# Patient Record
Sex: Female | Born: 1985 | Race: White | Hispanic: No | Marital: Married | Smoking: Never smoker
Health system: Southern US, Community
[De-identification: ages and names within clinical notes are randomized; demographics above are authoritative.]

## PROBLEM LIST (undated history)

## (undated) DIAGNOSIS — F329 Major depressive disorder, single episode, unspecified: Secondary | ICD-10-CM

## (undated) DIAGNOSIS — J302 Other seasonal allergic rhinitis: Secondary | ICD-10-CM

## (undated) DIAGNOSIS — R51 Headache: Secondary | ICD-10-CM

## (undated) DIAGNOSIS — E8881 Metabolic syndrome: Secondary | ICD-10-CM

## (undated) DIAGNOSIS — F419 Anxiety disorder, unspecified: Secondary | ICD-10-CM

## (undated) DIAGNOSIS — F32A Depression, unspecified: Secondary | ICD-10-CM

## (undated) DIAGNOSIS — K589 Irritable bowel syndrome without diarrhea: Secondary | ICD-10-CM

## (undated) DIAGNOSIS — E88819 Insulin resistance, unspecified: Secondary | ICD-10-CM

## (undated) DIAGNOSIS — I10 Essential (primary) hypertension: Secondary | ICD-10-CM

## (undated) DIAGNOSIS — N979 Female infertility, unspecified: Secondary | ICD-10-CM

## (undated) DIAGNOSIS — L509 Urticaria, unspecified: Secondary | ICD-10-CM

## (undated) HISTORY — DX: Female infertility, unspecified: N97.9

## (undated) HISTORY — DX: Major depressive disorder, single episode, unspecified: F32.9

## (undated) HISTORY — DX: Urticaria, unspecified: L50.9

## (undated) HISTORY — DX: Depression, unspecified: F32.A

## (undated) HISTORY — DX: Insulin resistance, unspecified: E88.819

## (undated) HISTORY — DX: Metabolic syndrome: E88.81

## (undated) HISTORY — PX: WISDOM TOOTH EXTRACTION: SHX21

## (undated) HISTORY — DX: Anxiety disorder, unspecified: F41.9

---

## 2005-07-22 ENCOUNTER — Ambulatory Visit: Payer: Self-pay | Admitting: Internal Medicine

## 2005-07-24 ENCOUNTER — Ambulatory Visit (HOSPITAL_COMMUNITY): Admission: RE | Admit: 2005-07-24 | Discharge: 2005-07-24 | Payer: Self-pay | Admitting: Internal Medicine

## 2005-08-19 ENCOUNTER — Ambulatory Visit: Payer: Self-pay | Admitting: *Deleted

## 2005-10-16 ENCOUNTER — Ambulatory Visit: Payer: Self-pay | Admitting: Family Medicine

## 2005-11-13 ENCOUNTER — Ambulatory Visit: Payer: Self-pay | Admitting: Family Medicine

## 2005-11-26 ENCOUNTER — Ambulatory Visit: Payer: Self-pay | Admitting: Family Medicine

## 2005-11-26 ENCOUNTER — Encounter: Payer: Self-pay | Admitting: Family Medicine

## 2005-12-24 ENCOUNTER — Ambulatory Visit: Payer: Self-pay | Admitting: Family Medicine

## 2005-12-31 ENCOUNTER — Ambulatory Visit: Payer: Self-pay | Admitting: Family Medicine

## 2006-02-27 ENCOUNTER — Ambulatory Visit: Payer: Self-pay | Admitting: Family Medicine

## 2006-03-26 ENCOUNTER — Ambulatory Visit: Payer: Self-pay | Admitting: Family Medicine

## 2006-04-09 ENCOUNTER — Ambulatory Visit: Payer: Self-pay | Admitting: Family Medicine

## 2006-07-30 ENCOUNTER — Ambulatory Visit: Payer: Self-pay | Admitting: Family Medicine

## 2006-08-25 ENCOUNTER — Ambulatory Visit: Payer: Self-pay | Admitting: Internal Medicine

## 2006-09-10 ENCOUNTER — Ambulatory Visit: Payer: Self-pay | Admitting: Family Medicine

## 2006-11-10 ENCOUNTER — Ambulatory Visit: Payer: Self-pay | Admitting: Family Medicine

## 2007-02-09 ENCOUNTER — Ambulatory Visit: Payer: Self-pay | Admitting: Family Medicine

## 2007-03-30 ENCOUNTER — Ambulatory Visit: Payer: Self-pay | Admitting: Family Medicine

## 2007-06-23 ENCOUNTER — Telehealth (INDEPENDENT_AMBULATORY_CARE_PROVIDER_SITE_OTHER): Payer: Self-pay | Admitting: *Deleted

## 2007-06-23 ENCOUNTER — Encounter (INDEPENDENT_AMBULATORY_CARE_PROVIDER_SITE_OTHER): Payer: Self-pay | Admitting: *Deleted

## 2007-11-22 ENCOUNTER — Emergency Department (HOSPITAL_COMMUNITY): Admission: EM | Admit: 2007-11-22 | Discharge: 2007-11-22 | Payer: Self-pay | Admitting: Family Medicine

## 2008-05-08 ENCOUNTER — Telehealth (INDEPENDENT_AMBULATORY_CARE_PROVIDER_SITE_OTHER): Payer: Self-pay | Admitting: Family Medicine

## 2008-06-06 ENCOUNTER — Ambulatory Visit: Payer: Self-pay | Admitting: Family Medicine

## 2008-06-06 DIAGNOSIS — K589 Irritable bowel syndrome without diarrhea: Secondary | ICD-10-CM | POA: Insufficient documentation

## 2008-06-06 DIAGNOSIS — L708 Other acne: Secondary | ICD-10-CM | POA: Insufficient documentation

## 2008-07-11 ENCOUNTER — Ambulatory Visit: Payer: Self-pay | Admitting: Family Medicine

## 2008-07-11 ENCOUNTER — Encounter (INDEPENDENT_AMBULATORY_CARE_PROVIDER_SITE_OTHER): Payer: Self-pay | Admitting: Family Medicine

## 2008-07-11 LAB — CONVERTED CEMR LAB
Chlamydia, DNA Probe: NEGATIVE
GC Probe Amp, Genital: NEGATIVE
Glucose, Urine, Semiquant: NEGATIVE
Nitrite: NEGATIVE
Protein, U semiquant: 30
Specific Gravity, Urine: 1.015
Urobilinogen, UA: 1
pH: 6.5

## 2008-07-14 ENCOUNTER — Telehealth (INDEPENDENT_AMBULATORY_CARE_PROVIDER_SITE_OTHER): Payer: Self-pay | Admitting: *Deleted

## 2008-07-19 ENCOUNTER — Encounter (INDEPENDENT_AMBULATORY_CARE_PROVIDER_SITE_OTHER): Payer: Self-pay | Admitting: Family Medicine

## 2008-08-08 ENCOUNTER — Encounter (INDEPENDENT_AMBULATORY_CARE_PROVIDER_SITE_OTHER): Payer: Self-pay | Admitting: *Deleted

## 2008-10-09 ENCOUNTER — Telehealth (INDEPENDENT_AMBULATORY_CARE_PROVIDER_SITE_OTHER): Payer: Self-pay | Admitting: *Deleted

## 2008-10-24 ENCOUNTER — Ambulatory Visit: Payer: Self-pay | Admitting: Family Medicine

## 2008-10-24 DIAGNOSIS — G43909 Migraine, unspecified, not intractable, without status migrainosus: Secondary | ICD-10-CM | POA: Insufficient documentation

## 2008-10-24 DIAGNOSIS — J329 Chronic sinusitis, unspecified: Secondary | ICD-10-CM | POA: Insufficient documentation

## 2009-07-03 ENCOUNTER — Ambulatory Visit: Payer: Self-pay | Admitting: Physician Assistant

## 2009-07-03 DIAGNOSIS — R5383 Other fatigue: Secondary | ICD-10-CM | POA: Insufficient documentation

## 2009-07-03 LAB — CONVERTED CEMR LAB
ALT: 30 units/L (ref 0–35)
AST: 30 units/L (ref 0–37)
Albumin: 4.5 g/dL (ref 3.5–5.2)
Alkaline Phosphatase: 82 units/L (ref 39–117)
BUN: 15 mg/dL (ref 6–23)
Basophils Absolute: 0 10*3/uL (ref 0.0–0.1)
Basophils Relative: 0 % (ref 0–1)
Blood Glucose, Fingerstick: 99
CO2: 24 meq/L (ref 19–32)
Calcium: 9.8 mg/dL (ref 8.4–10.5)
Chloride: 104 meq/L (ref 96–112)
Creatinine, Ser: 0.81 mg/dL (ref 0.40–1.20)
Eosinophils Absolute: 0.2 10*3/uL (ref 0.0–0.7)
Eosinophils Relative: 2 % (ref 0–5)
Glucose, Bld: 83 mg/dL (ref 70–99)
HCT: 38.9 % (ref 36.0–46.0)
Hemoglobin: 13.2 g/dL (ref 12.0–15.0)
Hgb A1c MFr Bld: 5.4 % (ref 4.6–6.1)
Lymphocytes Relative: 26 % (ref 12–46)
Lymphs Abs: 2.4 10*3/uL (ref 0.7–4.0)
MCHC: 33.9 g/dL (ref 30.0–36.0)
MCV: 87.8 fL (ref 78.0–100.0)
Monocytes Absolute: 0.7 10*3/uL (ref 0.1–1.0)
Monocytes Relative: 8 % (ref 3–12)
Neutro Abs: 6.1 10*3/uL (ref 1.7–7.7)
Neutrophils Relative %: 64 % (ref 43–77)
Platelets: 277 10*3/uL (ref 150–400)
Potassium: 4.7 meq/L (ref 3.5–5.3)
RBC: 4.43 M/uL (ref 3.87–5.11)
RDW: 12.3 % (ref 11.5–15.5)
Rapid HIV Screen: NEGATIVE
Sodium: 142 meq/L (ref 135–145)
TSH: 0.776 microintl units/mL (ref 0.350–4.500)
Total Bilirubin: 0.6 mg/dL (ref 0.3–1.2)
Total Protein: 7.3 g/dL (ref 6.0–8.3)
WBC: 9.4 10*3/uL (ref 4.0–10.5)

## 2009-07-04 ENCOUNTER — Encounter: Payer: Self-pay | Admitting: Physician Assistant

## 2009-08-02 ENCOUNTER — Ambulatory Visit: Payer: Self-pay | Admitting: Physician Assistant

## 2009-08-02 LAB — CONVERTED CEMR LAB: KOH Prep: NEGATIVE

## 2009-08-03 LAB — CONVERTED CEMR LAB
Chlamydia, DNA Probe: NEGATIVE
GC Probe Amp, Genital: NEGATIVE

## 2009-08-28 ENCOUNTER — Telehealth: Payer: Self-pay | Admitting: Physician Assistant

## 2009-09-16 ENCOUNTER — Encounter: Payer: Self-pay | Admitting: Physician Assistant

## 2009-10-02 ENCOUNTER — Ambulatory Visit: Payer: Self-pay | Admitting: Physician Assistant

## 2009-12-13 ENCOUNTER — Ambulatory Visit: Payer: Self-pay | Admitting: Physician Assistant

## 2009-12-13 DIAGNOSIS — R21 Rash and other nonspecific skin eruption: Secondary | ICD-10-CM | POA: Insufficient documentation

## 2009-12-13 DIAGNOSIS — N979 Female infertility, unspecified: Secondary | ICD-10-CM

## 2009-12-13 HISTORY — DX: Female infertility, unspecified: N97.9

## 2009-12-17 LAB — CONVERTED CEMR LAB
Anti Nuclear Antibody(ANA): NEGATIVE
Sed Rate: 16 mm/hr (ref 0–22)

## 2009-12-18 ENCOUNTER — Encounter: Payer: Self-pay | Admitting: Physician Assistant

## 2009-12-31 ENCOUNTER — Ambulatory Visit: Payer: Self-pay | Admitting: Physician Assistant

## 2010-01-22 ENCOUNTER — Ambulatory Visit: Payer: Self-pay | Admitting: Physician Assistant

## 2010-01-22 ENCOUNTER — Encounter (INDEPENDENT_AMBULATORY_CARE_PROVIDER_SITE_OTHER): Payer: Self-pay | Admitting: Internal Medicine

## 2010-03-08 ENCOUNTER — Encounter: Payer: Self-pay | Admitting: Physician Assistant

## 2010-04-01 ENCOUNTER — Telehealth: Payer: Self-pay | Admitting: Physician Assistant

## 2010-04-01 ENCOUNTER — Ambulatory Visit: Payer: Self-pay | Admitting: Physician Assistant

## 2010-08-20 ENCOUNTER — Ambulatory Visit: Payer: Self-pay | Admitting: Nurse Practitioner

## 2010-08-20 DIAGNOSIS — J309 Allergic rhinitis, unspecified: Secondary | ICD-10-CM | POA: Insufficient documentation

## 2010-10-11 ENCOUNTER — Encounter (INDEPENDENT_AMBULATORY_CARE_PROVIDER_SITE_OTHER): Payer: Self-pay | Admitting: Nurse Practitioner

## 2010-10-11 ENCOUNTER — Ambulatory Visit
Admission: RE | Admit: 2010-10-11 | Discharge: 2010-10-11 | Payer: Self-pay | Source: Home / Self Care | Attending: Nurse Practitioner | Admitting: Nurse Practitioner

## 2010-10-11 LAB — CONVERTED CEMR LAB
ALT: 24 units/L (ref 0–35)
AST: 22 units/L (ref 0–37)
Albumin: 4.7 g/dL (ref 3.5–5.2)
BUN: 11 mg/dL (ref 6–23)
Basophils Absolute: 0 10*3/uL (ref 0.0–0.1)
Basophils Relative: 0 % (ref 0–1)
Bilirubin Urine: NEGATIVE
Blood in Urine, dipstick: NEGATIVE
CO2: 23 meq/L (ref 19–32)
Calcium: 9.8 mg/dL (ref 8.4–10.5)
Chlamydia, DNA Probe: NEGATIVE
Chloride: 103 meq/L (ref 96–112)
Cholesterol: 157 mg/dL (ref 0–200)
Creatinine, Ser: 0.81 mg/dL (ref 0.40–1.20)
Eosinophils Relative: 1 % (ref 0–5)
GC Probe Amp, Genital: NEGATIVE
Glucose, Bld: 91 mg/dL (ref 70–99)
HCT: 41.8 % (ref 36.0–46.0)
HDL: 49 mg/dL (ref >39)
Hemoglobin: 14.1 g/dL (ref 12.0–15.0)
KOH Prep: NEGATIVE
Ketones, urine, test strip: NEGATIVE
LDL Cholesterol: 81 mg/dL (ref 0–99)
Lymphocytes Relative: 21 % (ref 12–46)
Lymphs Abs: 2.1 10*3/uL (ref 0.7–4.0)
MCHC: 33.7 g/dL (ref 30.0–36.0)
Monocytes Relative: 4 % (ref 3–12)
Neutro Abs: 7.2 10*3/uL (ref 1.7–7.7)
Neutrophils Relative %: 73 % (ref 43–77)
Nitrite: NEGATIVE
Platelets: 295 10*3/uL (ref 150–400)
Potassium: 4.6 meq/L (ref 3.5–5.3)
Protein, U semiquant: NEGATIVE
RBC: 4.7 M/uL (ref 3.87–5.11)
RDW: 13.1 % (ref 11.5–15.5)
Rapid HIV Screen: NEGATIVE
Sodium: 138 meq/L (ref 135–145)
Specific Gravity, Urine: 1.025
TSH: 1.066 microintl units/mL (ref 0.350–4.500)
Total Bilirubin: 0.7 mg/dL (ref 0.3–1.2)
Total CHOL/HDL Ratio: 3.2
Total Protein: 7.9 g/dL (ref 6.0–8.3)
Triglycerides: 133 mg/dL (ref <150)
Urobilinogen, UA: 0.2
VLDL: 27 mg/dL (ref 0–40)
WBC Urine, dipstick: NEGATIVE
pH: 6

## 2010-10-15 ENCOUNTER — Encounter (INDEPENDENT_AMBULATORY_CARE_PROVIDER_SITE_OTHER): Payer: Self-pay | Admitting: Nurse Practitioner

## 2010-11-07 NOTE — Letter (Signed)
Summary: [facility]   Imported By: Arta Bruce 09/18/2010 16:54:55  _____________________________________________________________________  External Attachment:    Type:   Image     Comment:   External Document

## 2010-11-07 NOTE — Assessment & Plan Note (Signed)
Summary: Complete Physical Exam   Vital Signs:  Patient profile:   25 year old female Menstrual status:  regular LMP:     09/27/2010 Weight:      182.0 pounds Temp:     99.4 degrees F oral Pulse rate:   76 / minute Pulse rhythm:   regular Resp:     20 per minute BP sitting:   130 / 100  (left arm) Cuff size:   regular  Vitals Entered By: Levon Hedger (October 11, 2010 9:10 AM) CC: CPP, Headache Is Patient Diabetic? No Pain Assessment Patient in pain? no       Does patient need assistance? Functional Status Self care Ambulation Normal  Vision Screening:Left eye w/o correction: 20 / 20-1 Right Eye w/o correction: 20 / 25-1 Both eyes w/o correction:  20/ 15        Vision Entered By: Levon Hedger (October 11, 2010 9:31 AM) LMP (date): 09/27/2010 LMP - Character: normal Menarche (age onset years): 10    Menstrual flow (days): 3 Menstrual Status regular Enter LMP: 09/27/2010 Last PAP Result  Specimen Adequacy: Satisfactory for evaluation.   Interpretation/Result:Negative for intraepithelial Lesion or Malignancy.      Primary Care Provider:  Tereso Newcomer PA-C  CC:  CPP and Headache.  History of Present Illness:  Pt into the office for a complete physical exam  PAP - last pap done 07/2008 with normal results no children but she has been trying for the past 3 years. Pt is sexually, no current birth control.  Married for 4 years. No condom use. Menses - monthly.  Started at age 67.  regular every month  Mammogram - great aunt with breast cancer no history of mammogram self breast exams at home  Optho - no glasses or contacts.  last eye exam was at age 39 no problems with eyes at this time  Dental - seen at [facility] clinic in November  Headache HPI:      The headaches generally will last anywhere from 15 minutes to 2 hours at a time.  She has approximately 5+ headaches per month.        The headaches are not associated with an aura.  The  location of the headaches are bilateral.        Additional history: Migraine about twice per month but every other day she has a nagging headache. No artificial sweetners.     Habits & Providers  Alcohol-Tobacco-Diet     Alcohol drinks/day: 0     Tobacco Status: never     Passive Smoke Exposure: no  Exercise-Depression-Behavior     Does Patient Exercise: yes     Type of exercise: walking     Times/week: <3     Have you felt down or hopeless? no     Have you felt little pleasure in things? no     Depression Counseling: not indicated; screening negative for depression     STD Risk: never     Drug Use: never     Seat Belt Use: always     Sun Exposure: occasionally  Comments: PHQ-9 score = 5  Allergies (verified): No Known Drug Allergies  Review of Systems General:  Complains of fatigue; denies fever and sleep disorder; drinks 1 cup of coffee daily . Eyes:  Denies blurring. ENT:  Denies earache. CV:  Denies chest pain or discomfort. Resp:  Denies cough. GI:  Denies abdominal pain. GU:  Denies abnormal vaginal bleeding. MS:  Denies joint pain. Derm:  Denies dryness. Neuro:  Complains of headaches. Psych:  Denies anxiety and depression. Endo:  Denies excessive urination.  Physical Exam  General:  alert.   Head:  normocephalic.   Eyes:  pupils round.   Ears:  bil TM with bony landmarkes present Nose:  no nasal discharge.   Mouth:  fair dentition.   Neck:  supple.   Chest Wall:  no mass.   Breasts:  no masses.   Lungs:  normal breath sounds.   Heart:  normal rate and regular rhythm.   Abdomen:  normal bowel sounds.   Rectal:  defer Msk:  up to the exam table Pulses:  R radial normal and L radial normal.   Extremities:  no edema Neurologic:  gait normal.   Skin:  color normal.   Psych:  Oriented X3.    Pelvic Exam  Vulva:      normal appearance.   Urethra and Bladder:      Urethra--normal.   Vagina:      physiologic discharge.   Cervix:       midposition.   Uterus:      anteverted.   Adnexa:      nontender bilaterally.   Rectum:      defer    Impression & Recommendations:  Problem # 1:  ROUTINE GYNECOLOGICAL EXAMINATION (ICD-V72.31) rec optho  dental up to date labs done PAP done Orders: T-Comprehensive Metabolic Panel [phone]) T-Lipid Profile ([id number]) T-CBC w/Diff (29562-13086) Rapid HIV  (57846) T-Syphilis Test (RPR) [phone]) T- GC Chlamydia (24401) T-TSH (02725-36644) Vision Screening (03474)  Problem # 2:  FATIGUE (ICD-780.79) will check for anemia  Problem # 3:  NEED PROPHYLACTIC VACCINATION&INOCULATION FLU (ICD-V04.81) given today in office  Complete Medication List: 1)  Retin-a 0.025 % Crea (Tretinoin) .... Apply to clean,dry face at bedtime 2)  One-a-day Extras Antioxidant Caps (Multiple vitamins-minerals) .... Take one by mouth daily 3)  Fish Oil Double Strength 1200 Mg Caps (Omega-3 fatty acids) .... Once daily 4)  Benzoyl Peroxide 2.5 % Gel (Benzoyl peroxide) .... Apply every morning 5)  Nasacort Aq 55 Mcg/act Aers (Triamcinolone acetonide) .... One spray in each nostril two times a day 6)  Loratadine 10 Mg Tabs (Loratadine) .... One tablet by mouth daily 7)  Cleocin-t 1 % Gel (Clindamycin phosphate) .... Apply to clean, dry face twice daily  Patient Instructions: 1)  Sleep - try to take some melatonin over the counter to help with sleep 2)  Fatigue - your labs will be checked for anemia 3)  You may also consider taking some over the counter vitamin D 1000 units by mouth two times a day as this is sometimes the cause for fatigue 4)  You will be notified of any abnormal labs 5)  You have been given the flu vaccine today 6)  Follow up yearly or as needed   Orders Added: 1)  T-Comprehensive Metabolic Panel [[id number]] 2)  T-Lipid Profile [80061-22930] 3)  T-CBC w/Diff [25956-38756] 4)  Rapid HIV  [92370] 5)  T-Syphilis Test (RPR) [43329-51884] 6)  T- GC Chlamydia  [16606] 7)  T-TSH [30160-10932] 8)  Est. Patient age 63-39 [84] 9)  Vision Screening [phone]    Laboratory Results   Urine Tests  Date/Time Received: October 11, 2010 9:29 AM   Routine Urinalysis   Color: lt. yellow Appearance: Clear Glucose: negative   (Normal Range: Negative) Bilirubin: negative   (Normal Range: Negative) Ketone: negative   (Normal Range: Negative) Spec. Gravity:  1.025   (Normal Range: 1.003-1.035) Blood: negative   (Normal Range: Negative) pH: 6.0   (Normal Range: 5.0-8.0) Protein: negative   (Normal Range: Negative) Urobilinogen: 0.2   (Normal Range: 0-1) Nitrite: negative   (Normal Range: Negative) Leukocyte Esterace: negative   (Normal Range: Negative)    Date/Time Received: October 11, 2010 10:41 AM   [place]et [place]/KOH Source: vaginal WBC/hpf: 1-5 Bacteria/hpf: rare Clue cells/hpf: none Yeast/hpf: none Trichomonas/hpf: none  Other Tests  Rapid HIV: negative    Prevention & Chronic Care Immunizations   Influenza vaccine: Fluvax 3+  (10/24/2008)    Tetanus booster: 11/13/2005: Td    Pneumococcal vaccine: Not documented  Other Screening   Pap smear:  Specimen Adequacy: Satisfactory for evaluation.   Interpretation/Result:Negative for intraepithelial Lesion or Malignancy.     (08/02/2009)   Smoking status: never  (10/11/2010)   Nursing Instructions: Give Flu vaccine today    Laboratory Results   Urine Tests    Routine Urinalysis   Color: lt. yellow Appearance: Clear Glucose: negative   (Normal Range: Negative) Bilirubin: negative   (Normal Range: Negative) Ketone: negative   (Normal Range: Negative) Spec. Gravity: 1.025   (Normal Range: 1.003-1.035) Blood: negative   (Normal Range: Negative) pH: 6.0   (Normal Range: 5.0-8.0) Protein: negative   (Normal Range: Negative) Urobilinogen: 0.2   (Normal Range: 0-1) Nitrite: negative   (Normal Range: Negative) Leukocyte Esterace: negative   (Normal Range: Negative)       Wet [place] Wet [place] KOH: Negative  Other Tests  Rapid HIV: negative   Appended Document: Complete Physical Exam     Allergies: No Known Drug Allergies   Complete Medication List: 1)  Retin-a 0.025 % Crea (Tretinoin) .... Apply to clean,dry face at bedtime 2)  One-a-day Extras Antioxidant Caps (Multiple vitamins-minerals) .... Take one by mouth daily 3)  Fish Oil Double Strength 1200 Mg Caps (Omega-3 fatty acids) .... Once daily 4)  Benzoyl Peroxide 2.5 % Gel (Benzoyl peroxide) .... Apply every morning 5)  Nasacort Aq 55 Mcg/act Aers (Triamcinolone acetonide) .... One spray in each nostril two times a day 6)  Loratadine 10 Mg Tabs (Loratadine) .... One tablet by mouth daily 7)  Cleocin-t 1 % Gel (Clindamycin phosphate) .... Apply to clean, dry face twice daily  Other Orders: Flu Vaccine 72yrs + (01027) Admin 1st Vaccine (25366)   Orders Added: 1)  Flu Vaccine 21yrs + [44034] 2)  Admin 1st Vaccine [74259]   Immunizations Administered:  Influenza Vaccine # 1:    Vaccine Type: Fluvax 3+    Site: left deltoid    Mfr: GlaxoSmithKline    Dose: 0.5 ml    Route: IM    Given by: Levon Hedger    Exp. Date: 04/05/2011    Lot #: [id number]    VIS given: 04/30/10 version given October 11, 2010.  Flu Vaccine Consent Questions:    Do you have a history of severe allergic reactions to this vaccine? no    Any prior history of allergic reactions to egg and/or gelatin? no    Do you have a sensitivity to the preservative Thimersol? no    Do you have a past history of Guillan-Barre Syndrome? no    Do you currently have an acute febrile illness? no    Have you ever had a severe reaction to latex? no    Vaccine information given and explained to patient? yes    Are you currently pregnant? no    ndc  [phone]  Immunizations Administered:  Influenza Vaccine # 1:    Vaccine Type: Fluvax 3+    Site: left deltoid    Mfr: GlaxoSmithKline    Dose: 0.5 ml    Route: IM     Given by: Levon Hedger    Exp. Date: 04/05/2011    Lot #: [id number]    VIS given: 04/30/10 version given October 11, 2010.

## 2010-11-07 NOTE — Letter (Signed)
Summary: GYNECOLOGIC CYTOLOGY REPORT  GYNECOLOGIC CYTOLOGY REPORT   Imported By: Arta Bruce 11/19/2009 15:17:35  _____________________________________________________________________  External Attachment:    Type:   Image     Comment:   External Document

## 2010-11-07 NOTE — Assessment & Plan Note (Signed)
Summary: Allergic Rhinitis   Vital Signs:  Patient profile:   25 year old female LMP:     08/06/2010 Weight:      186.5 pounds BMI:     32.13 Temp:     97.5 degrees F oral Pulse rate:   80 / minute Pulse rhythm:   regular Resp:     20 per minute BP sitting:   130 / 90  (left arm) Cuff size:   regular  Vitals Entered By: Levon Hedger (August 20, 2010 10:43 AM)  Nutrition Counseling: Patient's BMI is greater than 25 and therefore counseled on weight management options. CC: sinus pressure and pain...had a cold a couple of weeks ago and now the sinus pressure and pain won't go away Is Patient Diabetic? No  Does patient need assistance? Functional Status Self care Ambulation Normal LMP (date): 08/06/2010 LMP - Character: normal Menarche (age onset years): 10    Menstrual flow (days): 3 Enter LMP: 08/06/2010 Last PAP Result  Specimen Adequacy: Satisfactory for evaluation.   Interpretation/Result:Negative for intraepithelial Lesion or Malignancy.      Primary Care Deshon Koslowski:  Tereso Newcomer PA-C  CC:  sinus pressure and pain...had a cold a couple of weeks ago and now the sinus pressure and pain won't go away.  History of Present Illness:  Pt into the office with complaints of sinus pressure. Started with cold symptoms about 2 weeks ago for which she took over the counter nyquil and dayquil with relief of symptoms. However currently she has pressure behind her eyes +nasal congestion -sore throat -fever Pt has been using her netti pot at home and it is beneficial for about 30 minutes and then congestion returns  Obesity - down 7 pounds since her last visit.  She has been trying to increase her activity  Habits & Providers  Alcohol-Tobacco-Diet     Alcohol drinks/day: 0     Tobacco Status: never     Passive Smoke Exposure: no  Exercise-Depression-Behavior     Does Patient Exercise: yes     Type of exercise: walking     Times/week: <3     STD Risk: never  Drug Use: never     Seat Belt Use: always     Sun Exposure: occasionally  Allergies (verified): No Known Drug Allergies  Review of Systems General:  Denies fatigue and weakness. ENT:  Complains of nasal congestion; denies sore throat. CV:  Denies chest pain or discomfort. Resp:  Denies cough and wheezing. GI:  Denies nausea and vomiting.  Physical Exam  General:  alert.   Head:  normocephalic.   Ears:  bil TM with clear fluid present Nose:  turbinates inflammed Mouth:  pharynx pink and moist.   Lungs:  normal breath sounds.   Heart:  normal rate and regular rhythm.   Abdomen:  normal bowel sounds.   Msk:  up to the exam table Neurologic:  alert & oriented X3.     Impression & Recommendations:  Problem # 1:  ALLERGIC RHINITIS (ICD-477.9)  advised pt to continue to use her netti pot in between medication dosage will order nasal spray and loratadine 10mg  by mouth daily  Her updated medication list for this problem includes:    Nasacort Aq 55 Mcg/act Aers (Triamcinolone acetonide) ..... One spray in each nostril two times a day    Loratadine 10 Mg Tabs (Loratadine) ..... One tablet by mouth daily  Complete Medication List: 1)  Retin-a 0.025 % Crea (Tretinoin) .... Apply to clean,dry face  at bedtime 2)  One-a-day Extras Antioxidant Caps (Multiple vitamins-minerals) .... Take one by mouth daily 3)  Fish Oil Double Strength 1200 Mg Caps (Omega-3 fatty acids) .... Once daily 4)  Benzoyl Peroxide 2.5 % Gel (Benzoyl peroxide) .... Apply every morning 5)  Nasacort Aq 55 Mcg/act Aers (Triamcinolone acetonide) .... One spray in each nostril two times a day 6)  Loratadine 10 Mg Tabs (Loratadine) .... One tablet by mouth daily  Patient Instructions: 1)  You may continue to use the Netti pot 2)  Nasal spray - no samples available in office. 3)  start nasacort - 1 spray in each nostril two times a day (hold head down) 4)  Also take loratadine 10mg  by mouth daily  5)  Follow up as  needed Prescriptions: LORATADINE 10 MG TABS (LORATADINE) One tablet by mouth daily  #30 x 1   Entered and Authorized by:   Lehman Prom FNP   Signed by:   Lehman Prom FNP on 08/20/2010   Method used:   Print then Give to Patient   RxID:   [id number] NASACORT AQ 55 MCG/ACT AERS (TRIAMCINOLONE ACETONIDE) One spray in each nostril two times a day  #1 x 1   Entered and Authorized by:   Lehman Prom FNP   Signed by:   Lehman Prom FNP on 08/20/2010   Method used:   Print then Give to Patient   RxID:   [id number]    Orders Added: 1)  Est. Patient Level III [25366]

## 2010-11-07 NOTE — Assessment & Plan Note (Signed)
Summary: PIMPLE FACE//GK   Vital Signs:  Patient profile:   25 year old female Height:      64 inches Weight:      193 pounds BMI:     33.25 Temp:     98.4 degrees F oral Pulse rate:   94 / minute Pulse rhythm:   regular Resp:     18 per minute BP sitting:   127 / 88  (left arm) Cuff size:   regular  Vitals Entered By: [place] Shannon (April 01, 2010 4:30 PM) CC: pt is here for pimples... Is Patient Diabetic? No Pain Assessment Patient in pain? no       Does patient need assistance? Functional Status Self care Ambulation Normal   Primary Care Provider:  Tereso Newcomer PA-C  CC:  pt is here for pimples....  History of Present Illness: Here for acne.  Did go to derm clinic.  Cleared up after she was off Cleocin for a while.  Was told she had some type of allergy.  She never used because it went away.  Gets more severe acne.  Started using Cleocin a few weeks ago.  Noted some rash returning.    Current Medications (verified): 1)  Retin-A 0.025 % Crea (Tretinoin) .... Apply To Clean,dry Face At Bedtime 2)  Cleocin-T 1 % Gel (Clindamycin Phosphate) .... Apply To Clean,dry Face Two Times A Day 3)  One-A-Day Extras Antioxidant  Caps (Multiple Vitamins-Minerals) .... Take One By Mouth Daily 4)  Metamucil 0.52 Gm Caps (Psyllium) .... Take One By Mouth Once Daily 5)  Ibuprofen 600 Mg  Tabs (Ibuprofen) .... Take 1 Tablet By Mouth Every 8 Hours As Needed Fever/pain 6)  Fish Oil Double Strength 1200 Mg Caps (Omega-3 Fatty Acids) .... Once Daily 7)  Metrogel 1 % Gel (Metronidazole) .... Apply To Face Once Daily  Allergies (verified): No Known Drug Allergies  Physical Exam  General:  alert, well-developed, and well-nourished.   Head:  normocephalic and atraumatic.   Neurologic:  alert & oriented X3 and cranial nerves II-XII intact.   Skin:  some mild erythema of face noted over cheeks acne scarring noted on face  Psych:  normally interactive.     Impression &  Recommendations:  Problem # 1:  ACNE VULGARIS (ICD-706.1) stop cleocin try benzoyl peroxide f/u in 1 month could consider oral doxy but is category D with pregnancy and she wants to get pregnant   The following medications were removed from the medication list:    Cleocin-t 1 % Gel (Clindamycin phosphate) .Marland Kitchen... Apply to clean,dry face two times a day Her updated medication list for this problem includes:    Retin-a 0.025 % Crea (Tretinoin) .Marland Kitchen... Apply to clean,dry face at bedtime    Benzoyl Peroxide 2.5 % Gel (Benzoyl peroxide) .Marland Kitchen... Apply every morning  Complete Medication List: 1)  Retin-a 0.025 % Crea (Tretinoin) .... Apply to clean,dry face at bedtime 2)  One-a-day Extras Antioxidant Caps (Multiple vitamins-minerals) .... Take one by mouth daily 3)  Metamucil 0.52 Gm Caps (Psyllium) .... Take one by mouth once daily 4)  Ibuprofen 600 Mg Tabs (Ibuprofen) .... Take 1 tablet by mouth every 8 hours as needed fever/pain 5)  Fish Oil Double Strength 1200 Mg Caps (Omega-3 fatty acids) .... Once daily 6)  Benzoyl Peroxide 2.5 % Gel (Benzoyl peroxide) .... Apply every morning  Patient Instructions: 1)  Stop the Cleocin. 2)  Use the Benzoyl Peroxide every morning. 3)  Continue using the Retin A at bedtime.  4)  Schedule follow up with Hailey Stormer in 4-6 weeks for acne. Prescriptions: BENZOYL PEROXIDE 2.5 % GEL (BENZOYL PEROXIDE) Apply every morning  #1 bottle x 5   Entered and Authorized by:   Tereso Newcomer PA-C   Signed by:   Tereso Newcomer PA-C on 04/01/2010   Method used:   Print then Give to Patient   RxID:   [id number]

## 2010-11-07 NOTE — Assessment & Plan Note (Signed)
Summary: 2 week fu to discuss labs//kt   Vital Signs:  Patient profile:   25 year old female Height:      64 inches Weight:      192 pounds BMI:     33.08 Temp:     98.9 degrees F oral Pulse rate:   85 / minute Pulse rhythm:   regular Resp:     18 per minute BP sitting:   125 / 85  (left arm) Cuff size:   regular  Vitals Entered By: [place] Shannon (December 31, 2009 4:07 PM) CC: 2 week....pt wants to discuss rash problems on face..... Is Patient Diabetic? No Pain Assessment Patient in pain? no       Does patient need assistance? Functional Status Self care Ambulation Normal   Primary Care Provider:  Tereso Newcomer PA-C  CC:  2 week....pt wants to discuss rash problems on face.....Marland Kitchen  History of Present Illness: Here for f/u. ESR and ANA were both neg. Alt her facial prep's did not help.  Actually made her acne worse. No pruritus. No pain. No scaling No fevers or chills. No relation to her allergies.  No new soaps or facial creams.  Uses all hypoallergenic.   Current Medications (verified): 1)  Retin-A 0.025 % Crea (Tretinoin) .... Apply To Clean,dry Face At Bedtime 2)  Cleocin-T 1 % Gel (Clindamycin Phosphate) .... Apply To Clean,dry Face Two Times A Day 3)  One-A-Day Extras Antioxidant  Caps (Multiple Vitamins-Minerals) .... Take One By Mouth Daily 4)  Metamucil 0.52 Gm Caps (Psyllium) .... Take One By Mouth Once Daily 5)  Ibuprofen 600 Mg  Tabs (Ibuprofen) .... Take 1 Tablet By Mouth Every 8 Hours As Needed Fever/pain 6)  Fish Oil Double Strength 1200 Mg Caps (Omega-3 Fatty Acids) .... Once Daily  Allergies (verified): No Known Drug Allergies  Physical Exam  General:  alert, well-developed, and well-nourished.   Head:  normocephalic and atraumatic.   Neck:  supple.   Neurologic:  alert & oriented X3 and cranial nerves II-XII intact.   Skin:  maculopapular rash on face extending from forehead over labial folds and cheeks Psych:  normally interactive.      Impression & Recommendations:  Problem # 1:  MALAR RASH (ICD-782.1) looks more like rosacea today to me will have her stop the cleocin she can continue the retin-A will give her metrogel to use will also give her a Rx for doxy to use if her face is not clearing will set her up in the derm clinic if rash resolves with above, she can cancel the appt  Complete Medication List: 1)  Retin-a 0.025 % Crea (Tretinoin) .... Apply to clean,dry face at bedtime 2)  Cleocin-t 1 % Gel (Clindamycin phosphate) .... Apply to clean,dry face two times a day 3)  One-a-day Extras Antioxidant Caps (Multiple vitamins-minerals) .... Take one by mouth daily 4)  Metamucil 0.52 Gm Caps (Psyllium) .... Take one by mouth once daily 5)  Ibuprofen 600 Mg Tabs (Ibuprofen) .... Take 1 tablet by mouth every 8 hours as needed fever/pain 6)  Fish Oil Double Strength 1200 Mg Caps (Omega-3 fatty acids) .... Once daily 7)  Metrogel 1 % Gel (Metronidazole) .... Apply to face once daily 8)  Doxycycline Hyclate 50 Mg Caps (Doxycycline hyclate) .... Take 1 tablet by mouth two times a day for one week  Patient Instructions: 1)  Stop the cleocin. 2)  Continue the Retin-A. 3)  Start on the Metrogel and use once daily. 4)  If skin is not clearing, get the prescription for doxycycline filled and take until done. 5)  We will schedule you with the dermatology clinic. 6)  If your rash resolves with the above treatment, please cancel it. 7)  If it does not resolve, please go to the appt. 8)  Call if your rash gets worse. Prescriptions: DOXYCYCLINE HYCLATE 50 MG CAPS (DOXYCYCLINE HYCLATE) Take 1 tablet by mouth two times a day for one week  #14 x 0   Entered and Authorized by:   Tereso Newcomer PA-C   Signed by:   Tereso Newcomer PA-C on 12/31/2009   Method used:   Print then Give to Patient   RxID:   [id number] METROGEL 1 % GEL (METRONIDAZOLE) apply to face once daily  #1 bottle x 5   Entered and Authorized by:   Tereso Newcomer PA-C   Signed by:   Tereso Newcomer PA-C on 12/31/2009   Method used:   Print then Give to Patient   RxID:   [id number]

## 2010-11-07 NOTE — Letter (Signed)
Summary: Lipid Letter  [facility]  [address] [place], [place] [address]   Phone: [phone]  Fax: [phone]    10/15/2010  [name] [address] [place], [place]  [address]  Dear Eileen Rios:  We have carefully reviewed your last lipid profile from 10/11/2010 and the results are noted below with a summary of recommendations for lipid management.    Cholesterol:       157     Goal: less than 200   HDL "good" Cholesterol:   49     Goal: greater than 40   LDL "bad" Cholesterol:   81     Goal: less than 130   Triglycerides:       133     Goal: less than 150    Labs done during recent office visit are normal.  Pap Smear is normal.     Current Medications: 1)    Retin-a 0.025 % Crea (Tretinoin) .... Apply to clean,dry face at bedtime 2)    One-a-day Extras Antioxidant  Caps (Multiple vitamins-minerals) .... Take one by mouth daily 3)    Fish Oil Double Strength 1200 Mg Caps (Omega-3 fatty acids) .... Once daily 4)    Benzoyl Peroxide 2.5 % Gel (Benzoyl peroxide) .... Apply every morning 5)    Nasacort Aq 55 Mcg/act Aers (Triamcinolone acetonide) .... One spray in each nostril two times a day 6)    Loratadine 10 Mg Tabs (Loratadine) .... One tablet by mouth daily 7)    Cleocin-t 1 % Gel (Clindamycin phosphate) .... Apply to clean, dry face twice daily  If you have any questions, please call. We appreciate being able to work with you.   Sincerely,    Lehman Prom, FNP [facility]

## 2010-11-07 NOTE — Letter (Signed)
Summary: NOTICE OF PAYMENT /INFERTILITY CLINIC  NOTICE OF PAYMENT /INFERTILITY CLINIC   Imported By: Arta Bruce 03/11/2010 09:37:12  _____________________________________________________________________  External Attachment:    Type:   Image     Comment:   External Document

## 2010-11-07 NOTE — Letter (Signed)
Summary: DENTAL CLINIC  DENTAL CLINIC   Imported By: Arta Bruce 03/08/2010 09:24:03  _____________________________________________________________________  External Attachment:    Type:   Image     Comment:   External Document

## 2010-11-07 NOTE — Assessment & Plan Note (Signed)
Summary: FACE RASH//GK   Vital Signs:  Patient profile:   25 year old female Height:      64 inches Weight:      194 pounds BMI:     33.42 Temp:     98.3 degrees F oral Pulse rate:   69 / minute Pulse rhythm:   regular Resp:     18 per minute BP sitting:   127 / 84  (left arm) Cuff size:   regular  Vitals Entered By: [place] Shannon (December 13, 2009 4:01 PM) CC: pt is here for rash on face...Marland KitchenMarland Kitchen pt says she has not changed soaps or used any new product...Marland KitchenMarland Kitchen pt says it don't itch... pt says her face will get red but then goes back on its own.... Is Patient Diabetic? No Pain Assessment Patient in pain? no       Does patient need assistance? Functional Status Self care Ambulation Normal   CC:  pt is here for rash on face...Marland KitchenMarland Kitchen pt says she has not changed soaps or used any new product...Marland KitchenMarland Kitchen pt says it don't itch... pt says her face will get red but then goes back on its own.....  History of Present Illness: Here for rash on face. Present x 2 weeks. No pruritus.  No burning. Uses cleocin-T gel and Retin-A for acne. + fatigue no joint pain no new facial products or soaps acne well controlled  also wants referral for infertility  Current Medications (verified): 1)  Retin-A 0.025 % Crea (Tretinoin) .... Apply To Clean,dry Face At Bedtime 2)  Cleocin-T 1 % Gel (Clindamycin Phosphate) .... Apply To Clean,dry Face Two Times A Day 3)  One-A-Day Extras Antioxidant  Caps (Multiple Vitamins-Minerals) .... Take One By Mouth Daily 4)  Metamucil 0.52 Gm Caps (Psyllium) .... Take One By Mouth Once Daily 5)  Ibuprofen 600 Mg  Tabs (Ibuprofen) .... Take 1 Tablet By Mouth Every 8 Hours As Needed Fever/pain 6)  Fish Oil Double Strength 1200 Mg Caps (Omega-3 Fatty Acids) .... Once Daily  Allergies (verified): No Known Drug Allergies  Physical Exam  General:  alert, well-developed, and well-nourished.   Head:  normocephalic and atraumatic.   Neck:  supple.   Lungs:  normal breath sounds.    Heart:  normal rate and regular rhythm.   Abdomen:  soft and non-tender.   Neurologic:  alert & oriented X3 and cranial nerves II-XII intact.   Skin:  subtle erythematous macular rash on bilat cheeks no lesions noted  Psych:  normally interactive.     Impression & Recommendations:  Problem # 1:  MALAR RASH (ICD-782.1)  screen for lupus  Orders: T-Antinuclear Antib (ANA) ([id number]) T-Sed Rate (Automated) ([id number])  Problem # 2:  FEMALE INFERTILITY (ICD-628.9)  Orders: Gynecologic Referral (Gyn)  Complete Medication List: 1)  Retin-a 0.025 % Crea (Tretinoin) .... Apply to clean,dry face at bedtime 2)  Cleocin-t 1 % Gel (Clindamycin phosphate) .... Apply to clean,dry face two times a day 3)  One-a-day Extras Antioxidant Caps (Multiple vitamins-minerals) .... Take one by mouth daily 4)  Metamucil 0.52 Gm Caps (Psyllium) .... Take one by mouth once daily 5)  Ibuprofen 600 Mg Tabs (Ibuprofen) .... Take 1 tablet by mouth every 8 hours as needed fever/pain 6)  Fish Oil Double Strength 1200 Mg Caps (Omega-3 fatty acids) .... Once daily  Patient Instructions: 1)  Please schedule a follow-up appointment in 2 weeks with Zeus Marquis to go over blood work.

## 2010-11-07 NOTE — Progress Notes (Signed)
Summary: GYN referral for fertility   Phone Note Outgoing Call   Summary of Call: Eileen Rios,  Can you check into any other options for fertility for Eileen Rios?  The [facility] was too expensive.  Can we try [facility]? Initial call taken by: Brynda Rim,  April 01, 2010 5:21 PM  Follow-up for Phone Call        Pt thinks her husband needs to checked out, not her.She will look into getting him to a Urologist Follow-up by: Candi Leash,  April 05, 2010 4:04 PM

## 2010-11-07 NOTE — Progress Notes (Signed)
Summary: Office Visit//DEPRESSION SCREENING  Office Visit//DEPRESSION SCREENING   Imported By: Arta Bruce 10/14/2010 10:40:34  _____________________________________________________________________  External Attachment:    Type:   Image     Comment:   External Document

## 2011-06-27 LAB — CULTURE, ROUTINE-ABSCESS

## 2012-12-09 LAB — OB RESULTS CONSOLE ABO/RH: RH Type: POSITIVE

## 2012-12-09 LAB — OB RESULTS CONSOLE HEPATITIS B SURFACE ANTIGEN: Hepatitis B Surface Ag: NEGATIVE

## 2013-05-18 ENCOUNTER — Inpatient Hospital Stay (HOSPITAL_COMMUNITY): Payer: BC Managed Care – PPO

## 2013-05-18 ENCOUNTER — Encounter (HOSPITAL_COMMUNITY): Payer: Self-pay

## 2013-05-18 ENCOUNTER — Inpatient Hospital Stay (HOSPITAL_COMMUNITY)
Admission: AD | Admit: 2013-05-18 | Discharge: 2013-05-18 | Disposition: A | Discharge status: Home / Self Care | Payer: BC Managed Care – PPO | Source: Ambulatory Visit | Attending: Obstetrics & Gynecology | Admitting: Obstetrics & Gynecology

## 2013-05-18 DIAGNOSIS — O139 Gestational [pregnancy-induced] hypertension without significant proteinuria, unspecified trimester: Secondary | ICD-10-CM | POA: Insufficient documentation

## 2013-05-18 DIAGNOSIS — O133 Gestational [pregnancy-induced] hypertension without significant proteinuria, third trimester: Secondary | ICD-10-CM

## 2013-05-18 HISTORY — DX: Other seasonal allergic rhinitis: J30.2

## 2013-05-18 HISTORY — DX: Essential (primary) hypertension: I10

## 2013-05-18 HISTORY — DX: Irritable bowel syndrome, unspecified: K58.9

## 2013-05-18 HISTORY — DX: Headache: R51

## 2013-05-18 LAB — COMPREHENSIVE METABOLIC PANEL
ALT: 16 U/L (ref 0–35)
AST: 20 U/L (ref 0–37)
Albumin: 2.7 g/dL — ABNORMAL LOW (ref 3.5–5.2)
Alkaline Phosphatase: 107 U/L (ref 39–117)
BUN: 10 mg/dL (ref 6–23)
CO2: 22 mEq/L (ref 19–32)
Calcium: 9.7 mg/dL (ref 8.4–10.5)
Chloride: 101 mEq/L (ref 96–112)
Creatinine, Ser: 0.66 mg/dL (ref 0.50–1.10)
GFR calc Af Amer: >90 mL/min (ref >90)
GFR calc non Af Amer: >90 mL/min (ref >90)
Glucose, Bld: 71 mg/dL (ref 70–99)
Potassium: 4.4 mEq/L (ref 3.5–5.1)
Sodium: 134 mEq/L — ABNORMAL LOW (ref 135–145)
Total Bilirubin: 0.3 mg/dL (ref 0.3–1.2)
Total Protein: 6.4 g/dL (ref 6.0–8.3)

## 2013-05-18 LAB — CBC
HCT: 32.8 % — ABNORMAL LOW (ref 36.0–46.0)
Hemoglobin: 11.4 g/dL — ABNORMAL LOW (ref 12.0–15.0)
MCH: 28.9 pg (ref 26.0–34.0)
MCHC: 34.8 g/dL (ref 30.0–36.0)
MCV: 83 fL (ref 78.0–100.0)
Platelets: 223 10*3/uL (ref 150–400)
RBC: 3.95 MIL/uL (ref 3.87–5.11)
RDW: 12.5 % (ref 11.5–15.5)
WBC: 11.4 10*3/uL — ABNORMAL HIGH (ref 4.0–10.5)

## 2013-05-18 LAB — URIC ACID: Uric Acid, Serum: 4.5 mg/dL (ref 2.4–7.0)

## 2013-05-18 MED ORDER — LABETALOL HCL 100 MG PO TABS: 100.0000 mg | ORAL_TABLET | Freq: Two times a day (BID) | ORAL | Status: DC

## 2013-05-18 NOTE — MAU Provider Note (Signed)
  History     CSN: [id number]  Arrival date and time: 05/18/13 1652   None     Chief Complaint  Patient presents with  . Hypertension   HPI 31 wks G2P0 sent from office with new onset HTN, was called in for evaluation when she c/o increased LE swelling in the morning. Denies HA/CP/SOB/vision changes RUQ pain. No UCs/vaginal leaking/ bleeding. Feels good FMs when she lies down.  [facility] at [employer], no prior HTN hx   Past Medical History  Diagnosis Date  . Hypertension   . Headache(784.0)     migraines  . IBS (irritable bowel syndrome)   . Seasonal allergies     Past Surgical History  Procedure Laterality Date  . Wisdom tooth extraction      History reviewed. No pertinent family history.  History  Substance Use Topics  . Smoking status: Never Smoker   . Smokeless tobacco: Never Used  . Alcohol Use: No    Allergies: No Known Allergies  Prescriptions prior to admission  Medication Sig Dispense Refill  . acetaminophen (TYLENOL) 500 MG tablet Take 1,000 mg by mouth daily as needed for pain (For headache, back pain.).      Marland Kitchen clotrimazole-betamethasone (LOTRISONE) cream Apply 1 application topically 2 (two) times daily.      . cyclobenzaprine (FLEXERIL) 10 MG tablet Take 10 mg by mouth 3 (three) times daily as needed (For headache.).      Marland Kitchen loratadine (CLARITIN) 10 MG tablet Take 10 mg by mouth daily as needed for allergies.      . Magnesium 250 MG TABS Take 125 mg by mouth as needed (For cramps.).      Marland Kitchen Prenatal Vit-Fe Fumarate-FA (PRENATAL MULTIVITAMIN) TABS tablet Take 1 tablet by mouth daily at 12 noon.      . ranitidine (ZANTAC) 150 MG tablet Take 150 mg by mouth 2 (two) times daily.        ROS Physical Exam   Blood pressure 137/106, pulse 77, temperature 98 F (36.7 C), temperature source Oral, resp. rate 18, height 5\' 5"  (1.651 m), weight 216 lb 6.4 oz (98.158 kg), SpO2 98.00%.  Physical Exam A&O x 3, no acute distress. Pleasant HEENT neg Lungs CTA  bilat CV RRR, S1S2 normal Abdo soft, non tender, non acute, relaxed gravid Extr no edema/ tenderness, DTR +2/+2 bilat Pelvic deferred FHT  140s/ + accels. No decels/ moderate variability- Reactive NST Toco none  MAU Course  Procedures BPs- serially elevated. PIH labs neg.   Assessment and Plan  31.2 wks, elevated BP, gestational HTN, possible preeclampsia. PIH labs nl, BP high but stable, will send home with PO Labetalol 100mg  bid, increase as needed. Side effects esp HAs reviewed.  24 hr urine collection tomorrow and office visit with sono -growth on 8/15.  Strict modified bed rest, PEC warning symptoms reviewed, understands.   Baylea Milburn R 05/18/2013, 7:34 PM

## 2013-05-18 NOTE — MAU Note (Signed)
Patient was seen in the office today for regular appointment. Was sent to MAU for evaluation of elevated blood pressure. Patient denies headaches or visual problems but does increasing swelling. Reports normal fetal movement for her, a lot of movement one day and sluggish the next. Denies contractions, bleeding or leaking.

## 2013-05-29 ENCOUNTER — Inpatient Hospital Stay (HOSPITAL_COMMUNITY)
Admission: AD | Admit: 2013-05-29 | Discharge: 2013-05-29 | Disposition: A | Discharge status: Home / Self Care | Payer: BC Managed Care – PPO | Source: Ambulatory Visit | Attending: Obstetrics and Gynecology | Admitting: Obstetrics and Gynecology

## 2013-05-29 ENCOUNTER — Encounter (HOSPITAL_COMMUNITY): Payer: Self-pay | Admitting: *Deleted

## 2013-05-29 DIAGNOSIS — O133 Gestational [pregnancy-induced] hypertension without significant proteinuria, third trimester: Secondary | ICD-10-CM

## 2013-05-29 DIAGNOSIS — I10 Essential (primary) hypertension: Secondary | ICD-10-CM | POA: Diagnosis present

## 2013-05-29 DIAGNOSIS — R1013 Epigastric pain: Secondary | ICD-10-CM | POA: Diagnosis present

## 2013-05-29 DIAGNOSIS — R519 Headache, unspecified: Secondary | ICD-10-CM | POA: Diagnosis present

## 2013-05-29 DIAGNOSIS — O139 Gestational [pregnancy-induced] hypertension without significant proteinuria, unspecified trimester: Secondary | ICD-10-CM | POA: Insufficient documentation

## 2013-05-29 DIAGNOSIS — O99891 Other specified diseases and conditions complicating pregnancy: Secondary | ICD-10-CM | POA: Insufficient documentation

## 2013-05-29 DIAGNOSIS — K219 Gastro-esophageal reflux disease without esophagitis: Secondary | ICD-10-CM | POA: Insufficient documentation

## 2013-05-29 DIAGNOSIS — R51 Headache: Secondary | ICD-10-CM | POA: Insufficient documentation

## 2013-05-29 LAB — URINALYSIS, ROUTINE W REFLEX MICROSCOPIC
Bilirubin Urine: NEGATIVE
Glucose, UA: NEGATIVE mg/dL
Ketones, ur: NEGATIVE mg/dL
Nitrite: NEGATIVE
Protein, ur: 100 mg/dL — AB
Specific Gravity, Urine: 1.02 (ref 1.005–1.030)
Urobilinogen, UA: 0.2 mg/dL (ref 0.0–1.0)
pH: 7 (ref 5.0–8.0)

## 2013-05-29 LAB — CBC
HCT: 33.3 % — ABNORMAL LOW (ref 36.0–46.0)
Hemoglobin: 11.5 g/dL — ABNORMAL LOW (ref 12.0–15.0)
MCH: 29 pg (ref 26.0–34.0)
MCHC: 34.5 g/dL (ref 30.0–36.0)
MCV: 83.9 fL (ref 78.0–100.0)
Platelets: 250 10*3/uL (ref 150–400)
RBC: 3.97 MIL/uL (ref 3.87–5.11)
RDW: 12.8 % (ref 11.5–15.5)
WBC: 11.8 10*3/uL — ABNORMAL HIGH (ref 4.0–10.5)

## 2013-05-29 LAB — COMPREHENSIVE METABOLIC PANEL
ALT: 17 U/L (ref 0–35)
AST: 21 U/L (ref 0–37)
Albumin: 2.2 g/dL — ABNORMAL LOW (ref 3.5–5.2)
Alkaline Phosphatase: 122 U/L — ABNORMAL HIGH (ref 39–117)
BUN: 12 mg/dL (ref 6–23)
CO2: 19 mEq/L (ref 19–32)
Calcium: 9.2 mg/dL (ref 8.4–10.5)
Chloride: 104 mEq/L (ref 96–112)
Creatinine, Ser: 0.67 mg/dL (ref 0.50–1.10)
GFR calc Af Amer: >90 mL/min (ref >90)
GFR calc non Af Amer: >90 mL/min (ref >90)
Glucose, Bld: 79 mg/dL (ref 70–99)
Potassium: 4 mEq/L (ref 3.5–5.1)
Sodium: 135 mEq/L (ref 135–145)
Total Bilirubin: 0.3 mg/dL (ref 0.3–1.2)
Total Protein: 6 g/dL (ref 6.0–8.3)

## 2013-05-29 LAB — URINE MICROSCOPIC-ADD ON

## 2013-05-29 LAB — URIC ACID: Uric Acid, Serum: 5.7 mg/dL (ref 2.4–7.0)

## 2013-05-29 MED ORDER — HYDROXYZINE HCL 25 MG PO TABS
25.0000 mg | ORAL_TABLET | ORAL | Status: AC
  Administered 2013-05-29: 25 mg via ORAL
  Filled 2013-05-29: qty 1

## 2013-05-29 MED ORDER — NALBUPHINE SYRINGE 5 MG/0.5 ML
10.0000 mg | INJECTION | INTRAMUSCULAR | Status: AC
  Administered 2013-05-29: 10 mg via SUBCUTANEOUS
  Filled 2013-05-29: qty 1

## 2013-05-29 MED ORDER — NIFEDIPINE ER OSMOTIC RELEASE 30 MG PO TB24
30.0000 mg | ORAL_TABLET | Freq: Every day | ORAL | Status: DC
  Administered 2013-05-29 (×2): 30 mg via ORAL
  Filled 2013-05-29: qty 1

## 2013-05-29 MED ORDER — GI COCKTAIL ~~LOC~~
30.0000 mL | Freq: Once | ORAL | Status: AC
  Administered 2013-05-29: 30 mL via ORAL
  Filled 2013-05-29: qty 30

## 2013-05-29 MED ORDER — NIFEDIPINE ER OSMOTIC RELEASE 30 MG PO TB24: 30.0000 mg | ORAL_TABLET | Freq: Two times a day (BID) | ORAL | Status: DC

## 2013-05-29 NOTE — MAU Provider Note (Signed)
History     CSN: [id number]  Arrival date and time: 05/29/13 4098 Nurse notification of patient arrival / status @ [phone] Provider in to see patient @ [phone]    Chief Complaint  Patient presents with  . Hypertension  . Chest Pain   HPI  Came to MAU for migraine and elevated BP States BP better yesterday (130/88) after Procardia but developed mild headache Awoke at 0400 with severe migraine headache and elevated BP (150/110) and chest burning with pain Took Labetalol 400 mg dose at that time and came to hospital  HX migraine - none in pregnancy HX new onset hypertension @ 30 weeks of pregnancy c/w gestational hypertension HX GERD - takes Zantac 150 1-2 times per day with good control of symptoms   Past Medical History  Diagnosis Date  . Hypertension   . Headache(784.0)     migraines  . IBS (irritable bowel syndrome)   . Seasonal allergies     Past Surgical History  Procedure Laterality Date  . Wisdom tooth extraction      History reviewed. No pertinent family history.  History  Substance Use Topics  . Smoking status: Never Smoker   . Smokeless tobacco: Never Used  . Alcohol Use: No    Allergies: No Known Allergies  Prescriptions prior to admission  Medication Sig Dispense Refill  . labetalol (NORMODYNE) 100 MG tablet Take 200 mg by mouth every 8 (eight) hours.      Marland Kitchen NIFEdipine (PROCARDIA-XL/ADALAT-CC/NIFEDICAL-XL) 30 MG 24 hr tablet Take 30 mg by mouth daily.      . Prenatal Vit-Fe Fumarate-FA (PRENATAL MULTIVITAMIN) TABS tablet Take 1 tablet by mouth daily at 12 noon.      . ranitidine (ZANTAC) 150 MG tablet Take 150 mg by mouth 2 (two) times daily.      . [DISCONTINUED] labetalol (NORMODYNE) 100 MG tablet Take 1 tablet (100 mg total) by mouth 2 (two) times daily.  60 tablet  1  . clotrimazole-betamethasone (LOTRISONE) cream Apply 1 application topically 2 (two) times daily.      . cyclobenzaprine (FLEXERIL) 10 MG tablet Take 10 mg by mouth 3 (three) times  daily as needed (For headache.).      Marland Kitchen loratadine (CLARITIN) 10 MG tablet Take 10 mg by mouth daily as needed for allergies.      . Magnesium 250 MG TABS Take 125 mg by mouth as needed (For cramps.).      Took Labetalol 400mg  at 0430 ( takes 400mg  TID)  Took Procardia 30 XL yesterday ~ 10 am  ROS  Headache - dull yesterday afternoon worsening into the night / awoke with migraine around 0400 No vision disturbances / blurred vision No nausea or vomiting Epigastric pain and burning - hx GERD took zantac 150 last pm Chest pain present  Active FM Denies any contractions or cramping No vaginal bleeding  Physical Exam   Blood pressure 141/97, pulse 78, temperature 98 F (36.7 C), resp. rate 20, height 5\' 3"  (1.6 m), weight 99.247 kg (218 lb 12.8 oz), SpO2 97.00%.  Physical Exam  MAU Course  Procedures  Evaluate for pre-eclampsia Treat hypertension Headache management   Assessment and Plan   PIH labs ordered with urinalysis Analgesia / headache management:                 water hydration 500-102ml over next hour / Nubain 10 subQ / Vistaril 25 mg po Give dose Procardia 30 XL now - next dose Labetalol 400 mg @ noon  HX GERD - GI cocktail to differentiate epigastric pain from GERD  Consult with MD after labs resulted and reassessment of headache and epigastric pain if unresolved  Marlinda Mike 05/29/2013, 6:46 AM    RECHECK @ 0915 AM  Headache and epigastric pain resolved - feels much better Nubain and GI cocktail effective  BPs: 144/96 - 136/95 - 136/91 - 130/89 - 127/88  A/P: [redacted] weeks gestational hypertension         No evidence of pre-eclampsia         Headache - migraine versus medication induced         GERD  1) discharge home 2) continue Labetalol 400 TID (noon - 8pm - 4am) 3) increase procardia 30 XL to BID  (10am - 10pm) 4) continue zantac 150 BID 5) instruction to call office after-hours with any concerns 6) Keep ROB Thursday  Illianna Paschal [place],  [place] 05-29-2013 @ [phone]

## 2013-05-29 NOTE — MAU Note (Signed)
I have had hypertension for last couple wks. Awoke with h/a and took my b/p and was 174/119. My chest hurts

## 2013-05-29 NOTE — MAU Note (Addendum)
PT SAYS SHE  WENT TO BED WITH SLIGHT H/A-  TOOK  2 TYLENOL.  THEN SHE AWOKE AT 0400- WITH  A WORSE H/A.   SHE HAS HX OF ELEVATED BP- TAKES PROCARDIA 30DAILY AND LEBETALOL   200MG   -  2 TABS Q 8 HRS.Eileen Rios   DR MODY IN OFFICE

## 2013-05-29 NOTE — MAU Provider Note (Signed)
Agree with plan 

## 2013-06-03 ENCOUNTER — Inpatient Hospital Stay (HOSPITAL_COMMUNITY): Payer: BC Managed Care – PPO

## 2013-06-03 ENCOUNTER — Inpatient Hospital Stay (HOSPITAL_COMMUNITY)
Admission: AD | Admit: 2013-06-03 | Discharge: 2013-06-08 | DRG: 650 | Disposition: A | Discharge status: Home / Self Care | Payer: BC Managed Care – PPO | Source: Ambulatory Visit | Attending: Obstetrics & Gynecology | Admitting: Obstetrics & Gynecology

## 2013-06-03 ENCOUNTER — Encounter (HOSPITAL_COMMUNITY): Payer: Self-pay | Admitting: *Deleted

## 2013-06-03 DIAGNOSIS — D62 Acute posthemorrhagic anemia: Secondary | ICD-10-CM | POA: Diagnosis not present

## 2013-06-03 DIAGNOSIS — O1414 Severe pre-eclampsia complicating childbirth: Principal | ICD-10-CM | POA: Diagnosis present

## 2013-06-03 DIAGNOSIS — O9903 Anemia complicating the puerperium: Secondary | ICD-10-CM | POA: Diagnosis not present

## 2013-06-03 DIAGNOSIS — O132 Gestational [pregnancy-induced] hypertension without significant proteinuria, second trimester: Secondary | ICD-10-CM

## 2013-06-03 DIAGNOSIS — O321XX0 Maternal care for breech presentation, not applicable or unspecified: Secondary | ICD-10-CM | POA: Diagnosis present

## 2013-06-03 LAB — CBC
HCT: 33.1 % — ABNORMAL LOW (ref 36.0–46.0)
Platelets: 246 10*3/uL (ref 150–400)
RDW: 13.3 % (ref 11.5–15.5)
WBC: 11.1 10*3/uL — ABNORMAL HIGH (ref 4.0–10.5)

## 2013-06-03 LAB — COMPREHENSIVE METABOLIC PANEL
ALT: 16 U/L (ref 0–35)
AST: 22 U/L (ref 0–37)
Albumin: 2.2 g/dL — ABNORMAL LOW (ref 3.5–5.2)
Alkaline Phosphatase: 121 U/L — ABNORMAL HIGH (ref 39–117)
Chloride: 102 mEq/L (ref 96–112)
Potassium: 3.9 mEq/L (ref 3.5–5.1)
Total Bilirubin: 0.2 mg/dL — ABNORMAL LOW (ref 0.3–1.2)

## 2013-06-03 NOTE — MAU Note (Signed)
PT SAYS SHE WAS HERE  ON Sunday AM.    STOPPED PROCARDIA .    ON Thursday- WENT TOP REG OFFICE APPOINTMENT-  DID  24 HR URINE  AND U/S.      ON Friday- WENT TO DR Gillermina Hu   - CARDIOLOGIST-    CONTINUE MED  M    - - BUT CONTINUE  LABETALOL  UNTIL Monday THEN STOP.      NOW IN TRIAGE- NO H/A.  Marland Kitchen   TODAY  SHE DROPPED OFF URINE - THEY CALLED HER AT HOME- TOLD HER TO COME TO HOSPITAL

## 2013-06-04 ENCOUNTER — Inpatient Hospital Stay (HOSPITAL_COMMUNITY): Payer: BC Managed Care – PPO

## 2013-06-04 LAB — CBC
HCT: 34.1 % — ABNORMAL LOW (ref 36.0–46.0)
MCHC: 34 g/dL (ref 30.0–36.0)
MCV: 83.4 fL (ref 78.0–100.0)
RDW: 13.2 % (ref 11.5–15.5)

## 2013-06-04 LAB — COMPREHENSIVE METABOLIC PANEL
Albumin: 2.4 g/dL — ABNORMAL LOW (ref 3.5–5.2)
BUN: 13 mg/dL (ref 6–23)
Creatinine, Ser: 0.74 mg/dL (ref 0.50–1.10)
Total Bilirubin: 0.3 mg/dL (ref 0.3–1.2)
Total Protein: 6.4 g/dL (ref 6.0–8.3)

## 2013-06-04 LAB — URIC ACID: Uric Acid, Serum: 6.2 mg/dL (ref 2.4–7.0)

## 2013-06-04 MED ORDER — METHYLDOPA 500 MG PO TABS
500.0000 mg | ORAL_TABLET | Freq: Three times a day (TID) | ORAL | Status: DC
  Administered 2013-06-04 – 2013-06-05 (×4): 500 mg via ORAL
  Filled 2013-06-04 (×5): qty 1

## 2013-06-04 MED ORDER — LABETALOL HCL 200 MG PO TABS
200.0000 mg | ORAL_TABLET | Freq: Three times a day (TID) | ORAL | Status: DC
  Administered 2013-06-04 – 2013-06-05 (×4): 200 mg via ORAL
  Filled 2013-06-04 (×5): qty 1

## 2013-06-04 MED ORDER — ZOLPIDEM TARTRATE 5 MG PO TABS
5.0000 mg | ORAL_TABLET | Freq: Every evening | ORAL | Status: DC | PRN
  Administered 2013-06-04: 5 mg via ORAL
  Filled 2013-06-04: qty 1

## 2013-06-04 MED ORDER — MAGNESIUM SULFATE BOLUS VIA INFUSION
4.0000 g | Freq: Once | INTRAVENOUS | Status: AC
  Administered 2013-06-04: 4 g via INTRAVENOUS
  Filled 2013-06-04: qty 500

## 2013-06-04 MED ORDER — LACTATED RINGERS IV SOLN
INTRAVENOUS | Status: DC
  Administered 2013-06-04 – 2013-06-05 (×3): via INTRAVENOUS

## 2013-06-04 MED ORDER — BETAMETHASONE SOD PHOS & ACET 6 (3-3) MG/ML IJ SUSP
12.0000 mg | Freq: Every day | INTRAMUSCULAR | Status: DC
  Administered 2013-06-04: 12 mg via INTRAMUSCULAR
  Filled 2013-06-04: qty 2

## 2013-06-04 MED ORDER — FAMOTIDINE 20 MG PO TABS
20.0000 mg | ORAL_TABLET | Freq: Two times a day (BID) | ORAL | Status: DC
  Administered 2013-06-04 (×2): 20 mg via ORAL
  Filled 2013-06-04 (×2): qty 1

## 2013-06-04 MED ORDER — MAGNESIUM SULFATE 40 G IN LACTATED RINGERS - SIMPLE
2.0000 g/h | INTRAVENOUS | Status: DC
  Administered 2013-06-04 (×2): 2 g/h via INTRAVENOUS
  Filled 2013-06-04 (×2): qty 500

## 2013-06-04 MED ORDER — ACETAMINOPHEN 325 MG PO TABS
650.0000 mg | ORAL_TABLET | ORAL | Status: DC | PRN
  Administered 2013-06-04: 650 mg via ORAL
  Filled 2013-06-04: qty 2

## 2013-06-04 MED ORDER — BETAMETHASONE SOD PHOS & ACET 6 (3-3) MG/ML IJ SUSP
12.0000 mg | Freq: Two times a day (BID) | INTRAMUSCULAR | Status: AC
  Administered 2013-06-04: 12 mg via INTRAMUSCULAR
  Filled 2013-06-04: qty 2

## 2013-06-04 NOTE — Progress Notes (Signed)
Pt sitting up in the bed talking to visitors

## 2013-06-04 NOTE — Progress Notes (Signed)
Pt to ultrasound via wc.

## 2013-06-04 NOTE — Progress Notes (Signed)
Hospital day # 1 pregnancy at [redacted]w[redacted]d.  Severe Pre-Eclampsia on MgSO4.  Methyl-Dopa and Labetalol.                                                                 Betamethasone 1st dose at 2:11 am 8/30th, will give 2nd dose at 2 PM today.                                                                 Breech presentation confirmed by [place]  S: well, no H/A, no visual Sx, no epigastric pain.      Reports good fetal activity      Contractions:none      Vaginal bleeding:none now       Vaginal discharge: no significant change  O: BP 132/98  Pulse 87  Temp(Src) 98.1 F (36.7 C) (Oral)  Resp 20  Ht 5\' 4"  (1.626 m)  Wt 99.848 kg (220 lb 2 oz)  BMI 37.77 kg/m2      Fetal tracings:Fetal heart variability: moderate, accelerations present, no deceleration, Bline 120-130's, reviewed and reassuring      Uterus non-tender      Extremities: Mild Oedema.  DTRs 2+/4, no clonus       I/O -1118.8 cc (output 1600 cc 8/29-8/30)  A/P: [redacted]w[redacted]d with severe Pre-Eclampsia with severe HTN controled on Methyl-Dopa and Labetalol and Proteinuria > 3 g/24 hrs.  PEC labs still wnl otherwise including U. Acid, Plt and AST/ALT.  AGA per [place], monitoring of FHR reassuring.  Pending BPP.  Continue MgSO4, 2nd dose of Betamethasone at 2 PM today.  Repeat PEC labs in am.  Breech presentation.  Will be 34 0/7 wks 06/05/13, will proceed with C/S in am.  NPO at midnight today.      Argus Caraher,MARIE-LYNE  MD 06/04/2013 10:34 AM

## 2013-06-04 NOTE — H&P (Signed)
Subjective:  Eileen Rios is a 27 y.o. G2 P0 female with EDC 07/17/2013 at 79 and 6/[redacted] weeks gestation who is being admitted for Pre-eclampsia.  Her current obstetrical history is significant for pre-eclampsia.  Patient reports no complaints, no H/A, no visual Sx, no epigastric pain.   Fetal Movement: normal.   HPP:  Abrupt development of HTN x 31+ wks.  PEC labs wnl.  24 hr urine Prtn wnl on 05/20/13.  On Methyl-Dopa and Labetalol.  Seen by Cardio 06/03/13 because of severe gestational HTN abruptly worsening and difficult to control.  Per patient, no primary Cardiac concerns.  Will bring Consult letter in AM.  Plan was to decrease Labetalol because of Side effects including dizziness.    Objective:   Vital signs in last 24 hours: Temp:  [98.1 F (36.7 C)-98.5 F (36.9 C)] 98.5 F (36.9 C) (08/30 0139) Pulse Rate:  [62-85] 77 (08/30 0301) Resp:  [18-20] 18 (08/30 0301) BP: (133-169)/(86-106) 139/86 mmHg (08/30 0301) Weight:  [99.848 kg (220 lb 2 oz)] 99.848 kg (220 lb 2 oz) (08/29 2213)   General:   alert  Skin:   normal  HEENT:  PERRLA  Lungs:   clear to auscultation bilaterally  Heart:   regular rate and rhythm  Breasts:   Deferred  Abdomen:  normal findings: soft, non-tender, Gravid.  Breech confirmed by US  Pelvis:  Exam deferred.  FHT:  120-130's BPM  Good variability, accelerations present, no deceleration  Uterine Size: size equals dates  Presentations: breech  Cervix: Office at 31+ wks, will examine in am on rounds   Dilation: Closed   Effacement: Long   Station:     Consistency:    Position:                                No UC on monitoring                              Lower limbs:  Mild ankle oedema.  DTRs 2+/4 bilat.  No Clonus.  Lab Review  O, Rh+  AFP1:NML, Ultrascreen wnl.  [place] anato wnl  One hour GTT: Normal   PEC labs:  Uric Acid: 6.9                     Plt 246                     AST/ALT 22/16 Prtn g/24 hr from 8/28-8/29 at 3,152.1  Prtn g/24 hr from  05/20/2013 at 168.3  [place] 06/03/2013 at 11:25 PM  Breech presentation.  AFI wnl.  AGA.  BMethasone 1st dose at 2:11 am 06/04/2013.    Assessment/Plan:  33 and 6/[redacted] weeks gestation.  Breech presentation. Not in labor.  Fetal well-being reassuring. Obstetrical history significant for pre-eclampsia.  HTN on Methyl-Dopa and Labetalol with Proteinuria at 3,152.1 g/24 hrs.  PEC labs otherwise wnl.  B-Methasone x 2 doses.  MgSO4 started.  Will repeat PEC labs in am.  C/S breech/Pre-Eclampsia when B-Methasone completed or earlier if worsening PEC.      Risks, benefits, alternatives and possible complications have been discussed in detail with the patient.  Pre-admission, admission, and post admission procedures and expectations were discussed in detail.  All questions answered, all appropriate consents will be signed at the Hospital. Admission is planned for today.

## 2013-06-05 ENCOUNTER — Encounter (HOSPITAL_COMMUNITY)
Admission: AD | Disposition: A | Discharge status: Home / Self Care | Payer: Self-pay | Source: Ambulatory Visit | Attending: Obstetrics & Gynecology

## 2013-06-05 ENCOUNTER — Encounter (HOSPITAL_COMMUNITY): Payer: Self-pay | Admitting: *Deleted

## 2013-06-05 ENCOUNTER — Encounter (HOSPITAL_COMMUNITY): Payer: Self-pay | Admitting: Anesthesiology

## 2013-06-05 ENCOUNTER — Inpatient Hospital Stay (HOSPITAL_COMMUNITY): Payer: BC Managed Care – PPO | Admitting: Anesthesiology

## 2013-06-05 LAB — CBC
MCV: 83.7 fL (ref 78.0–100.0)
Platelets: 236 10*3/uL (ref 150–400)
RBC: 3.98 MIL/uL (ref 3.87–5.11)
RDW: 13.4 % (ref 11.5–15.5)
WBC: 14.9 10*3/uL — ABNORMAL HIGH (ref 4.0–10.5)

## 2013-06-05 LAB — COMPREHENSIVE METABOLIC PANEL
ALT: 15 U/L (ref 0–35)
AST: 20 U/L (ref 0–37)
Albumin: 2.3 g/dL — ABNORMAL LOW (ref 3.5–5.2)
CO2: 18 mEq/L — ABNORMAL LOW (ref 19–32)
Calcium: 7.9 mg/dL — ABNORMAL LOW (ref 8.4–10.5)
Chloride: 101 mEq/L (ref 96–112)
Creatinine, Ser: 0.74 mg/dL (ref 0.50–1.10)
GFR calc non Af Amer: >90 mL/min (ref >90)
Sodium: 133 mEq/L — ABNORMAL LOW (ref 135–145)
Total Bilirubin: 0.2 mg/dL — ABNORMAL LOW (ref 0.3–1.2)

## 2013-06-05 LAB — URIC ACID: Uric Acid, Serum: 7.1 mg/dL — ABNORMAL HIGH (ref 2.4–7.0)

## 2013-06-05 LAB — MAGNESIUM: Magnesium: 5.8 mg/dL — ABNORMAL HIGH (ref 1.5–2.5)

## 2013-06-05 SURGERY — Surgical Case
Anesthesia: Spinal | Site: Abdomen | Wound class: Clean Contaminated

## 2013-06-05 MED ORDER — KETOROLAC TROMETHAMINE 60 MG/2ML IM SOLN
INTRAMUSCULAR | Status: AC
  Administered 2013-06-05: 60 mg via INTRAMUSCULAR
  Filled 2013-06-05: qty 2

## 2013-06-05 MED ORDER — LABETALOL HCL 200 MG PO TABS
200.0000 mg | ORAL_TABLET | Freq: Three times a day (TID) | ORAL | Status: DC
  Filled 2013-06-05 (×2): qty 1

## 2013-06-05 MED ORDER — BUPIVACAINE HCL (PF) 0.25 % IJ SOLN
INTRAMUSCULAR | Status: AC
  Filled 2013-06-05: qty 30

## 2013-06-05 MED ORDER — WITCH HAZEL-GLYCERIN EX PADS
1.0000 "application " | MEDICATED_PAD | CUTANEOUS | Status: DC | PRN
  Administered 2013-06-06: 1 via TOPICAL

## 2013-06-05 MED ORDER — NALOXONE HCL 1 MG/ML IJ SOLN
1.0000 ug/kg/h | INTRAVENOUS | Status: DC | PRN
  Filled 2013-06-05: qty 2

## 2013-06-05 MED ORDER — DIPHENHYDRAMINE HCL 50 MG/ML IJ SOLN: 12.5000 mg | INTRAMUSCULAR | Status: DC | PRN

## 2013-06-05 MED ORDER — PHENYLEPHRINE 40 MCG/ML (10ML) SYRINGE FOR IV PUSH (FOR BLOOD PRESSURE SUPPORT)
PREFILLED_SYRINGE | INTRAVENOUS | Status: AC
  Filled 2013-06-05: qty 5

## 2013-06-05 MED ORDER — ONDANSETRON HCL 4 MG/2ML IJ SOLN
INTRAMUSCULAR | Status: AC
  Filled 2013-06-05: qty 2

## 2013-06-05 MED ORDER — MORPHINE SULFATE 0.5 MG/ML IJ SOLN
INTRAMUSCULAR | Status: AC
  Filled 2013-06-05: qty 10

## 2013-06-05 MED ORDER — ONDANSETRON HCL 4 MG/2ML IJ SOLN: 4.0000 mg | Freq: Three times a day (TID) | INTRAMUSCULAR | Status: DC | PRN

## 2013-06-05 MED ORDER — CITRIC ACID-SODIUM CITRATE 334-500 MG/5ML PO SOLN
ORAL | Status: AC
  Filled 2013-06-05: qty 15

## 2013-06-05 MED ORDER — NALBUPHINE SYRINGE 5 MG/0.5 ML
INJECTION | INTRAMUSCULAR | Status: AC
  Administered 2013-06-05: 10 mg via SUBCUTANEOUS
  Filled 2013-06-05: qty 1

## 2013-06-05 MED ORDER — OXYTOCIN 10 UNIT/ML IJ SOLN
40.0000 [IU] | INTRAMUSCULAR | Status: DC | PRN
  Administered 2013-06-05: 40 [IU] via INTRAVENOUS

## 2013-06-05 MED ORDER — ZOLPIDEM TARTRATE 5 MG PO TABS
5.0000 mg | ORAL_TABLET | Freq: Every evening | ORAL | Status: DC | PRN
  Administered 2013-06-07 – 2013-06-08 (×2): 5 mg via ORAL
  Filled 2013-06-05 (×2): qty 1

## 2013-06-05 MED ORDER — ONDANSETRON HCL 4 MG/2ML IJ SOLN
INTRAMUSCULAR | Status: DC | PRN
  Administered 2013-06-05: 4 mg via INTRAVENOUS

## 2013-06-05 MED ORDER — CITRIC ACID-SODIUM CITRATE 334-500 MG/5ML PO SOLN
30.0000 mL | Freq: Once | ORAL | Status: AC
  Administered 2013-06-05: 30 mL via ORAL

## 2013-06-05 MED ORDER — PRENATAL MULTIVITAMIN CH
1.0000 | ORAL_TABLET | Freq: Every day | ORAL | Status: DC
  Administered 2013-06-06 – 2013-06-08 (×3): 1 via ORAL
  Filled 2013-06-05 (×4): qty 1

## 2013-06-05 MED ORDER — LACTATED RINGERS IV SOLN
INTRAVENOUS | Status: DC
  Administered 2013-06-05: 23:00:00 via INTRAVENOUS

## 2013-06-05 MED ORDER — ONDANSETRON HCL 4 MG PO TABS: 4.0000 mg | ORAL_TABLET | ORAL | Status: DC | PRN

## 2013-06-05 MED ORDER — IBUPROFEN 600 MG PO TABS
600.0000 mg | ORAL_TABLET | Freq: Four times a day (QID) | ORAL | Status: DC
  Administered 2013-06-05 – 2013-06-08 (×12): 600 mg via ORAL
  Filled 2013-06-05 (×13): qty 1

## 2013-06-05 MED ORDER — FENTANYL CITRATE 0.05 MG/ML IJ SOLN
INTRAMUSCULAR | Status: DC | PRN
  Administered 2013-06-05: 25 ug via INTRATHECAL

## 2013-06-05 MED ORDER — NALBUPHINE HCL 10 MG/ML IJ SOLN
5.0000 mg | INTRAMUSCULAR | Status: DC | PRN
  Filled 2013-06-05: qty 1

## 2013-06-05 MED ORDER — METHYLDOPA 500 MG PO TABS
500.0000 mg | ORAL_TABLET | Freq: Three times a day (TID) | ORAL | Status: DC
  Filled 2013-06-05 (×3): qty 1

## 2013-06-05 MED ORDER — 0.9 % SODIUM CHLORIDE (POUR BTL) OPTIME
TOPICAL | Status: DC | PRN
  Administered 2013-06-05: 1000 mL

## 2013-06-05 MED ORDER — BUPIVACAINE HCL (PF) 0.25 % IJ SOLN
INTRAMUSCULAR | Status: DC | PRN
  Administered 2013-06-05: 10 mL

## 2013-06-05 MED ORDER — KETOROLAC TROMETHAMINE 30 MG/ML IJ SOLN: 30.0000 mg | Freq: Four times a day (QID) | INTRAMUSCULAR | Status: DC | PRN

## 2013-06-05 MED ORDER — FERROUS SULFATE 325 (65 FE) MG PO TABS
325.0000 mg | ORAL_TABLET | Freq: Two times a day (BID) | ORAL | Status: DC
  Administered 2013-06-05 – 2013-06-08 (×6): 325 mg via ORAL
  Filled 2013-06-05 (×6): qty 1

## 2013-06-05 MED ORDER — PHENYLEPHRINE HCL 10 MG/ML IJ SOLN
INTRAMUSCULAR | Status: DC | PRN
  Administered 2013-06-05: 20 ug via INTRAVENOUS
  Administered 2013-06-05 (×7): 40 ug via INTRAVENOUS

## 2013-06-05 MED ORDER — MAGNESIUM HYDROXIDE 400 MG/5ML PO SUSP
30.0000 mL | ORAL | Status: DC | PRN
  Filled 2013-06-05: qty 30

## 2013-06-05 MED ORDER — DIBUCAINE 1 % RE OINT: 1.0000 "application " | TOPICAL_OINTMENT | RECTAL | Status: DC | PRN

## 2013-06-05 MED ORDER — SIMETHICONE 80 MG PO CHEW
80.0000 mg | CHEWABLE_TABLET | Freq: Three times a day (TID) | ORAL | Status: DC
  Administered 2013-06-05 – 2013-06-08 (×11): 80 mg via ORAL

## 2013-06-05 MED ORDER — CEFAZOLIN SODIUM-DEXTROSE 2-3 GM-% IV SOLR
2.0000 g | Freq: Once | INTRAVENOUS | Status: AC
  Administered 2013-06-05: 2 g via INTRAVENOUS
  Filled 2013-06-05: qty 50

## 2013-06-05 MED ORDER — TETANUS-DIPHTH-ACELL PERTUSSIS 5-2.5-18.5 LF-MCG/0.5 IM SUSP
0.5000 mL | Freq: Once | INTRAMUSCULAR | Status: DC
  Filled 2013-06-05: qty 0.5

## 2013-06-05 MED ORDER — METOCLOPRAMIDE HCL 5 MG/ML IJ SOLN: 10.0000 mg | Freq: Three times a day (TID) | INTRAMUSCULAR | Status: DC | PRN

## 2013-06-05 MED ORDER — FENTANYL CITRATE 0.05 MG/ML IJ SOLN
INTRAMUSCULAR | Status: AC
  Filled 2013-06-05: qty 2

## 2013-06-05 MED ORDER — OXYCODONE-ACETAMINOPHEN 5-325 MG PO TABS
1.0000 | ORAL_TABLET | ORAL | Status: DC | PRN
  Administered 2013-06-06 – 2013-06-08 (×4): 1 via ORAL
  Filled 2013-06-05 (×4): qty 1

## 2013-06-05 MED ORDER — DIPHENHYDRAMINE HCL 25 MG PO CAPS: 25.0000 mg | ORAL_CAPSULE | Freq: Four times a day (QID) | ORAL | Status: DC | PRN

## 2013-06-05 MED ORDER — KETOROLAC TROMETHAMINE 60 MG/2ML IM SOLN
60.0000 mg | Freq: Once | INTRAMUSCULAR | Status: AC | PRN
  Administered 2013-06-05: 60 mg via INTRAMUSCULAR

## 2013-06-05 MED ORDER — DIPHENHYDRAMINE HCL 25 MG PO CAPS
25.0000 mg | ORAL_CAPSULE | ORAL | Status: DC | PRN
  Filled 2013-06-05: qty 1

## 2013-06-05 MED ORDER — SIMETHICONE 80 MG PO CHEW: 80.0000 mg | CHEWABLE_TABLET | ORAL | Status: DC | PRN

## 2013-06-05 MED ORDER — MENTHOL 3 MG MT LOZG: 1.0000 | LOZENGE | OROMUCOSAL | Status: DC | PRN

## 2013-06-05 MED ORDER — LANOLIN HYDROUS EX OINT
1.0000 | TOPICAL_OINTMENT | CUTANEOUS | Status: DC | PRN
Start: 2013-06-05 — End: 2013-06-08

## 2013-06-05 MED ORDER — MEPERIDINE HCL 25 MG/ML IJ SOLN: 6.2500 mg | INTRAMUSCULAR | Status: DC | PRN

## 2013-06-05 MED ORDER — SCOPOLAMINE 1 MG/3DAYS TD PT72: 1.0000 | MEDICATED_PATCH | Freq: Once | TRANSDERMAL | Status: DC

## 2013-06-05 MED ORDER — OXYTOCIN 40 UNITS IN LACTATED RINGERS INFUSION - SIMPLE MED: 62.5000 mL/h | INTRAVENOUS | Status: AC

## 2013-06-05 MED ORDER — OXYTOCIN 10 UNIT/ML IJ SOLN
INTRAMUSCULAR | Status: AC
  Filled 2013-06-05: qty 4

## 2013-06-05 MED ORDER — MAGNESIUM SULFATE 40 G IN LACTATED RINGERS - SIMPLE
2.0000 g/h | INTRAVENOUS | Status: DC
  Administered 2013-06-05: 2 g/h via INTRAVENOUS
  Filled 2013-06-05: qty 500

## 2013-06-05 MED ORDER — SENNOSIDES-DOCUSATE SODIUM 8.6-50 MG PO TABS
2.0000 | ORAL_TABLET | Freq: Every day | ORAL | Status: DC
  Administered 2013-06-05 – 2013-06-07 (×3): 2 via ORAL

## 2013-06-05 MED ORDER — MORPHINE SULFATE (PF) 0.5 MG/ML IJ SOLN
INTRAMUSCULAR | Status: DC | PRN
  Administered 2013-06-05: .15 mg via INTRATHECAL

## 2013-06-05 MED ORDER — DIPHENHYDRAMINE HCL 50 MG/ML IJ SOLN: 25.0000 mg | INTRAMUSCULAR | Status: DC | PRN

## 2013-06-05 MED ORDER — ONDANSETRON HCL 4 MG/2ML IJ SOLN: 4.0000 mg | INTRAMUSCULAR | Status: DC | PRN

## 2013-06-05 MED ORDER — NALOXONE HCL 0.4 MG/ML IJ SOLN: 0.4000 mg | INTRAMUSCULAR | Status: DC | PRN

## 2013-06-05 MED ORDER — NALBUPHINE HCL 10 MG/ML IJ SOLN
5.0000 mg | INTRAMUSCULAR | Status: DC | PRN
  Administered 2013-06-05: 10 mg via SUBCUTANEOUS
  Filled 2013-06-05: qty 1

## 2013-06-05 MED ORDER — HYDROMORPHONE HCL PF 1 MG/ML IJ SOLN: 0.2500 mg | INTRAMUSCULAR | Status: DC | PRN

## 2013-06-05 MED ORDER — SODIUM CHLORIDE 0.9 % IJ SOLN: 3.0000 mL | INTRAMUSCULAR | Status: DC | PRN

## 2013-06-05 MED ORDER — LABETALOL HCL 200 MG PO TABS
200.0000 mg | ORAL_TABLET | Freq: Every day | ORAL | Status: DC
  Filled 2013-06-05: qty 1

## 2013-06-05 SURGICAL SUPPLY — 35 items
CLAMP CORD UMBIL (MISCELLANEOUS) IMPLANT
CLOTH BEACON ORANGE TIMEOUT ST (SAFETY) ×2 IMPLANT
CONTAINER PREFILL 10% NBF 15ML (MISCELLANEOUS) IMPLANT
DRAPE LG THREE QUARTER DISP (DRAPES) ×2 IMPLANT
DRSG OPSITE POSTOP 4X10 (GAUZE/BANDAGES/DRESSINGS) ×2 IMPLANT
DURAPREP 26ML APPLICATOR (WOUND CARE) ×2 IMPLANT
ELECT REM PT RETURN 9FT ADLT (ELECTROSURGICAL) ×2
ELECTRODE REM PT RTRN 9FT ADLT (ELECTROSURGICAL) ×1 IMPLANT
EXTRACTOR VACUUM M CUP 4 TUBE (SUCTIONS) IMPLANT
GLOVE BIO SURGEON STRL SZ 6.5 (GLOVE) ×2 IMPLANT
GLOVE BIOGEL PI IND STRL 7.0 (GLOVE) ×3 IMPLANT
GLOVE BIOGEL PI INDICATOR 7.0 (GLOVE) ×3
GOWN STRL REIN XL XLG (GOWN DISPOSABLE) ×4 IMPLANT
KIT ABG SYR 3ML LUER SLIP (SYRINGE) ×2 IMPLANT
NDL HYPO 25X5/8 SAFETYGLIDE (NEEDLE) ×1 IMPLANT
NEEDLE HYPO 22GX1.5 SAFETY (NEEDLE) ×2 IMPLANT
NEEDLE HYPO 25X5/8 SAFETYGLIDE (NEEDLE) ×2 IMPLANT
PACK C SECTION WH (CUSTOM PROCEDURE TRAY) ×2 IMPLANT
PAD OB MATERNITY 4.3X12.25 (PERSONAL CARE ITEMS) ×2 IMPLANT
RTRCTR C-SECT PINK 25CM LRG (MISCELLANEOUS) ×2 IMPLANT
STAPLER VISISTAT 35W (STAPLE) ×1 IMPLANT
SUT PLAIN 0 NONE (SUTURE) IMPLANT
SUT PLAIN 2 0 XLH (SUTURE) ×2 IMPLANT
SUT VIC AB 0 CT1 27 (SUTURE) ×4
SUT VIC AB 0 CT1 27XBRD ANBCTR (SUTURE) ×2 IMPLANT
SUT VIC AB 0 CTX 36 (SUTURE) ×4
SUT VIC AB 0 CTX36XBRD ANBCTRL (SUTURE) ×2 IMPLANT
SUT VIC AB 2-0 CT1 27 (SUTURE) ×2
SUT VIC AB 2-0 CT1 TAPERPNT 27 (SUTURE) ×1 IMPLANT
SUT VIC AB 3-0 SH 27 (SUTURE)
SUT VIC AB 3-0 SH 27X BRD (SUTURE) IMPLANT
SYR CONTROL 10ML LL (SYRINGE) ×2 IMPLANT
TOWEL OR 17X24 6PK STRL BLUE (TOWEL DISPOSABLE) ×2 IMPLANT
TRAY FOLEY CATH 14FR (SET/KITS/TRAYS/PACK) ×2 IMPLANT
WATER STERILE IRR 1000ML POUR (IV SOLUTION) ×4 IMPLANT

## 2013-06-05 NOTE — Progress Notes (Signed)
Hospital day # 2 pregnancy at [redacted]w[redacted]d  Severe PEC on MgSO4/Bmethasone x 2 received/MethylDopa/Labetalol  S: well, reports good fetal activity      Contractions:none      Vaginal bleeding:none now       Vaginal discharge: no significant change  O: BP 140/93  Pulse 91  Temp(Src) 97.9 F (36.6 C) (Oral)  Resp 18  Ht 5\' 4"  (1.626 m)  Wt 99.848 kg (220 lb 2 oz)  BMI 37.77 kg/m2      Fetal tracings:Fetal heart variability: moderate, no deceleration. reviewed and reassuring      Uterus gravid and non-tender      Extremities: Mild ankle oedema  Labs 06/05/13:  Plt 236                         AST/ALT 20/15                         U. Acid 7.1] Obtained report confirmation of 24 hr Prtn at >3 g on 06/03/2013        BPP 6/8, 8/10 with NST, Breach on 06/04/2013.  A: [redacted]w[redacted]d with severe PEC, Bmethasone x 2 completed, breach presentation.     Fetal well-being reassuring.  P: Primary C/S now.  Informed consent obtained.  Surgery and risks reviewed including but not restricted to trauma, hemorrhage/Blood transfusion, DVT/PE, infection, anesthesia Cx.  Froylan Hobby,MARIE-LYNE  MD 06/05/2013 9:36 AM

## 2013-06-05 NOTE — Anesthesia Postprocedure Evaluation (Signed)
  Anesthesia Post-op Note  Patient: Eileen Rios  Procedure(s) Performed: Procedure(s): Primary cesarean section with delivery of baby boy at 9.  Apgars 1/8. (N/A)  Patient is awake, responsive, moving her legs, and has signs of resolution of her numbness. Pain and nausea are reasonably well controlled. Vital signs are stable and clinically acceptable. Oxygen saturation is clinically acceptable. There are no apparent anesthetic complications at this time. Patient is ready for discharge.

## 2013-06-05 NOTE — Progress Notes (Signed)
Pt up to the bathroom and then to stretcher and taken to OR 9

## 2013-06-05 NOTE — Op Note (Signed)
Preoperative diagnosis: Intrauterine pregnancy at 34 weeks and 0 day                                            Severe Pre-Eclampsia.  Breech presentation.  Post operative diagnosis: Same  Anesthesia: Spinal  Anesthesiologist: Dr. Cristela Blue  Procedure: Primary low transverse cesarean section  Surgeon: Dr. Genia Del  Assistant: none   Estimated blood loss: 800 cc  Procedure:  After being informed of the planned procedure and possible complications including bleeding, infection, injury to other organs, informed consent is obtained. The patient is taken to OR #9 and given spinal anesthesia without complication. She is placed in the dorsal decubitus position with the pelvis tilted to the left. She is then prepped and draped in a sterile fashion. A Foley catheter is inserted in her bladder.  After assessing adequate level of anesthesia, we infiltrate the suprapubic area with 20 cc of Marcaine 0.25 and perform a Pfannenstiel incision which is brought down sharply to the fascia. The fascia is entered in a low transverse fashion. Linea alba is dissected. Peritoneum is entered in a midline fashion. An Alexis retractor is easily positioned. Visceral peritoneum is entered in a low transverse fashion allowing [place] to safely retract bladder by developing a bladder flap.  The myometrium is then entered in a low transverse fashion; first with knife and then extended bluntly. Amniotic fluid is clear. We assist the birth of a female  infant in frank breech presentation. Mouth and nose are suctioned. The baby is delivered. The cord is clamped and sectioned. The baby is given to the neonatologist present in the room.  10 cc of blood is drawn from the umbilical vein, a gas is also done. The placenta is allowed to deliver spontaneously. It is complete and the cord has 3 vessels. Uterine revision is negative.  We proceed with closure of the myometrium in 2 layers: First with a running locked suture of 0  Vicryl, then with a Lembert suture of 0 Vicryl imbricating the first one. Hemostasis is completed with cauterization on peritoneal edges.  Both paracolic gutters are cleaned. Both tubes and ovaries are assessed and normal.  We confirm satisfactory hemostasis.  Retractors and sponges are removed. Under fascia hemostasis is completed with cauterization.  The parietal peritoneum is closed with a running suture of Vicryl 2-0.  The fascia is then closed with 2 running sutures of 0 Vicryl meeting midline. The wound is irrigated with warm saline and hemostasis is completed with cauterization.  The adipose tissue is closed with a running suture of Plain 2-0. The skin is closed with staples.  A Honeycomb dressing is applied.  Instrument and sponge count is complete x2. Estimated blood loss is 800 cc.  The procedure is well tolerated by the patient who is taken to recovery room in a well and stable condition.  female baby named Redmond Baseman was born at 10:33 am and received an Apgar of 1  at 1 minute and 8 at 5 minutes. Weight was pending.   Specimen: Placenta sent to Patho.   Karyss Frese,MARIE-LYNE MD 8/31/201411:21 AM

## 2013-06-05 NOTE — Transfer of Care (Signed)
Immediate Anesthesia Transfer of Care Note  Patient: Eileen Rios  Procedure(s) Performed: Procedure(s): Primary cesarean section with delivery of baby boy at 67.  Apgars 1/8. (N/A)  Patient Location: PACU  Anesthesia Type:Spinal  Level of Consciousness: awake and alert   Airway & Oxygen Therapy: Patient Spontanous Breathing  Post-op Assessment: Report given to PACU RN and Post -op Vital signs reviewed and stable  Post vital signs: Reviewed and stable  Complications: No apparent anesthesia complications

## 2013-06-05 NOTE — OR Nursing (Addendum)
Uterus massaged by S. Eunice Oldaker Charity fundraiser. 3cc of blood evacuated from uterus during uterine massage. Two tubes of  Cord blood sent to lab.

## 2013-06-05 NOTE — Anesthesia Procedure Notes (Signed)

## 2013-06-05 NOTE — Anesthesia Preprocedure Evaluation (Addendum)
Anesthesia Evaluation  Patient identified by MRN, date of birth, ID band Patient awake    Reviewed: Allergy & Precautions, H&P , Patient's Chart, lab work & pertinent test results  Airway Mallampati: II  TM Distance: >3 FB Neck ROM: full    Dental no notable dental hx.    Pulmonary  breath sounds clear to auscultation  Pulmonary exam normal       Cardiovascular Exercise Tolerance: Good hypertension, On Medications Rhythm:regular Rate:Normal     Neuro/Psych    GI/Hepatic   Endo/Other  Morbid obesity  Renal/GU      Musculoskeletal   Abdominal   Peds  Hematology   Anesthesia Other Findings   Reproductive/Obstetrics                             Anesthesia Physical Anesthesia Plan  ASA: III  Anesthesia Plan: Spinal   Post-op Pain Management:    Induction:   Airway Management Planned:   Additional Equipment:   Intra-op Plan:   Post-operative Plan:   Informed Consent: I have reviewed the patients History and Physical, chart, labs and discussed the procedure including the risks, benefits and alternatives for the proposed anesthesia with the patient or authorized representative who has indicated his/her understanding and acceptance.   Dental Advisory Given  Plan Discussed with: CRNA  Anesthesia Plan Comments: (Lab work confirmed with CRNA in room. Platelets okay. Discussed spinal anesthetic, and patient consents to the procedure:  included risk of possible headache,backache, failed block, allergic reaction, and nerve injury. This patient was asked if she had any questions or concerns before the procedure started. )        Anesthesia Quick Evaluation  

## 2013-06-05 NOTE — Lactation Note (Signed)
This note was copied from the chart of Boy Asuna Peth. Lactation Consultation Note   Initial consult with this mom, now 5 hours post par4tum, in AICU on mag drip, and drowsy. Baby is a [redacted] week gestation NICU baby. Basic teaching done  On pumping with DEP, and brief teaching on providing breast milk for NICU baby begun. Lactation services briefly reviewed. Mom knows to call for questions/concerns.  Patient Name: Boy Dewayne Jurek [id number] Date: 06/05/2013 Reason for consult: Initial assessment;NICU baby;Late preterm infant   Maternal Data Formula Feeding for Exclusion: Yes (baby in NICU) Reason for exclusion: Mother's choice to formula and breast feed on admission Infant to breast within first hour of birth: No Breastfeeding delayed due to:: Infant status Has patient been taught Hand Expression?: Yes Does the patient have breastfeeding experience prior to this delivery?: No  Feeding    LATCH Score/Interventions                      Lactation Tools Discussed/Used Tools: Pump Breast pump type: Double-Electric Breast Pump WIC Program: No Pump Review: Setup, frequency, and cleaning;Milk Storage;Other (comment) (hand exp. teaching from NICU booklet on EBM) Initiated by:: c ll rn not within 5 hours of elivery Date initiated:: 06/05/13 (at 4 pm)   Consult Status Consult Status: Follow-up Date: 06/06/13 Follow-up type: In-patient    Alfred Levins 06/05/2013, 4:16 PM

## 2013-06-06 ENCOUNTER — Encounter (HOSPITAL_COMMUNITY): Payer: Self-pay | Admitting: Obstetrics & Gynecology

## 2013-06-06 LAB — CBC
Hemoglobin: 8.9 g/dL — ABNORMAL LOW (ref 12.0–15.0)
MCH: 28.8 pg (ref 26.0–34.0)
MCHC: 33.8 g/dL (ref 30.0–36.0)
Platelets: 214 10*3/uL (ref 150–400)
RDW: 13.6 % (ref 11.5–15.5)

## 2013-06-06 LAB — COMPREHENSIVE METABOLIC PANEL
ALT: 13 U/L (ref 0–35)
Albumin: 1.9 g/dL — ABNORMAL LOW (ref 3.5–5.2)
Calcium: 6.9 mg/dL — ABNORMAL LOW (ref 8.4–10.5)
GFR calc Af Amer: >90 mL/min (ref >90)
Glucose, Bld: 93 mg/dL (ref 70–99)
Potassium: 4.9 mEq/L (ref 3.5–5.1)
Sodium: 133 mEq/L — ABNORMAL LOW (ref 135–145)
Total Protein: 5 g/dL — ABNORMAL LOW (ref 6.0–8.3)

## 2013-06-06 LAB — MAGNESIUM: Magnesium: 5.8 mg/dL — ABNORMAL HIGH (ref 1.5–2.5)

## 2013-06-06 MED ORDER — LABETALOL HCL 100 MG PO TABS
100.0000 mg | ORAL_TABLET | Freq: Three times a day (TID) | ORAL | Status: DC
  Administered 2013-06-07 – 2013-06-08 (×5): 100 mg via ORAL
  Filled 2013-06-06 (×6): qty 1

## 2013-06-06 NOTE — Anesthesia Postprocedure Evaluation (Signed)
  Anesthesia Post-op Note  Patient: Eileen Rios  Procedure(s) Performed: Procedure(s): Primary cesarean section with delivery of baby boy at 89.  Apgars 1/8. (N/A)  Patient Location: PACU and A-ICU  Anesthesia Type:Spinal  Level of Consciousness: awake, alert  and oriented  Airway and Oxygen Therapy: Patient Spontanous Breathing  Post-op Pain: mild  Post-op Assessment: Patient's Cardiovascular Status Stable, Respiratory Function Stable, No signs of Nausea or vomiting, Adequate PO intake, Pain level controlled, No headache, No backache, No residual numbness and No residual motor weakness  Post-op Vital Signs: stable  Complications: No apparent anesthesia complications

## 2013-06-06 NOTE — Progress Notes (Signed)
Patient ID: Eileen Rios, female   DOB: [birth year], 27 y.o.   MRN: [id number] BP reviewed, off Labetalol, will restart at 100 mg TID with hold criteria if <140/90. Feels well, some neck pain and head pressure/tension, cold compresses, hydration, PO pain meds as needed. NICU baby, stable.  V.Hanzel Pizzo, MD

## 2013-06-06 NOTE — Progress Notes (Addendum)
Contacted Dr. Seymour Bars. Left message for call to be returned to [place], Charity fundraiser, BSN, [place], [phone]. Faith Patricelli, [place], RN, BSN 3:44 PM

## 2013-06-06 NOTE — Progress Notes (Signed)
UR chart review completed.  

## 2013-06-06 NOTE — Progress Notes (Signed)
Contacted Dr. Juliene Pina. Notified of patient BP; BP had decreased from initial BP at 1435. Mody said to monitor patient and would come assess this evening, and make any changes needed.  Clinton Sawyer [place], RN, BSN 4:10 PM

## 2013-06-06 NOTE — Progress Notes (Signed)
I visited with Eileen Rios and her family while making rounds on the unit.  They were in good spirits and were grateful that their baby, [name], is doing well.  They did not report any needs at this time, but are aware of availability of on-going chaplain support.   [employer] Pager, [phone] 1:44 PM   06/06/13 1300  Clinical Encounter Type  Visited With Patient and family together  Visit Type Initial

## 2013-06-06 NOTE — Progress Notes (Signed)
POD#1 C/S severe PEC at 34 wks.  On MgSO4.  No anti-HTN med needed postop              BB boy in NICU, doing well per patient.  S:   Feels good/pain controled with Percocet/tolerating po/N/V none/Ambulating well/Voiding well/Flatus present  Breast wnl  O: A&O x 3/ Filed Vitals:   06/06/13 0900  BP: 120/81  Pulse:   Temp:   Resp:      Labs: Lab Results  Component Value Date   WBC 16.4* 06/06/2013   HGB 8.9* 06/06/2013   HCT 26.3* 06/06/2013   MCV 85.1 06/06/2013   PLT 214 06/06/2013      Intake/Output Summary (Last 24 hours) at 06/06/13 0946 Last data filed at 06/06/13 0900  Gross per 24 hour  Intake 5644.79 ml  Output   3330 ml  Net 2314.79 ml     Lab Results  Component Value Date   WBC 16.4* 06/06/2013   HGB 8.9* 06/06/2013   HCT 26.3* 06/06/2013   MCV 85.1 06/06/2013   PLT 214 06/06/2013         AST/ALT 19/13       Uric acid 6.5   Lungs: clear  Heart: rcr  Abdomen: soft/Bowel sounds: present/Dressing: dry with minor old blood in center/Insicion: intact  Lochia: normal  Extremities: normal, no oedema           no calf pain/tenderness                         A/P: POD1 C/S/G2P0111 severe PEC resolving with normal BPs without anti-HTN meds             D/C to normal PP floor.  D/C MgSO4.  D/C anti-HTN meds.  Anemia, Iron Sulfate.   Genia Del MD  06/06/2013 at 9:50 am

## 2013-06-07 LAB — RPR: RPR Ser Ql: NONREACTIVE

## 2013-06-07 NOTE — Progress Notes (Signed)
Subjective: Postpartum Day 2, Primary Cesarean Delivery for Breech due to severe Preeclampsia at 34 wks. Baby stable in NICU. Circ desired, will schedule after NICU clearance.  Patient reports tolerating PO and no problems voiding. Minimal incisional pain. She noted chills, shakes, not feeling well and some HA last night and BP was elevated, felt better with Percocet and Labetalol. Overall BPs better. Feels well this morning. Lochia minimal.   Objective: Vital signs in last 24 hours: Temp:  [98.7 F (37.1 C)-99.5 F (37.5 C)] 99.1 F (37.3 C) (09/02 1400) Pulse Rate:  [72-84] 84 (09/02 1400) Resp:  [16-18] 18 (09/02 1400) BP: (120-155)/(76-107) 153/104 mmHg (09/02 1400) SpO2:  [96 %-98 %] 98 % (09/02 1400) Weight:  [216 lb 8 oz (98.204 kg)] 216 lb 8 oz (98.204 kg) (09/02 0552)  Physical Exam:  General: alert and cooperative Lochia: appropriate Uterine Fundus: firm. Active BS. Abdo soft, NT, ND Incision: healing well, no significant drainage, no significant erythema DVT Evaluation: No evidence of DVT seen on physical exam, DTR +2   Recent Labs  06/05/13 0630 06/06/13 0535  HGB 11.4* 8.9*  HCT 33.3* 26.3*    Assessment/Plan: Status post Cesarean section. Doing well postoperatively.  Preeclampsia - cont BP mngmt, Labetalol 100mg  evey 8 hrs and hold if <140/90. Reassess in office in 3 days, staples to be removed on Fri- 9/5.   Carlyon Nolasco R 06/07/2013, 3:56 PM

## 2013-06-07 NOTE — Progress Notes (Signed)
Encouraged patient to ambulate. Informed her of the benefits of ambulation.  Patient's BP at 1000 was 155/101. BP has been an ongoing concern. BP med was given at 0557 and patient is due for another dose at 1400. Having NT reassess BP at 1030.  Clinton Sawyer [place], RN, BSN 10:22 AM

## 2013-06-07 NOTE — Lactation Note (Signed)
This note was copied from the chart of Eileen Rios. Lactation Consultation Note     Follow up consult with this mom of a NICU baby, now 47 hours post partum, and 34 2/[redacted] weeks gestation. Mom has been pumping, but still not expressing any colostrum. On exam, mom has wide set breasts, very soft. I hand expressed both breasts, and was oly able to express a tiny drop of colostrum from right breast. Mom was given the paper work to rent a DEP, and is going to call her insurance company to order a DEP. I advised mom tp pump after holding her baby, and to hand express after pumping. Mom knows to call for quesitons/concerns.  Patient Name: Eileen Rios Date: 06/07/2013 Reason for consult: Follow-up assessment;NICU baby;Late preterm infant   Maternal Data    Feeding Feeding Type: Formula Length of feed: 5 min  LATCH Score/Interventions                      Lactation Tools Discussed/Used Breast pump type: Double-Electric Breast Pump WIC Program: No   Consult Status Consult Status: Follow-up Date: 06/08/13 Follow-up type: In-patient    Alfred Levins 06/07/2013, 9:45 AM

## 2013-06-07 NOTE — Progress Notes (Signed)
Clinical Social Work Department PSYCHOSOCIAL ASSESSMENT - MATERNAL/CHILD 06/07/2013  Patient:  Eileen Rios,Eileen Rios  Account Number:  [id number]  Admit Date:  06/03/2013  Childs Name:   Eileen Rios    Clinical Social Worker:  Shannin Naab, LCSW   Date/Time:  06/07/2013 11:45 AM  Date Referred:  06/06/2013   Referral source  NICU     Referred reason  NICU   Other referral source:    I:  FAMILY / HOME ENVIRONMENT Child's legal guardian:  PARENT  Guardian - Name Guardian - Age Guardian - Address  Synai Aaronson [address] [place], [place] [address]  Jose Arnold  [address] [place],  [address]   Other household support members/support persons Other support:   Family and Friends    II  PSYCHOSOCIAL DATA Information Source:  Patient Interview  Financial and Community Resources Employment:   Both parents employed   Financial resources:  Private Insurance If Medicaid - County:    School / Grade:   Maternity Care Coordinator / Child Services Coordination / Early Interventions:  Cultural issues impacting care:   None noted or related at this time    III  STRENGTHS Strengths  Adequate Resources  Home prepared for Child (including basic supplies)  Supportive family/friends  Understanding of illness   Strength comment:    IV  RISK FACTORS AND CURRENT PROBLEMS Current Problem:  None   Risk Factor & Current Problem Patient Issue Family Issue Risk Factor / Current Problem Comment   N N     V  SOCIAL WORK ASSESSMENT Met with mother who was pleasant and receptive to social work intervention.  Parents are married.  They have no other dependents.   Both parents are employed and mother reports plan to return to work.  Both parents seems to be coping well with newborn NICU admission.  Informed that they have spoken with the medical team and was told that the baby needs to gain weight and learn how to eat.  Parents communicate hopes that infant will continue to do well.   Parents plan to use Dr. Amos for well- baby care.  No acute social concerns related at this time.     VI SOCIAL WORK PLAN Social Work Plan  Psychosocial Support/Ongoing Assessment of Needs   Type of pt/family education:   If child protective services report - county:   If child protective services report - date:   Information/referral to community resources comment:   Other social work plan:   Will continue to follow PRN   Cherylee Rawlinson J, LCSW  

## 2013-06-08 ENCOUNTER — Ambulatory Visit: Payer: Self-pay

## 2013-06-08 ENCOUNTER — Encounter (HOSPITAL_COMMUNITY)
Admission: RE | Admit: 2013-06-08 | Discharge: 2013-06-08 | Disposition: A | Discharge status: Home / Self Care | Payer: BC Managed Care – PPO | Source: Ambulatory Visit | Attending: Obstetrics & Gynecology | Admitting: Obstetrics & Gynecology

## 2013-06-08 DIAGNOSIS — O923 Agalactia: Secondary | ICD-10-CM | POA: Insufficient documentation

## 2013-06-08 LAB — ABO/RH: ABO/RH(D): O POS

## 2013-06-08 MED ORDER — LABETALOL HCL 100 MG PO TABS: 100.0000 mg | ORAL_TABLET | Freq: Three times a day (TID) | ORAL | Status: DC

## 2013-06-08 MED ORDER — MAGNESIUM 250 MG PO TABS: 250.0000 mg | ORAL_TABLET | ORAL | Status: DC | PRN

## 2013-06-08 MED ORDER — POLYSACCHARIDE IRON COMPLEX 150 MG PO CAPS: 150.0000 mg | ORAL_CAPSULE | Freq: Every day | ORAL | Status: DC

## 2013-06-08 MED ORDER — OXYCODONE-ACETAMINOPHEN 5-325 MG PO TABS: 1.0000 | ORAL_TABLET | ORAL | Status: DC | PRN

## 2013-06-08 NOTE — Discharge Summary (Signed)
POSTOPERATIVE DISCHARGE SUMMARY:  Patient ID: Eileen Rios MRN: [id number] DOB/AGE: 1985/12/18 27 y.o.  Admit date: 06/03/2013 Admission Diagnoses: [redacted] weeks gestation / breech presentation / severe preeclampsia  Discharge date:  06/08/2013 Discharge Diagnoses: POD 3 s/p CS / resolving preeclampsia with labile BP  Prenatal history: G2P0111   EDC : 07/17/2013, by Other Basis  Prenatal care at [facility]  Primary provider : Mody Prenatal course complicated by sudden onset gestational hypertension with uncontrolled hypertension  Cardiology consult completed antepartum  Prenatal Labs: ABO, Rh: O/Positive/-- (03/06 0000)  Antibody: Negative (03/06 0000) Rubella: Immune (03/06 0000)  RPR: NON REACTIVE (09/01 0535)  HBsAg: Negative (03/06 0000)  HIV: Non-reactive (03/06 0000)  GTT : NL   Medical / Surgical History :  Past medical history:  Past Medical History  Diagnosis Date  . Hypertension   . Headache(784.0)     migraines  . IBS (irritable bowel syndrome)   . Seasonal allergies     Past surgical history:  Past Surgical History  Procedure Laterality Date  . Wisdom tooth extraction    . Cesarean section N/A 06/05/2013    Procedure: Primary cesarean section with delivery of baby boy at 1033.  Apgars 1/8.;  Surgeon: Genia Del, MD;  Location: [facility];  Service: Obstetrics;  Laterality: N/A;    Family History: History reviewed. No pertinent family history.  Social History:  reports that she has never smoked. She has never used smokeless tobacco. She reports that she does not drink alcohol or use illicit drugs.  Allergies: Review of patient's allergies indicates no known allergies.   Current Medications at time of admission:  Prior to Admission medications   Medication Sig Start Date End Date Taking? Authorizing Provider  acetaminophen (TYLENOL) 500 MG tablet Take 1,000 mg by mouth every 6 (six) hours as needed for pain (For headache.).   Yes  Historical Provider, MD  clotrimazole-betamethasone (LOTRISONE) cream Apply 1 application topically 2 (two) times daily.   Yes Historical Provider, MD  cyclobenzaprine (FLEXERIL) 10 MG tablet Take 10 mg by mouth 3 (three) times daily as needed (For headache.).   Yes Historical Provider, MD  loratadine (CLARITIN) 10 MG tablet Take 10 mg by mouth daily as needed for allergies.   Yes Historical Provider, MD  Prenatal Vit-Fe Fumarate-FA (PRENATAL MULTIVITAMIN) TABS tablet Take 1 tablet by mouth daily at 12 noon.   Yes Historical Provider, MD  ranitidine (ZANTAC) 150 MG tablet Take 150 mg by mouth 2 (two) times daily.   Yes Historical Provider, MD    Hospital Course:  Admit for worsening hypertension - developing preeclampsia Serial PIH labs stable - liver enzymes stable with normal level platelets Uncontrolled hypertension  Significant proteinuria with repeat 24 hour urine - greater than 3 gm/ 24hour Breech presentation Magnesium sulfate for 24 hour prior to delivery   Procedures: Cesarean section delivery on 8/31 with delivery of  female newborn by Dr Seymour Bars  Newborn to NICU See operative report for further details APGAR (1 MIN): 1   APGAR (5 MINS): 8    Postoperative / postpartum course:  Uncomplicated with discharge on POD 3   Physical Exam:   VSS: Temp:  [98 F (36.7 C)-99.1 F (37.3 C)] 98.1 F (36.7 C) (09/03 0552) Pulse Rate:  [79-88] 79 (09/03 0552) Resp:  [16-18] 18 (09/03 0552) BP: (141-157)/(87-107) 148/99 mmHg (09/03 0552) SpO2:  [97 %-99 %] 99 % (09/03 0552) Weight:  [95.142 kg (209 lb 12 oz)] 95.142 kg (209 lb  12 oz) (09/03 0552)  LABS:  Recent Labs  06/06/13 0535  WBC 16.4*  HGB 8.9*  PLT 214   General: pleasant / ambulatory / no PIH symptoms Heart: RRR Lungs: clear  Abdomen: soft and non-tender / non-distended / active BS  Extremities: 1+ pedal edema / negative Homans  Dressing: intact honeycomb dressing Incision:  approximated with staples / no  erythema / no ecchymosis / no drainage  Discharge Instructions:  Discharged Condition: stable  Activity: pelvic rest and postoperative restrictions x 2   Diet: low sodium / increased water hydration  Medications:    Medication List    STOP taking these medications       methyldopa 250 MG tablet  Commonly known as:  ALDOMET      TAKE these medications       acetaminophen 500 MG tablet  Commonly known as:  TYLENOL  Take 1,000 mg by mouth every 6 (six) hours as needed for pain (For headache.).     clotrimazole-betamethasone cream  Commonly known as:  LOTRISONE  Apply 1 application topically 2 (two) times daily.     cyclobenzaprine 10 MG tablet  Commonly known as:  FLEXERIL  Take 10 mg by mouth 3 (three) times daily as needed (For headache.).     iron polysaccharides 150 MG capsule  Commonly known as:  NIFEREX  Take 1 capsule (150 mg total) by mouth daily.     labetalol 100 MG tablet  Commonly known as:  NORMODYNE  Take 1 tablet (100 mg total) by mouth 3 (three) times daily.     loratadine 10 MG tablet  Commonly known as:  CLARITIN  Take 10 mg by mouth daily as needed for allergies.     Magnesium 250 MG Tabs  Take 1 tablet (250 mg total) by mouth as needed (For cramps / prevent constipation with iron).     oxyCODONE-acetaminophen 5-325 MG per tablet  Commonly known as:  PERCOCET/ROXICET  Take 1-2 tablets by mouth every 4 (four) hours as needed.     prenatal multivitamin Tabs tablet  Take 1 tablet by mouth daily at 12 noon.     ranitidine 150 MG tablet  Commonly known as:  ZANTAC  Take 150 mg by mouth 2 (two) times daily.        Wound Care: keep clean and dry / remove honeycomb POD 5 at [facility] with staple removal Postpartum Instructions: [facility] discharge booklet - instructions reviewed  Discharge to: Home  Follow up :  [facility] in 2 days for interval visit  for blood pressure check and staple removal [facility] in 6 weeks for routine postpartum visit with  Dr Juliene Pina                Signed: Marlinda Mike CNM, MSN, [facility] 06/08/2013, 9:23 AM

## 2013-06-08 NOTE — Plan of Care (Signed)
Problem: Discharge Progression Outcomes Goal: Remove staples per MD order Will d/c in MD office Friday

## 2013-06-08 NOTE — Progress Notes (Signed)
POSTOPERATIVE DAY # 3 S/P CS - breech / PEC  S:         Reports feeling well - slept last night / PIH symptoms             Tolerating po intake / no nausea / no vomiting / + flatus / no BM             Bleeding is spotting             Pain controlled with motrin and percocet             Up ad lib / ambulatory/ voiding QS  Newborn in NICU - plans to breast feed / pumping every 2-3 hours  / Circumcision planned at NICU discharge   O:  VS: BP 148/99  Pulse 79  Temp(Src) 98.1 F (36.7 C) (Oral)  Resp 18  Ht 5\' 4"  (1.626 m)  Wt 95.142 kg (209 lb 12 oz)  BMI 35.99 kg/m2  SpO2 99%  BP today 142/87 and 148/99  BP range: 718 202 9659/94-107 yesterday   LABS:               Recent Labs  06/06/13 0535  WBC 16.4*  HGB 8.9*  PLT 214               Bloodtype: O/Positive/-- (03/06 0000)  Rubella: Immune (03/06 0000)                     I & O not measured in past 24 hours                            Physical Exam:             Alert and Oriented X3  Lungs: Clear and unlabored  Heart: regular rate and rhythm / no mumurs  Abdomen: soft, non-tender, non-distended, active BS             Fundus: firm, non-tender, U-2             Dressing intact honeycomb              Incision:  approximated with staples / no erythema / no ecchymosis / no drainage  Perineum: no edema  Lochia: light  Extremities: 1+ pedal edema, no calf pain or tenderness, negative Homans  A:        POD # 3 S/P CS - breech in labor            PEC - labile BPs            ABL anemia  P:        Routine postoperative care              DC home - [facility] booklet / instructions reviewed             Iron for 6 weeks             OV Friday - repeat BP / labs / staple removal    Marlinda Mike CNM, MSN, FACNM 06/08/2013, 9:08 AM

## 2013-06-08 NOTE — Lactation Note (Addendum)
This note was copied from the chart of Eileen Cieanna Stormes. Lactation Consultation Note    Follow up consult with this mom of a NICU baby, now 74 hours post partum. She is still only  expressing  Tiny drops of colostrum. Mom rented a DEP. I instructed her in it's use, and encouraged mom to keep pumping every 3 hour. If her milk comes in, I advised her to pump up to 30 minutes or until her milk stops dripping. I will follow this family in the nICU  Patient Name: Eileen Rios [id number] Date: 06/08/2013     Maternal Data    Feeding Feeding Type: Formula Length of feed: 30 min  LATCH Score/Interventions                      Lactation Tools Discussed/Used     Consult Status      Eileen Rios 06/08/2013, 4:54 PM

## 2013-06-08 NOTE — Progress Notes (Signed)
CSW met with MOB in her third floor room to introduce myself and offer support as she initially met with weekend CSW.  MOB states she and baby are doing well and states no questions, concerns or needs at this time.  She seemed appreciative of CSW's visit.

## 2013-06-23 NOTE — Discharge Summary (Signed)
Reviewed and agree with note and plan. V.Kaylie Ritter, MD  

## 2013-09-13 ENCOUNTER — Other Ambulatory Visit: Payer: Self-pay | Admitting: Physician Assistant

## 2013-09-13 MED ORDER — HYDROCORTISONE 2.5 % RE CREA: 1.0000 "application " | TOPICAL_CREAM | Freq: Two times a day (BID) | RECTAL | Status: DC

## 2013-09-20 ENCOUNTER — Encounter: Payer: Self-pay | Admitting: Internal Medicine

## 2013-09-20 DIAGNOSIS — E8881 Metabolic syndrome: Secondary | ICD-10-CM | POA: Insufficient documentation

## 2013-09-21 ENCOUNTER — Ambulatory Visit: Payer: BC Managed Care – PPO | Admitting: Physician Assistant

## 2013-10-19 ENCOUNTER — Encounter: Payer: Self-pay | Admitting: Emergency Medicine

## 2013-10-19 ENCOUNTER — Ambulatory Visit (INDEPENDENT_AMBULATORY_CARE_PROVIDER_SITE_OTHER): Payer: Self-pay | Admitting: Emergency Medicine

## 2013-10-19 VITALS — BP 126/88 | HR 80 | Temp 98.2°F | Resp 18 | Ht 63.25 in | Wt 196.0 lb

## 2013-10-19 DIAGNOSIS — E559 Vitamin D deficiency, unspecified: Secondary | ICD-10-CM

## 2013-10-19 DIAGNOSIS — I10 Essential (primary) hypertension: Secondary | ICD-10-CM

## 2013-10-19 DIAGNOSIS — M25532 Pain in left wrist: Secondary | ICD-10-CM

## 2013-10-19 DIAGNOSIS — Z79899 Other long term (current) drug therapy: Secondary | ICD-10-CM

## 2013-10-19 DIAGNOSIS — G43909 Migraine, unspecified, not intractable, without status migrainosus: Secondary | ICD-10-CM

## 2013-10-19 DIAGNOSIS — E538 Deficiency of other specified B group vitamins: Secondary | ICD-10-CM

## 2013-10-19 DIAGNOSIS — R5381 Other malaise: Secondary | ICD-10-CM

## 2013-10-19 DIAGNOSIS — R5383 Other fatigue: Principal | ICD-10-CM

## 2013-10-19 DIAGNOSIS — N912 Amenorrhea, unspecified: Secondary | ICD-10-CM

## 2013-10-19 LAB — CBC WITH DIFFERENTIAL/PLATELET
BASOS ABS: 0 10*3/uL (ref 0.0–0.1)
BASOS PCT: 0 % (ref 0–1)
Eosinophils Absolute: 0.1 10*3/uL (ref 0.0–0.7)
Eosinophils Relative: 2 % (ref 0–5)
HEMATOCRIT: 36.6 % (ref 36.0–46.0)
Hemoglobin: 12.3 g/dL (ref 12.0–15.0)
LYMPHS PCT: 27 % (ref 12–46)
Lymphs Abs: 1.9 10*3/uL (ref 0.7–4.0)
MCH: 27.1 pg (ref 26.0–34.0)
MCHC: 33.6 g/dL (ref 30.0–36.0)
MCV: 80.6 fL (ref 78.0–100.0)
MONO ABS: 0.8 10*3/uL (ref 0.1–1.0)
Monocytes Relative: 12 % (ref 3–12)
NEUTROS ABS: 4.1 10*3/uL (ref 1.7–7.7)
NEUTROS PCT: 59 % (ref 43–77)
PLATELETS: 300 10*3/uL (ref 150–400)
RBC: 4.54 MIL/uL (ref 3.87–5.11)
RDW: 15.4 % (ref 11.5–15.5)
WBC: 6.9 10*3/uL (ref 4.0–10.5)

## 2013-10-19 MED ORDER — BUTALBITAL-APAP-CAFFEINE 50-325-40 MG PO TABS: 1.0000 | ORAL_TABLET | Freq: Four times a day (QID) | ORAL | Status: DC | PRN

## 2013-10-19 NOTE — Patient Instructions (Signed)
Migraine Headache A migraine headache is very bad, throbbing pain on one or both sides of your head. Talk to your doctor about what things may bring on (trigger) your migraine headaches. HOME CARE  Only take medicines as told by your doctor.  Lie down in a dark, quiet room when you have a migraine.  Keep a journal to find out if certain things bring on migraine headaches. For example, write down:  What you eat and drink.  How much sleep you get.  Any change to your diet or medicines.  Lessen how much alcohol you drink.  Quit smoking if you smoke.  Get enough sleep.  Lessen any stress in your life.  Keep lights dim if bright lights bother you or make your migraines worse. GET HELP RIGHT AWAY IF:   Your migraine becomes really bad.  You have a fever.  You have a stiff neck.  You have trouble seeing.  Your muscles are weak, or you lose muscle control.  You lose your balance or have trouble walking.  You feel like you will pass out (faint), or you pass out.  You have really bad symptoms that are different than your first symptoms. MAKE SURE YOU:   Understand these instructions.  Will watch your condition.  Will get help right away if you are not doing well or get worse. Document Released: 07/01/2008 Document Revised: 12/15/2011 Document Reviewed: 05/30/2013 [facility] Patient Information 2014 [place], [place]. Fatigue Fatigue is a feeling of tiredness, lack of energy, lack of motivation, or feeling tired all the time. Having enough rest, good nutrition, and reducing stress will normally reduce fatigue. Consult your caregiver if it persists. The nature of your fatigue will help your caregiver to find out its cause. The treatment is based on the cause.  CAUSES  There are many causes for fatigue. Most of the time, fatigue can be traced to one or more of your habits or routines. Most causes fit into one or more of three general areas. They are: Lifestyle problems  Sleep  disturbances.  Overwork.  Physical exertion.  Unhealthy habits.  Poor eating habits or eating disorders.  Alcohol and/or drug use .  Lack of proper nutrition (malnutrition). Psychological problems  Stress and/or anxiety problems.  Depression.  Grief.  Boredom. Medical Problems or Conditions  Anemia.  Pregnancy.  Thyroid gland problems.  Recovery from major surgery.  Continuous pain.  Emphysema or asthma that is not well controlled  Allergic conditions.  Diabetes.  Infections (such as mononucleosis).  Obesity.  Sleep disorders, such as sleep apnea.  Heart failure or other heart-related problems.  Cancer.  Kidney disease.  Liver disease.  Effects of certain medicines such as antihistamines, cough and cold remedies, prescription pain medicines, heart and blood pressure medicines, drugs used for treatment of cancer, and some antidepressants. SYMPTOMS  The symptoms of fatigue include:   Lack of energy.  Lack of drive (motivation).  Drowsiness.  Feeling of indifference to the surroundings. DIAGNOSIS  The details of how you feel help guide your caregiver in finding out what is causing the fatigue. You will be asked about your present and past health condition. It is important to review all medicines that you take, including prescription and non-prescription items. A thorough exam will be done. You will be questioned about your feelings, habits, and normal lifestyle. Your caregiver may suggest blood tests, urine tests, or other tests to look for common medical causes of fatigue.  TREATMENT  Fatigue is treated by correcting the  underlying cause. For example, if you have continuous pain or depression, treating these causes will improve how you feel. Similarly, adjusting the dose of certain medicines will help in reducing fatigue.  HOME CARE INSTRUCTIONS   Try to get the required amount of good sleep every night.  Eat a healthy and nutritious diet, and  drink enough water throughout the day.  Practice ways of relaxing (including yoga or meditation).  Exercise regularly.  Make plans to change situations that cause stress. Act on those plans so that stresses decrease over time. Keep your work and personal routine reasonable.  Avoid street drugs and minimize use of alcohol.  Start taking a daily multivitamin after consulting your caregiver. SEEK MEDICAL CARE IF:   You have persistent tiredness, which cannot be accounted for.  You have fever.  You have unintentional weight loss.  You have headaches.  You have disturbed sleep throughout the night.  You are feeling sad.  You have constipation.  You have dry skin.  You have gained weight.  You are taking any new or different medicines that you suspect are causing fatigue.  You are unable to sleep at night.  You develop any unusual swelling of your legs or other parts of your body. SEEK IMMEDIATE MEDICAL CARE IF:   You are feeling confused.  Your vision is blurred.  You feel faint or pass out.  You develop severe headache.  You develop severe abdominal, pelvic, or back pain.  You develop chest pain, shortness of breath, or an irregular or fast heartbeat.  You are unable to pass a normal amount of urine.  You develop abnormal bleeding such as bleeding from the rectum or you vomit blood.  You have thoughts about harming yourself or committing suicide.  You are worried that you might harm someone else. MAKE SURE YOU:   Understand these instructions.  Will watch your condition.  Will get help right away if you are not doing well or get worse. Document Released: 07/20/2007 Document Revised: 12/15/2011 Document Reviewed: 07/20/2007 [facility] Patient Information 2014 [place], [place].

## 2013-10-20 ENCOUNTER — Telehealth: Payer: Self-pay | Admitting: *Deleted

## 2013-10-20 LAB — TSH: TSH: 1.411 u[IU]/mL (ref 0.350–4.500)

## 2013-10-20 LAB — BASIC METABOLIC PANEL WITH GFR
BUN: 15 mg/dL (ref 6–23)
CHLORIDE: 103 meq/L (ref 96–112)
CO2: 26 mEq/L (ref 19–32)
CREATININE: 0.75 mg/dL (ref 0.50–1.10)
Calcium: 9.7 mg/dL (ref 8.4–10.5)
Glucose, Bld: 80 mg/dL (ref 70–99)
Potassium: 4.1 mEq/L (ref 3.5–5.3)
Sodium: 139 mEq/L (ref 135–145)

## 2013-10-20 LAB — HEPATIC FUNCTION PANEL
ALBUMIN: 4.2 g/dL (ref 3.5–5.2)
ALK PHOS: 91 U/L (ref 39–117)
ALT: 34 U/L (ref 0–35)
AST: 29 U/L (ref 0–37)
BILIRUBIN TOTAL: 0.6 mg/dL (ref 0.3–1.2)
Bilirubin, Direct: 0.1 mg/dL (ref 0.0–0.3)
Indirect Bilirubin: 0.5 mg/dL (ref 0.0–0.9)
TOTAL PROTEIN: 7.1 g/dL (ref 6.0–8.3)

## 2013-10-20 LAB — IRON AND TIBC
%SAT: 8 % — ABNORMAL LOW (ref 20–55)
Iron: 27 ug/dL — ABNORMAL LOW (ref 42–145)
TIBC: 352 ug/dL (ref 250–470)
UIBC: 325 ug/dL (ref 125–400)

## 2013-10-20 LAB — VITAMIN B12: Vitamin B-12: 626 pg/mL (ref 211–911)

## 2013-10-20 LAB — VITAMIN D 25 HYDROXY (VIT D DEFICIENCY, FRACTURES): VIT D 25 HYDROXY: 38 ng/mL (ref 30–89)

## 2013-10-20 LAB — HCG, SERUM, QUALITATIVE: PREG SERUM: NEGATIVE

## 2013-10-20 LAB — MAGNESIUM: MAGNESIUM: 1.9 mg/dL (ref 1.5–2.5)

## 2013-10-20 NOTE — Telephone Encounter (Signed)
Spoke with patient about recent lab results and instructions. 

## 2013-10-20 NOTE — Telephone Encounter (Signed)
Message copied by Laurence SpatesHELMER, Devory Mckinzie A on Thu Oct 20, 2013  8:44 AM ------      Message from: BrandenburgSMITH, [place]ELISSA R      Created: Thu Oct 20, 2013  5:22 AM       Neg pregnant. Add 250 mg magnesium and slow iron. Increase green leafy veggies. Add 5000 vit D. These may all help with fatigue, rest of labs WNL ------

## 2013-10-23 NOTE — Progress Notes (Signed)
   Subjective:    Patient ID: Eileen Rios, female    DOB: [birth year], 28 y.o.   MRN: [id number]  HPI Comments: 28 yo female presents with fatigue and migraine headaches. She notes headaches are usually associated with cycles but cycles are irregular. She notes + aura of confetti/ alcohol smell. She has taking Norco in past and sleep for relief. She denies any triggers except cycles.   She feel on her left out stretched hand earlier today. She notes it has increased with pain and swelling since falling. She has not tried any OTC relief.  Current Outpatient Prescriptions on File Prior to Visit  Medication Sig Dispense Refill  . hydrocortisone (ANUSOL-HC) 2.5 % rectal cream Place 1 application rectally 2 (two) times daily.  30 g  2  . ranitidine (ZANTAC) 150 MG tablet Take 150 mg by mouth 2 (two) times daily.       No current facility-administered medications on file prior to visit.   Review of patient's allergies indicates no known allergies.  Past Medical History  Diagnosis Date  . Hypertension   . Headache(784.0)     migraines  . IBS (irritable bowel syndrome)   . Seasonal allergies   . Insulin resistance       Review of Systems  Constitutional: Positive for fatigue.  Genitourinary: Positive for menstrual problem.  Musculoskeletal: Positive for joint swelling.  Neurological: Positive for headaches.  All other systems reviewed and are negative.  BP 126/88  Pulse 80  Temp(Src) 98.2 F (36.8 C) (Temporal)  Resp 18  Ht 5' 3.25" (1.607 m)  Wt 196 lb (88.905 kg)  BMI 34.43 kg/m2  LMP 10/05/2013      Objective:   Physical Exam  Nursing note and vitals reviewed. Constitutional: She is oriented to person, place, and time. She appears well-developed and well-nourished. No distress.  HENT:  Head: Normocephalic and atraumatic.  Right Ear: External ear normal.  Left Ear: External ear normal.  Nose: Nose normal.  Mouth/Throat: Oropharynx is clear and moist.  Eyes:  Conjunctivae and EOM are normal. Pupils are equal, round, and reactive to light.  Neck: Normal range of motion. Neck supple. No JVD present. No thyromegaly present.  Cardiovascular: Normal rate, regular rhythm, normal heart sounds and intact distal pulses.   Pulmonary/Chest: Effort normal and breath sounds normal.  Abdominal: Soft. Bowel sounds are normal.  Musculoskeletal: Normal range of motion. She exhibits edema and tenderness.  Left wrist with decreased ROM  Lymphadenopathy:    She has no cervical adenopathy.  Neurological: She is alert and oriented to person, place, and time. No cranial nerve deficit.  Skin: Skin is warm and dry. No rash noted. No erythema. No pallor.  Psychiatric: She has a normal mood and affect. Her behavior is normal. Judgment and thought content normal.          Assessment & Plan:  1. Amenorrhea/ Menstrual Migraines/ Fatigue- check labs, increase activity and H2O Trial of Safryal continuous OCP to see if any change with Migraines. SX X 6 packs. Fioricet AD/ PRN. Migraine Prophylaxis explained/ keep diary. Add ASA 81mg  2. Left wrist pain- REF Ortho for eval

## 2013-10-28 ENCOUNTER — Ambulatory Visit (INDEPENDENT_AMBULATORY_CARE_PROVIDER_SITE_OTHER): Payer: Self-pay | Admitting: Physician Assistant

## 2013-10-28 VITALS — BP 126/86 | HR 140 | Temp 101.5°F | Resp 16 | Wt 195.8 lb

## 2013-10-28 DIAGNOSIS — J069 Acute upper respiratory infection, unspecified: Secondary | ICD-10-CM

## 2013-10-28 LAB — CBC WITH DIFFERENTIAL/PLATELET
Basophils Absolute: 0 10*3/uL (ref 0.0–0.1)
Basophils Relative: 0 % (ref 0–1)
EOS ABS: 0 10*3/uL (ref 0.0–0.7)
Eosinophils Relative: 0 % (ref 0–5)
HCT: 41.1 % (ref 36.0–46.0)
HEMOGLOBIN: 13.7 g/dL (ref 12.0–15.0)
LYMPHS ABS: 1.4 10*3/uL (ref 0.7–4.0)
Lymphocytes Relative: 9 % — ABNORMAL LOW (ref 12–46)
MCH: 27.5 pg (ref 26.0–34.0)
MCHC: 33.3 g/dL (ref 30.0–36.0)
MCV: 82.4 fL (ref 78.0–100.0)
MONOS PCT: 6 % (ref 3–12)
Monocytes Absolute: 1 10*3/uL (ref 0.1–1.0)
NEUTROS ABS: 13.3 10*3/uL — AB (ref 1.7–7.7)
NEUTROS PCT: 85 % — AB (ref 43–77)
PLATELETS: 326 10*3/uL (ref 150–400)
RBC: 4.99 MIL/uL (ref 3.87–5.11)
RDW: 16 % — ABNORMAL HIGH (ref 11.5–15.5)
WBC: 15.8 10*3/uL — AB (ref 4.0–10.5)

## 2013-10-28 LAB — BASIC METABOLIC PANEL WITH GFR
BUN: 9 mg/dL (ref 6–23)
CALCIUM: 9.8 mg/dL (ref 8.4–10.5)
CO2: 25 meq/L (ref 19–32)
Chloride: 103 mEq/L (ref 96–112)
Creat: 0.81 mg/dL (ref 0.50–1.10)
GFR, Est Non African American: >89 mL/min
Glucose, Bld: 101 mg/dL — ABNORMAL HIGH (ref 70–99)
POTASSIUM: 4.2 meq/L (ref 3.5–5.3)
SODIUM: 135 meq/L (ref 135–145)

## 2013-10-28 LAB — HEPATIC FUNCTION PANEL
ALT: 32 U/L (ref 0–35)
AST: 25 U/L (ref 0–37)
Albumin: 4.7 g/dL (ref 3.5–5.2)
Alkaline Phosphatase: 94 U/L (ref 39–117)
BILIRUBIN DIRECT: 0.2 mg/dL (ref 0.0–0.3)
BILIRUBIN INDIRECT: 0.9 mg/dL (ref 0.0–0.9)
BILIRUBIN TOTAL: 1.1 mg/dL (ref 0.3–1.2)
Total Protein: 7.9 g/dL (ref 6.0–8.3)

## 2013-10-28 LAB — POC INFLUENZA A&B (BINAX/QUICKVUE)
INFLUENZA A, POC: NEGATIVE
INFLUENZA B, POC: NEGATIVE

## 2013-10-28 MED ORDER — PREDNISONE 20 MG PO TABS: ORAL_TABLET | ORAL | Status: DC

## 2013-10-28 MED ORDER — PROMETHAZINE-CODEINE 6.25-10 MG/5ML PO SYRP: 5.0000 mL | ORAL_SOLUTION | Freq: Four times a day (QID) | ORAL | Status: DC | PRN

## 2013-10-28 MED ORDER — AZITHROMYCIN 250 MG PO TABS: ORAL_TABLET | ORAL | Status: AC

## 2013-10-28 MED ORDER — PROMETHAZINE HCL 25 MG PO TABS: 25.0000 mg | ORAL_TABLET | Freq: Four times a day (QID) | ORAL | Status: DC | PRN

## 2013-10-28 NOTE — Progress Notes (Signed)
   Subjective:    Patient ID: Eileen CapeSamantha M Lawlor, female    DOB: 06/29/1986, 28 y.o.   MRN: [id number]  HPI Woke up this morning with fever, body aches, sore throat, chest tightness, nausea, post nasal drip, cough with yellow mucus production. 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 Not breast feeding.    Review of Systems  Constitutional: Positive for fever, chills and fatigue. Negative for diaphoresis and appetite change.  HENT: Positive for congestion, postnasal drip, rhinorrhea, sinus pressure and sore throat.   Eyes: Negative.   Respiratory: Positive for cough, chest tightness and shortness of breath.   Cardiovascular: Negative.   Gastrointestinal: Positive for nausea. Negative for vomiting, abdominal pain, diarrhea and constipation.  Genitourinary: Negative.   Musculoskeletal: Positive for myalgias.  Neurological: Negative.        Objective:   Physical Exam  Constitutional: She is oriented to person, place, and time. She appears well-developed and well-nourished.  HENT:  Head: Normocephalic and atraumatic.  Eyes: Conjunctivae are normal. Pupils are equal, round, and reactive to light.  Neck: Normal range of motion. Neck supple.  Cardiovascular: Regular rhythm and normal heart sounds.  Tachycardia present.  Exam reveals no decreased pulses.   Pulmonary/Chest: Effort normal and breath sounds normal.  Abdominal: Soft. Bowel sounds are normal.  Musculoskeletal: Normal range of motion.  Neurological: She is alert and oriented to person, place, and time.  Skin: Skin is warm and dry.      Assessment & Plan:  URI (upper respiratory infection) - Plan: POC Influenza A&B, CBC with Differential, BASIC METABOLIC PANEL WITH GFR, Hepatic function panel  Negative flu

## 2013-10-28 NOTE — Patient Instructions (Signed)
The majority of colds are caused by viruses and do not require antibiotics. Please read the rest of this hand out to learn more about the common cold and what you can do to help yourself as well as help prevent the over use of antibiotics.   COMMON COLD SIGNS AND SYMPTOMS - The common cold usually causes nasal congestion, runny nose, and sneezing. A sore throat may be present on the first day but usually resolves quickly. If a cough occurs, it generally develops on about the fourth or fifth day of symptoms, typically when congestion and runny nose are resolving  COMMON COLD COMPLICATIONS - In most cases, colds do not cause serious illness or complications. Most colds last for three to seven days, although many people continue to have symptoms (coughing, sneezing, congestion) for up to two weeks.  One of the more common complications is sinusitis, which is usually caused by viruses and rarely (about 2 percent of the time) by bacteria. Having thick or yellow to green-colored nasal discharge does not mean that bacterial sinusitis has developed; discolored nasal discharge is a normal phase of the common cold.  Lower respiratory infections, such as pneumonia or bronchitis, may develop following a cold.  Infection of the middle ear, or otitis media, can accompany or follow a cold.  COMMON COLD TREATMENT - There is no specific treatment for the viruses that cause the common cold. Most treatments are aimed at relieving some of the symptoms of the cold, but do not shorten or cure the cold. Antibiotics are not useful for treating the common cold; antibiotics are only used to treat illnesses caused by bacteria, not viruses. Unnecessary use of antibiotics for the treatment of the common cold can cause allergic reactions, diarrhea, or other gastrointestinal symptoms in some patients.  The symptoms of a cold will resolve over time, even without any treatment. People with underlying medical conditions and those who use  other over-the-counter or prescription medications should speak with their healthcare provider or pharmacist to ensure that it is safe to use these treatments. The following are treatments that may reduce the symptoms caused by the common cold.  Nasal congestion - Decongestants are good for nasal congestion- if you feel very stuffy but no mucus is coming out, this is the medication that will help you the most.  Pseudoephedrine is a decongestant that can improve nasal congestion. Although a prescription is not required, drugstores in the [place] keep pseudoephedrine behind the counter, so it must be requested from a pharmacist. If you have a heart condition or high blood pressure please use Coricidin BPH instead.   Runny nose - Antihistamines such as diphenhydramine (Benadryl), certazine (Zyrtec) which are best taking at night because they can make you tired OR loratadine (Claritin),  fexafinadine (Allegra) help with a runny nose.   Nasal sprays such an oxymetazoline (Afrin and others) may also give temporary relief of nasal congestion. However, these sprays should never be used for more than two to three days; use for more than three days use can worsen congestion.  Nasocort is now over the counter and can help decrease a runny nose. Please stop the medication if you have blurry vision or nose bleeds.   Sore throat and headache - Sore throat and headache are best treated with a mild pain reliever such as acetaminophen (Tylenol) or a non-steroidal anti-inflammatory agent such as ibuprofen or naproxen (Motrin or Aleve). These medications should be taken with food to prevent stomach problems. As well as   gargling with warm water and salt.   Cough - Common cough medicine ingredients include guaifenesin and dextromethorphan; these are often combined with other medications in over-the-counter cold formulas. Often a cough is worse at night or first in the morning due to post nasal drip from you nose. You can  try to sleep at an angle to decrease a cough.   Alternative treatments - Heated, humidified air can improve symptoms of nasal congestion and runny nose, and causes few to no side effects. A number of alternative products, including vitamin C, doubling up on your vitamin D and herbal products such as echinacea, may help. Certain products, such as nasal gels that contain zinc (eg, Zicam), have been associated with a permanent loss of smell.  Antibiotics - Antibiotics should not be used to treat an uncomplicated common cold. As noted above, colds are caused by viruses. Antibiotics treat bacterial, not viral infections. Some viruses that cause the common cold can also depress the immune system or cause swelling in the lining of the nose or airways; this can, in turn, lead to a bacterial infection. Often you need to give your body 7 days to fight off a common cold while treating the symptoms with the medications listed above. If after 7 days your symptoms are not improving, you are getting worse, you have shortness of breath, chest pain, a fever of over 103 you should seek medical help immediately.   PREVENTION IS THE BEST MEDICINE - Hand washing is an essential and highly effective way to prevent the spread of infection.  Alcohol-based hand rubs are a good alternative for disinfecting hands if a sink is not available.  Hands should be washed before preparing food and eating and after coughing, blowing the nose, or sneezing. While it is not always possible to limit contact with people who may be infected with a cold, touching the eyes, nose, or mouth after direct contact should be avoided when possible. Sneezing/coughing into the sleeve of one's clothing (at the inner elbow) is another means of containing sprays of saliva and secretions and does not contaminate the hands.    INCREASE FLUIDS! Do half gatorade/half water

## 2013-11-07 ENCOUNTER — Encounter: Payer: Self-pay | Admitting: Physician Assistant

## 2013-11-07 ENCOUNTER — Ambulatory Visit (INDEPENDENT_AMBULATORY_CARE_PROVIDER_SITE_OTHER): Payer: BC Managed Care – PPO | Admitting: Physician Assistant

## 2013-11-07 VITALS — BP 102/62 | HR 76 | Temp 98.1°F | Resp 16 | Wt 196.0 lb

## 2013-11-07 DIAGNOSIS — I10 Essential (primary) hypertension: Secondary | ICD-10-CM

## 2013-11-07 DIAGNOSIS — R197 Diarrhea, unspecified: Secondary | ICD-10-CM

## 2013-11-07 DIAGNOSIS — R109 Unspecified abdominal pain: Secondary | ICD-10-CM

## 2013-11-07 MED ORDER — HYOSCYAMINE SULFATE 0.125 MG SL SUBL: 0.1250 mg | SUBLINGUAL_TABLET | SUBLINGUAL | Status: DC | PRN

## 2013-11-07 NOTE — Progress Notes (Signed)
   Subjective:    Patient ID: Eileen Rios, female    DOB: 09/13/1986, 28 y.o.   MRN: [id number]  HPI 28 y.o. female 5 months post partum, recently treated with ABX on 01/23 and had diarrhea at that time. Took 2 imodium and states that she was feeling better. Then yesterday morning before eating she had lower abdominal cramping, had 6-7 watery diarrhea with cramping, no blood, took 6 pepto before diarrhea stopped. This AM she has been having epigastric pain and worse with food., some nausea but denies vomittng, fever, chills, black stools.   Has a history of IBS. She is on Iron, mag, vitamin d and on new BCP. Takes zantac PRN.   Review of Systems  Constitutional: Negative.   HENT: Negative.   Respiratory: Negative.   Cardiovascular: Negative.   Gastrointestinal: Positive for nausea, abdominal pain, diarrhea and abdominal distention. Negative for vomiting, constipation, blood in stool, anal bleeding and rectal pain.  Endocrine: Negative.   Genitourinary: Negative.   Musculoskeletal: Negative.   Neurological: Negative.   Hematological: Negative.   Psychiatric/Behavioral: Negative.       Objective:   Physical Exam  Vitals reviewed. Constitutional: She is oriented to person, place, and time. She appears well-developed and well-nourished.  Neck: Normal range of motion. Neck supple.  Cardiovascular: Normal rate and regular rhythm.   Pulmonary/Chest: Effort normal and breath sounds normal.  Abdominal: Soft. Bowel sounds are normal. She exhibits no mass. There is tenderness in the epigastric area and left lower quadrant. There is no rigidity, no rebound, no guarding, no tenderness at McBurney's point and negative Murphy's sign.  Neurological: She is alert and oriented to person, place, and time.  Skin: Skin is warm and dry. No rash noted.       Assessment & Plan:  Gastroenteritis?   Recheck CBC, BMP, Liver  Increase fluids  Stop magnesium  Nexium samples  Levsin called  in   [place] diet

## 2013-11-07 NOTE — Patient Instructions (Addendum)
Increase fluids Stop magnesium Nexium samples Levsin take as needed for nausea/diarrhea [place] diet probiotic  Viral Gastroenteritis Viral gastroenteritis is also called stomach flu. This illness is caused by a certain type of germ (virus). It can cause sudden watery poop (diarrhea) and throwing up (vomiting). This can cause you to lose body fluids (dehydration). This illness usually lasts for 3 to 8 days. It usually goes away on its own. HOME CARE   Drink enough fluids to keep your pee (urine) clear or pale yellow. Drink small amounts of fluids often.  Ask your doctor how to replace body fluid losses (rehydration).  Avoid:  Foods high in sugar.  Alcohol.  Bubbly (carbonated) drinks.  Tobacco.  Juice.  Caffeine drinks.  Very hot or cold fluids.  Fatty, greasy foods.  Eating too much at one time.  Dairy products until 24 to 48 hours after your watery poop stops.  You may eat foods with active cultures (probiotics). They can be found in some yogurts and supplements.  Wash your hands well to avoid spreading the illness.  Only take medicines as told by your doctor. Do not give aspirin to children. Do not take medicines for watery poop (antidiarrheals).  Ask your doctor if you should keep taking your regular medicines.  Keep all doctor visits as told. GET HELP RIGHT AWAY IF:   You cannot keep fluids down.  You do not pee at least once every 6 to 8 hours.  You are short of breath.  You see blood in your poop or throw up. This may look like coffee grounds.  You have belly (abdominal) pain that gets worse or is just in one small spot (localized).  You keep throwing up or having watery poop.  You have a fever.  The patient is a child younger than 3 months, and he or she has a fever.  The patient is a child older than 3 months, and he or she has a fever and problems that do not go away.  The patient is a child older than 3 months, and he or she has a fever and  problems that suddenly get worse.  The patient is a baby, and he or she has no tears when crying. MAKE SURE YOU:   Understand these instructions.  Will watch your condition.  Will get help right away if you are not doing well or get worse. Document Released: 03/10/2008 Document Revised: 12/15/2011 Document Reviewed: 07/09/2011 [facility] Patient Information 2014 [place], [place].   Diet for Gastroesophageal Reflux Disease, Adult Reflux is when stomach acid flows up into the esophagus. The esophagus becomes irritated and sore (inflammation). When reflux happens often and is severe, it is called gastroesophageal reflux disease (GERD). What you eat can help ease any discomfort caused by GERD. FOODS OR DRINKS TO AVOID OR LIMIT  Coffee and black tea, with or without caffeine.  Bubbly (carbonated) drinks with caffeine or energy drinks.  Strong spices, such as pepper, cayenne pepper, curry, or chili powder.  Peppermint or spearmint.  Chocolate.  High-fat foods, such as meats, fried food, oils, butter, or nuts.  Fruits and vegetables that cause discomfort. This includes citrus fruits and tomatoes.  Alcohol. If a certain food or drink irritates your GERD, avoid eating or drinking it. THINGS THAT MAY HELP GERD INCLUDE:  Eat meals slowly.  Eat 5 to 6 small meals a day, not 3 large meals.  Do not eat food for a certain amount of time if it causes discomfort.  Wait  3 hours after eating before lying down.  Keep the head of your bed raised 6 to 9 inches (15 23 centimeters). Put a foam wedge or blocks under the legs of the bed.  Stay active. Weight loss, if needed, may help ease your discomfort.  Wear loose-fitting clothing.  Do not smoke or chew tobacco. Document Released: 03/23/2012 Document Reviewed: 03/23/2012 [facility] Patient Information 2014 [place], [place].

## 2013-11-08 LAB — CBC WITH DIFFERENTIAL/PLATELET
Basophils Absolute: 0 10*3/uL (ref 0.0–0.1)
Basophils Relative: 0 % (ref 0–1)
EOS ABS: 0.2 10*3/uL (ref 0.0–0.7)
EOS PCT: 3 % (ref 0–5)
HEMATOCRIT: 36.7 % (ref 36.0–46.0)
Hemoglobin: 12 g/dL (ref 12.0–15.0)
LYMPHS ABS: 2.3 10*3/uL (ref 0.7–4.0)
LYMPHS PCT: 30 % (ref 12–46)
MCH: 26.8 pg (ref 26.0–34.0)
MCHC: 32.7 g/dL (ref 30.0–36.0)
MCV: 81.9 fL (ref 78.0–100.0)
MONO ABS: 0.7 10*3/uL (ref 0.1–1.0)
MONOS PCT: 8 % (ref 3–12)
Neutro Abs: 4.6 10*3/uL (ref 1.7–7.7)
Neutrophils Relative %: 59 % (ref 43–77)
Platelets: 310 10*3/uL (ref 150–400)
RBC: 4.48 MIL/uL (ref 3.87–5.11)
RDW: 15.6 % — ABNORMAL HIGH (ref 11.5–15.5)
WBC: 7.9 10*3/uL (ref 4.0–10.5)

## 2013-11-08 LAB — HEPATIC FUNCTION PANEL
ALBUMIN: 4 g/dL (ref 3.5–5.2)
ALK PHOS: 84 U/L (ref 39–117)
ALT: 26 U/L (ref 0–35)
AST: 22 U/L (ref 0–37)
BILIRUBIN TOTAL: 0.5 mg/dL (ref 0.2–1.2)
Bilirubin, Direct: 0.1 mg/dL (ref 0.0–0.3)
Indirect Bilirubin: 0.4 mg/dL (ref 0.2–1.2)
TOTAL PROTEIN: 6.9 g/dL (ref 6.0–8.3)

## 2013-11-08 LAB — BASIC METABOLIC PANEL WITH GFR
BUN: 16 mg/dL (ref 6–23)
CHLORIDE: 104 meq/L (ref 96–112)
CO2: 27 meq/L (ref 19–32)
CREATININE: 0.79 mg/dL (ref 0.50–1.10)
Calcium: 9.7 mg/dL (ref 8.4–10.5)
GFR, Est African American: >89 mL/min
GFR, Est Non African American: >89 mL/min
GLUCOSE: 89 mg/dL (ref 70–99)
Potassium: 4.4 mEq/L (ref 3.5–5.3)
Sodium: 139 mEq/L (ref 135–145)

## 2013-11-09 ENCOUNTER — Emergency Department (HOSPITAL_COMMUNITY)
Admission: EM | Admit: 2013-11-09 | Discharge: 2013-11-09 | Disposition: A | Discharge status: Home / Self Care | Payer: BC Managed Care – PPO | Attending: Emergency Medicine | Admitting: Emergency Medicine

## 2013-11-09 ENCOUNTER — Encounter: Payer: Self-pay | Admitting: Internal Medicine

## 2013-11-09 ENCOUNTER — Encounter (HOSPITAL_COMMUNITY): Payer: Self-pay | Admitting: Emergency Medicine

## 2013-11-09 DIAGNOSIS — Z3202 Encounter for pregnancy test, result negative: Secondary | ICD-10-CM | POA: Insufficient documentation

## 2013-11-09 DIAGNOSIS — Z862 Personal history of diseases of the blood and blood-forming organs and certain disorders involving the immune mechanism: Secondary | ICD-10-CM | POA: Insufficient documentation

## 2013-11-09 DIAGNOSIS — Z792 Long term (current) use of antibiotics: Secondary | ICD-10-CM | POA: Insufficient documentation

## 2013-11-09 DIAGNOSIS — R1032 Left lower quadrant pain: Secondary | ICD-10-CM | POA: Insufficient documentation

## 2013-11-09 DIAGNOSIS — Z8639 Personal history of other endocrine, nutritional and metabolic disease: Secondary | ICD-10-CM | POA: Insufficient documentation

## 2013-11-09 DIAGNOSIS — IMO0002 Reserved for concepts with insufficient information to code with codable children: Secondary | ICD-10-CM | POA: Insufficient documentation

## 2013-11-09 DIAGNOSIS — Z8709 Personal history of other diseases of the respiratory system: Secondary | ICD-10-CM | POA: Insufficient documentation

## 2013-11-09 DIAGNOSIS — K589 Irritable bowel syndrome without diarrhea: Secondary | ICD-10-CM | POA: Insufficient documentation

## 2013-11-09 DIAGNOSIS — I10 Essential (primary) hypertension: Secondary | ICD-10-CM | POA: Insufficient documentation

## 2013-11-09 DIAGNOSIS — R197 Diarrhea, unspecified: Secondary | ICD-10-CM

## 2013-11-09 DIAGNOSIS — Z79899 Other long term (current) drug therapy: Secondary | ICD-10-CM | POA: Insufficient documentation

## 2013-11-09 DIAGNOSIS — R110 Nausea: Secondary | ICD-10-CM | POA: Insufficient documentation

## 2013-11-09 LAB — CBC WITH DIFFERENTIAL/PLATELET
BASOS ABS: 0 10*3/uL (ref 0.0–0.1)
BASOS PCT: 0 % (ref 0–1)
Eosinophils Absolute: 0.1 10*3/uL (ref 0.0–0.7)
Eosinophils Relative: 1 % (ref 0–5)
HEMATOCRIT: 38.3 % (ref 36.0–46.0)
Hemoglobin: 13.2 g/dL (ref 12.0–15.0)
Lymphocytes Relative: 14 % (ref 12–46)
Lymphs Abs: 1.8 10*3/uL (ref 0.7–4.0)
MCH: 28.1 pg (ref 26.0–34.0)
MCHC: 34.5 g/dL (ref 30.0–36.0)
MCV: 81.5 fL (ref 78.0–100.0)
MONO ABS: 0.6 10*3/uL (ref 0.1–1.0)
Monocytes Relative: 5 % (ref 3–12)
NEUTROS ABS: 9.7 10*3/uL — AB (ref 1.7–7.7)
Neutrophils Relative %: 79 % — ABNORMAL HIGH (ref 43–77)
PLATELETS: 265 10*3/uL (ref 150–400)
RBC: 4.7 MIL/uL (ref 3.87–5.11)
RDW: 14.4 % (ref 11.5–15.5)
WBC: 12.3 10*3/uL — ABNORMAL HIGH (ref 4.0–10.5)

## 2013-11-09 LAB — URINALYSIS, ROUTINE W REFLEX MICROSCOPIC
Bilirubin Urine: NEGATIVE
Glucose, UA: NEGATIVE mg/dL
Hgb urine dipstick: NEGATIVE
Ketones, ur: NEGATIVE mg/dL
LEUKOCYTES UA: NEGATIVE
Nitrite: NEGATIVE
PH: 7.5 (ref 5.0–8.0)
Protein, ur: NEGATIVE mg/dL
Specific Gravity, Urine: 1.016 (ref 1.005–1.030)
Urobilinogen, UA: 0.2 mg/dL (ref 0.0–1.0)

## 2013-11-09 LAB — COMPREHENSIVE METABOLIC PANEL
ALBUMIN: 3.8 g/dL (ref 3.5–5.2)
ALT: 29 U/L (ref 0–35)
AST: 24 U/L (ref 0–37)
Alkaline Phosphatase: 96 U/L (ref 39–117)
BILIRUBIN TOTAL: 0.6 mg/dL (ref 0.3–1.2)
BUN: 14 mg/dL (ref 6–23)
CALCIUM: 9.1 mg/dL (ref 8.4–10.5)
CHLORIDE: 104 meq/L (ref 96–112)
CO2: 23 mEq/L (ref 19–32)
CREATININE: 0.86 mg/dL (ref 0.50–1.10)
GFR calc Af Amer: >90 mL/min (ref >90)
GFR calc non Af Amer: >90 mL/min (ref >90)
Glucose, Bld: 92 mg/dL (ref 70–99)
Potassium: 4.1 mEq/L (ref 3.7–5.3)
Sodium: 140 mEq/L (ref 137–147)
Total Protein: 7.8 g/dL (ref 6.0–8.3)

## 2013-11-09 LAB — PREGNANCY, URINE: Preg Test, Ur: NEGATIVE

## 2013-11-09 LAB — LIPASE, BLOOD: Lipase: 29 U/L (ref 11–59)

## 2013-11-09 MED ORDER — ONDANSETRON HCL 4 MG/2ML IJ SOLN
4.0000 mg | Freq: Once | INTRAMUSCULAR | Status: AC
  Administered 2013-11-09: 4 mg via INTRAVENOUS
  Filled 2013-11-09: qty 2

## 2013-11-09 MED ORDER — SODIUM CHLORIDE 0.9 % IV BOLUS (SEPSIS)
1000.0000 mL | Freq: Once | INTRAVENOUS | Status: AC
  Administered 2013-11-09: 1000 mL via INTRAVENOUS

## 2013-11-09 MED ORDER — ONDANSETRON 4 MG PO TBDP: 4.0000 mg | ORAL_TABLET | Freq: Three times a day (TID) | ORAL | Status: DC | PRN

## 2013-11-09 NOTE — ED Provider Notes (Signed)
CSN: [id number]     Arrival date & time 11/09/13  1515 History   First MD Initiated Contact with Patient 11/09/13 1855     Chief Complaint  Patient presents with  . Diarrhea   (Consider location/radiation/quality/duration/timing/severity/associated sxs/prior Treatment) HPI Comments: Eileen Rios is a 28 y.o. year-old female with a past medical history of IBS, Headache, HTN, presenting the Emergency Department with a chief complaint of diarrhea for 1 day.  The patient reports 8-10 episodes of non-bloody diarrhea since 12. She reports bowel movements went from loose stool to mucous. Last mucous stools 1630.  She reports Left lower quadrant abdominal cramping just prior and during BM. The patient complaints of associated nausea without emesis.  She reports food aggravates LLQ discomfort and diarrhea. She reports she was evaluated by her PCP 2 days ago and was started on Nexium, Levsin and probiotic.   Reports taking Levsin and Imodium without relief. Denies fever or chills, urinary symptoms, or vomiting. No history of colonoscopy. PCP: Eileen Corwin, MD    The history is provided by the patient and medical records. No language interpreter was used.    Past Medical History  Diagnosis Date  . Hypertension   . Headache(784.0)     migraines  . IBS (irritable bowel syndrome)   . Seasonal allergies   . Insulin resistance    Past Surgical History  Procedure Laterality Date  . Wisdom tooth extraction    . Cesarean section N/A 06/05/2013    Procedure: Primary cesarean section with delivery of baby boy at 1033.  Apgars 1/8.;  Surgeon: Eileen Del, MD;  Location: [facility];  Service: Obstetrics;  Laterality: N/A;   History reviewed. No pertinent family history. History  Substance Use Topics  . Smoking status: Never Smoker   . Smokeless tobacco: Never Used  . Alcohol Use: No   OB History   Grav Para Term Preterm Abortions TAB SAB Ect Mult Living   2 1  1 1  1   1      Review  of Systems  Constitutional: Negative for fever and chills.  Gastrointestinal: Positive for nausea, abdominal pain and diarrhea. Negative for vomiting, constipation, blood in stool, abdominal distention and anal bleeding.  Genitourinary: Negative for dysuria, frequency and hematuria.  Musculoskeletal: Negative for back pain.  Skin: Negative for pallor and rash.    Allergies  Review of patient's allergies indicates no known allergies.  Home Medications   Current Outpatient Rx  Name  Route  Sig  Dispense  Refill  . butalbital-acetaminophen-caffeine (FIORICET) 50-325-40 MG per tablet   Oral   Take 1-2 tablets by mouth every 6 (six) hours as needed for headache.   45 tablet   0   . Cholecalciferol (VITAMIN D3) 5000 UNITS TABS   Oral   Take 5,000 Units by mouth at bedtime.         . clindamycin (CLINDAGEL) 1 % gel   Topical   Apply 1 application topically 2 (two) times daily.         . Drospirenone-Ethinyl Estradiol-Levomefol (SAFYRAL) 3-0.03-0.451 MG tablet   Oral   Take 1 tablet by mouth daily.         Marland Kitchen esomeprazole (NEXIUM) 20 MG capsule   Oral   Take 20 mg by mouth daily at 12 noon.         . Ferrous Sulfate Dried (SLOW IRON PO)   Oral   Take 1 tablet by mouth at bedtime.          Marland Kitchen  hydrocortisone (ANUSOL-HC) 2.5 % rectal cream   Rectal   Place 1 application rectally daily as needed for hemorrhoids or itching.         . hyoscyamine (LEVSIN/SL) 0.125 MG SL tablet   Oral   Take 1 tablet (0.125 mg total) by mouth every 4 (four) hours as needed for cramping (nausea, diarrhea).   50 tablet   1   . ibuprofen (ADVIL,MOTRIN) 200 MG tablet   Oral   Take 400-600 mg by mouth every 6 (six) hours as needed for headache.         . Multiple Vitamin (MULTIVITAMIN WITH MINERALS) TABS tablet   Oral   Take 1 tablet by mouth daily.         . promethazine (PHENERGAN) 25 MG tablet   Oral   Take 1 tablet (25 mg total) by mouth every 6 (six) hours as needed for  nausea or vomiting.   30 tablet   0    BP 188/97  Pulse 100  Temp(Src) 98 F (36.7 C) (Oral)  Resp 18  SpO2 98%  LMP 10/05/2013 Physical Exam  Nursing note and vitals reviewed. Constitutional: She is oriented to person, place, and time. She appears well-developed and well-nourished. No distress.  HENT:  Head: Normocephalic and atraumatic.  Mouth/Throat: Mucous membranes are dry. No oropharyngeal exudate, posterior oropharyngeal edema or posterior oropharyngeal erythema.  Eyes: Conjunctivae are normal. Pupils are equal, round, and reactive to light.  Neck: Neck supple.  Cardiovascular: Normal rate, regular rhythm and normal heart sounds.   Pulmonary/Chest: Effort normal and breath sounds normal.  Abdominal: Soft. Normal appearance and bowel sounds are normal. She exhibits no distension. There is no tenderness. There is no rebound, no guarding, no CVA tenderness, no tenderness at McBurney's point and negative Murphy's sign.  Neurological: She is oriented to person, place, and time.  Skin: Skin is warm and dry.  Psychiatric: She has a normal mood and affect. Her behavior is normal.    ED Course  Procedures (including critical care time) Labs Review Labs Reviewed  CBC WITH DIFFERENTIAL - Abnormal; Notable for the following:    WBC 12.3 (*)    Neutrophils Relative % 79 (*)    Neutro Abs 9.7 (*)    All other components within normal limits  COMPREHENSIVE METABOLIC PANEL  LIPASE, BLOOD  PREGNANCY, URINE  URINALYSIS, ROUTINE W REFLEX MICROSCOPIC   Imaging Review No results found.  EKG Interpretation   None       MDM   1. Diarrhea    Pt with ongoing diarrhea, was evaluated by PCP on 11/07/2013 started on Levsin, nexium reports diarrhea episode after lunch today. Labs unremarkable. Likely IBS. EMR reveals Z-pak 11/02/2013, Dr. Rubin Rios advises evaluate for C-diff. Discussed lab results, and treatment plan with the patient and patient's mother.  Advise the patient to  continue therapy of Levsin and take it 30-60 minutes prior to a meal as advised. Return precautions given. Reports understanding and no other concerns at this time.  Patient is stable for discharge at this time.  Meds given in ED:  Medications  sodium chloride 0.9 % bolus 1,000 mL (0 mLs Intravenous Stopped 11/09/13 2123)  ondansetron (ZOFRAN) injection 4 mg (4 mg Intravenous Given 11/09/13 2009)    Discharge Medication List as of 11/09/2013  9:28 PM    START taking these medications   Details  ondansetron (ZOFRAN ODT) 4 MG disintegrating tablet Take 1 tablet (4 mg total) by mouth every 8 (eight) hours as  needed for nausea or vomiting., Starting 11/09/2013, Until Discontinued, Print            Clabe SealLauren M Sunday Klos, PA-C 11/11/13 1207

## 2013-11-09 NOTE — ED Notes (Signed)
Pt in c/o diarrhea that started on Sunday morning, states before that she had been on a round of antibiotics for a "bacterial infection", diarrhea 10+ times a day and states it looks like mucous. Denies fever with this.

## 2013-11-09 NOTE — Discharge Instructions (Signed)
Call for a follow up appointment with a Family or Primary Care Provider.  Call [facility] for a follow appointment for further evaluation of your diarrhea.  Return if Symptoms worsen.   Take medication as prescribed.  Continue taking your Levsin, 30-60 minutes prior to meals.

## 2013-11-09 NOTE — ED Notes (Signed)
Pt informed of need for stool sample.

## 2013-11-14 NOTE — ED Provider Notes (Signed)
Medical screening examination/treatment/procedure(s) were performed by non-physician practitioner and as supervising physician I was immediately available for consultation/collaboration.  EKG Interpretation   None        Vermelle Cammarata R. Ruweyda Macknight, MD 11/14/13 2044 

## 2013-12-07 ENCOUNTER — Encounter: Payer: Self-pay | Admitting: Physician Assistant

## 2013-12-07 ENCOUNTER — Ambulatory Visit (INDEPENDENT_AMBULATORY_CARE_PROVIDER_SITE_OTHER): Payer: BC Managed Care – PPO | Admitting: Physician Assistant

## 2013-12-07 VITALS — BP 122/68 | HR 68 | Temp 97.9°F | Resp 16 | Wt 186.0 lb

## 2013-12-07 DIAGNOSIS — R5381 Other malaise: Secondary | ICD-10-CM

## 2013-12-07 DIAGNOSIS — E669 Obesity, unspecified: Secondary | ICD-10-CM

## 2013-12-07 DIAGNOSIS — E8881 Metabolic syndrome: Secondary | ICD-10-CM

## 2013-12-07 DIAGNOSIS — A0472 Enterocolitis due to Clostridium difficile, not specified as recurrent: Secondary | ICD-10-CM

## 2013-12-07 DIAGNOSIS — E88819 Insulin resistance, unspecified: Secondary | ICD-10-CM

## 2013-12-07 DIAGNOSIS — R5383 Other fatigue: Principal | ICD-10-CM

## 2013-12-07 MED ORDER — ONDANSETRON HCL 4 MG PO TABS: 4.0000 mg | ORAL_TABLET | Freq: Every day | ORAL | Status: DC | PRN

## 2013-12-07 MED ORDER — ONDANSETRON 4 MG PO TBDP: 4.0000 mg | ORAL_TABLET | Freq: Three times a day (TID) | ORAL | Status: DC | PRN

## 2013-12-07 NOTE — Progress Notes (Signed)
   Subjective:    Patient ID: Eileen Rios, female    DOB: 20-May-1986, 28 y.o.   MRN: [id number]  HPI 28 y.o. female 6 months post partum with first child presents for recurrent illness and decreased interest in wanting to do things since her baby was born. She complains of fatigue, intermittent fevers, AB pain, diarrhea on and off for 6 months. She has a decreased interest in wanting to spend time with her husband from fatigue, no thoughts of harming herself or child. She was just recently diagnosed with Cdiff which could explain many of her symptoms. She has had normal labs other than iron def.   Review of Systems  Constitutional: Positive for fever, chills, activity change and fatigue. Negative for diaphoresis and appetite change.  HENT: Negative.   Eyes: Negative.   Respiratory: Negative.  Negative for cough.   Cardiovascular: Negative.   Gastrointestinal: Positive for nausea, abdominal pain and diarrhea. Negative for vomiting, constipation, blood in stool, abdominal distention, anal bleeding and rectal pain.  Endocrine: Negative.   Genitourinary: Negative.   Musculoskeletal: Negative for arthralgias, back pain, gait problem, joint swelling, myalgias, neck pain and neck stiffness.  Skin: Negative.   Neurological: Negative for dizziness and headaches.       Objective:   Physical Exam  Vitals reviewed. Constitutional: She is oriented to person, place, and time. She appears well-developed and well-nourished.  Neck: Normal range of motion. Neck supple.  Cardiovascular: Normal rate and regular rhythm.   Pulmonary/Chest: Effort normal and breath sounds normal.  Abdominal: Soft. Bowel sounds are normal. She exhibits no mass. There is tenderness. There is guarding. There is no rebound.  Neurological: She is alert and oriented to person, place, and time.  Skin: Skin is warm and dry. No rash noted.       Assessment & Plan:  Fatigue/fever- explained this could be from the Cdiff, she  is currently on treatment, I have also given her fusion samples for her iron diff. Instructed about hand washing, etc.   ? Post partum depression vs infection- at this time she does not want treatment and feels that if she is feeling better she will emotionally be better. Follow up in 3-4 weeks and if continue disinterest/fatigue we will consider SSRI.

## 2013-12-07 NOTE — Patient Instructions (Signed)
Clostridium Difficile Infection Clostridium difficile (C. difficile) is a bacteria found in the intestinal tract or colon. Under certain conditions, it causes diarrhea and sometimes severe disease. The severe form of the disease is known as pseudomembranous colitis (often called C. difficile colitis). This disease can damage the lining of the colon or cause the colon to become enlarged (toxic megacolon).  CAUSES  Your colon normally contains many different bacteria, including C. difficile. The balance of bacteria in your colon can change during illness. This is especially true when you take antibiotic medicine. Taking antibiotics may allow the C. difficile to grow, multiply excessively, and make a toxin that then causes illness. The elderly and people with certain medical conditions have a greater risk of getting C. difficile infections. SYMPTOMS   Watery diarrhea.  Fever.  Fatigue.  Loss of appetite.  Nausea.  Abdominal swelling, pain, or tenderness.  Dehydration. DIAGNOSIS  Your symptoms may make your caregiver suspicious of a C. difficile infection, especially if you have used antibiotics in the preceding weeks. However, there are only 2 ways to know for certain whether you have a C. difficile infection:  A lab test that finds the toxin in your stool.  The specific appearance of an abnormality (pseudomembrane) in your colon. This can only be seen by doing a sigmoidoscopy or colonoscopy. These procedures involve passing an instrument through your rectum to look at the inside of your colon. Your caregiver will help determine if these tests are necessary. TREATMENT   Most people are successfully treated with one of two specific antibiotics, usually given by mouth. Other antibiotics you are receiving are stopped if possible.  Intravenous (IV) fluids and correction of electrolyte imbalance may be necessary.  Rarely, surgery may be needed to remove the infected part of the  intestines.  Careful hand washing by you and your caregivers is important to prevent the spread of infection. In the hospital, your caregivers may also put on gowns and gloves to prevent the spread of the C. difficile bacteria. Your room is also cleaned regularly with a solution containing bleach or a product that is known to kill C. difficile. HOME CARE INSTRUCTIONS  Drink enough fluids to keep your urine clear or pale yellow. Avoid milk, caffeine, and alcohol.  Ask your caregiver for specific rehydration instructions.  Try eating small, frequent meals rather than large meals.  Take your antibiotics as directed. Finish them even if you start to feel better.  Do not use medicines to slow diarrhea. This could delay healing or cause complications.  Wash your hands thoroughly after using the bathroom and before preparing food.  Make sure people who live with you wash their hands often, too.  Carefully disinfect all surfaces with a product that contains chlorine bleach. SEEK MEDICAL CARE IF:  Diarrhea persists longer than expected or recurs after completing your course of antibiotic treatment for the C. difficile infection.  You have trouble staying hydrated. SEEK IMMEDIATE MEDICAL CARE IF:  You develop a new fever.  You have increasing abdominal pain or tenderness.  There is blood in your stools, or your stools are dark black and tarry.  You cannot hold down food or liquids. MAKE SURE YOU:   Understand these instructions.  Will watch your condition.  Will get help right away if you are not doing well or get worse. Document Released: 07/02/2005 Document Revised: 01/17/2013 Document Reviewed: 02/28/2011 [facility] Patient Information 2014 [place], [place]. Postpartum Depression and Baby Blues The postpartum period begins right after the  birth of a baby. During this time, there is often a great amount of joy and excitement. It is also a time of considerable changes in the life of  the parent(s). Regardless of how many times a mother gives birth, each child brings new challenges and dynamics to the family. It is not unusual to have feelings of excitement accompanied by confusing shifts in moods, emotions, and thoughts. All mothers are at risk of developing postpartum depression or the "baby blues." These mood changes can occur right after giving birth, or they may occur many months after giving birth. The baby blues or postpartum depression can be mild or severe. Additionally, postpartum depression can resolve rather quickly, or it can be a long-term condition. CAUSES Elevated hormones and their rapid decline are thought to be a main cause of postpartum depression and the baby blues. There are a number of hormones that radically change during and after pregnancy. Estrogen and progesterone usually decrease immediately after delivering your baby. The level of thyroid hormone and various cortisol steroids also rapidly drop. Other factors that play a major role in these changes include major life events and genetics.  RISK FACTORS If you have any of the following risks for the baby blues or postpartum depression, know what symptoms to watch out for during the postpartum period. Risk factors that may increase the likelihood of getting the baby blues or postpartum depression include:  Havinga personal or family history of depression.  Having depression while being pregnant.  Having premenstrual or oral contraceptive-associated mood issues.  Having exceptional life stress.  Having marital conflict.  Lacking a social support network.  Having a baby with special needs.  Having health problems such as diabetes. SYMPTOMS Baby blues symptoms include:  Brief fluctuations in mood, such as going from extreme happiness to sadness.  Decreased concentration.  Difficulty sleeping.  Crying spells, tearfulness.  Irritability.  Anxiety. Postpartum depression symptoms typically  begin within the first month after giving birth. These symptoms include:  Difficulty sleeping or excessive sleepiness.  Marked weight loss.  Agitation.  Feelings of worthlessness.  Lack of interest in activity or food. Postpartum psychosis is a very concerning condition and can be dangerous. Fortunately, it is rare. Displaying any of the following symptoms is cause for immediate medical attention. Postpartum psychosis symptoms include:  Hallucinations and delusions.  Bizarre or disorganized behavior.  Confusion or disorientation. DIAGNOSIS  A diagnosis is made by an evaluation of your symptoms. There are no medical or lab tests that lead to a diagnosis, but there are various questionnaires that a caregiver may use to identify those with the baby blues, postpartum depression, or psychosis. Often times, a screening tool called the [place] Postnatal Depression Scale is used to diagnose depression in the postpartum period.  TREATMENT The baby blues usually goes away on its own in 1 to 2 weeks. Social support is often all that is needed. You should be encouraged to get adequate sleep and rest. Occasionally, you may be given medicines to help you sleep.  Postpartum depression requires treatment as it can last several months or longer if it is not treated. Treatment may include individual or group therapy, medicine, or both to address any social, physiological, and psychological factors that may play a role in the depression. Regular exercise, a healthy diet, rest, and social support may also be strongly recommended.  Postpartum psychosis is more serious and needs treatment right away. Hospitalization is often needed. HOME CARE INSTRUCTIONS  Get as much rest as  you can. Nap when the baby sleeps.  Exercise regularly. Some women find yoga and walking to be beneficial.  Eat a balanced and nourishing diet.  Do little things that you enjoy. Have a cup of tea, take a bubble bath, read your  favorite magazine, or listen to your favorite music.  Avoid alcohol.  Ask for help with household chores, cooking, grocery shopping, or running errands as needed. Do not try to do everything.  Talk to people close to you about how you are feeling. Get support from your partner, family members, friends, or other new moms.  Try to stay positive in how you think. Think about the things you are grateful for.  Do not spend a lot of time alone.  Only take medicine as directed by your caregiver.  Keep all your postpartum appointments.  Let your caregiver know if you have any concerns. SEEK MEDICAL CARE IF: You are having a reaction or problems with your medicine. SEEK IMMEDIATE MEDICAL CARE IF:  You have suicidal feelings.  You feel you may harm the baby or someone else. Document Released: 06/26/2004 Document Revised: 12/15/2011 Document Reviewed: 07/04/2013 [facility] Patient Information 2014 [place], [place].

## 2013-12-08 ENCOUNTER — Encounter: Payer: Self-pay | Admitting: Internal Medicine

## 2013-12-12 ENCOUNTER — Other Ambulatory Visit: Payer: Self-pay | Admitting: Emergency Medicine

## 2013-12-15 ENCOUNTER — Encounter: Payer: Self-pay | Admitting: Emergency Medicine

## 2013-12-15 ENCOUNTER — Ambulatory Visit (INDEPENDENT_AMBULATORY_CARE_PROVIDER_SITE_OTHER): Payer: BC Managed Care – PPO | Admitting: Emergency Medicine

## 2013-12-15 VITALS — BP 120/82 | HR 88 | Temp 98.7°F | Resp 16 | Wt 186.0 lb

## 2013-12-15 DIAGNOSIS — R5381 Other malaise: Secondary | ICD-10-CM

## 2013-12-15 DIAGNOSIS — N926 Irregular menstruation, unspecified: Secondary | ICD-10-CM

## 2013-12-15 DIAGNOSIS — N939 Abnormal uterine and vaginal bleeding, unspecified: Secondary | ICD-10-CM

## 2013-12-15 DIAGNOSIS — R5383 Other fatigue: Principal | ICD-10-CM

## 2013-12-15 LAB — HEPATIC FUNCTION PANEL
ALK PHOS: 73 U/L (ref 39–117)
ALT: 17 U/L (ref 0–35)
AST: 16 U/L (ref 0–37)
Albumin: 3.9 g/dL (ref 3.5–5.2)
BILIRUBIN INDIRECT: 0.3 mg/dL (ref 0.2–1.2)
BILIRUBIN TOTAL: 0.4 mg/dL (ref 0.2–1.2)
Bilirubin, Direct: 0.1 mg/dL (ref 0.0–0.3)
Total Protein: 7.1 g/dL (ref 6.0–8.3)

## 2013-12-15 LAB — CBC WITH DIFFERENTIAL/PLATELET
BASOS ABS: 0.1 10*3/uL (ref 0.0–0.1)
Basophils Relative: 1 % (ref 0–1)
EOS PCT: 3 % (ref 0–5)
Eosinophils Absolute: 0.2 10*3/uL (ref 0.0–0.7)
HEMATOCRIT: 37.2 % (ref 36.0–46.0)
HEMOGLOBIN: 12.5 g/dL (ref 12.0–15.0)
Lymphocytes Relative: 32 % (ref 12–46)
Lymphs Abs: 2.3 10*3/uL (ref 0.7–4.0)
MCH: 27.3 pg (ref 26.0–34.0)
MCHC: 33.6 g/dL (ref 30.0–36.0)
MCV: 81.2 fL (ref 78.0–100.0)
MONOS PCT: 8 % (ref 3–12)
Monocytes Absolute: 0.6 10*3/uL (ref 0.1–1.0)
Neutro Abs: 4 10*3/uL (ref 1.7–7.7)
Neutrophils Relative %: 56 % (ref 43–77)
Platelets: 315 10*3/uL (ref 150–400)
RBC: 4.58 MIL/uL (ref 3.87–5.11)
RDW: 15.5 % (ref 11.5–15.5)
WBC: 7.1 10*3/uL (ref 4.0–10.5)

## 2013-12-15 LAB — BASIC METABOLIC PANEL WITH GFR
BUN: 15 mg/dL (ref 6–23)
CHLORIDE: 104 meq/L (ref 96–112)
CO2: 26 meq/L (ref 19–32)
CREATININE: 0.9 mg/dL (ref 0.50–1.10)
Calcium: 9.6 mg/dL (ref 8.4–10.5)
GFR, Est African American: >89 mL/min
GFR, Est Non African American: 88 mL/min
Glucose, Bld: 82 mg/dL (ref 70–99)
POTASSIUM: 4.8 meq/L (ref 3.5–5.3)
Sodium: 137 mEq/L (ref 135–145)

## 2013-12-15 NOTE — Patient Instructions (Signed)
Menorrhagia  Menorrhagia is when your menstrual periods are heavy or last longer than usual.   HOME CARE  · Only take medicine as told by your doctor.  · Take any iron pills as told by your doctor. Heavy bleeding may cause low levels of iron in your body.  · Do not take aspirin 1 week before or during your period. Aspirin can make the bleeding worse.  · Lie down for a while if you change your tampon or pad more than once in 2 hours. This may help lessen the bleeding.  · Eat a healthy diet and foods with iron. These foods include leafy green vegetables, meat, liver, eggs, and whole grain breads and cereals.  · Do not try to lose weight. Wait until the heavy bleeding has stopped and your iron level is normal.  GET HELP IF:  · You soak through a pad or tampon every 1 or 2 hours, and this happens every time you have a period.  · You need to use pads and tampons at the same time because you are bleeding so much.  · You need to change your pad or tampon during the night.  · You have a period that lasts for more than 8 days.  · You pass clots bigger than 1 inch (2.5 cm) wide.  · You have irregular periods that happen more or less often than once a month.  · You feel dizzy or pass out (faint).  · You feel very weak or tired.  · You feel short of breath or feel your heart is beating too fast when you exercise.  · You feel sick to your stomach (nausea) and you throw up (vomit) while you are taking your medicine.    · You have watery poop (diarrhea) while you are taking your medicine.  · You have any problems that may be related to the medicine you are taking.    GET HELP RIGHT AWAY IF:  · You soak through 4 or more pads or tampons in 2 hours.  · You have any bleeding while you are pregnant.  MAKE SURE YOU:   · Understand these instructions.  · Will watch your condition.  · Will get help right away if you are not doing well or get worse.  Document Released: 07/01/2008 Document Revised: 05/25/2013 Document Reviewed:  03/24/2013  ExitCare® Patient Information ©2014 [employer].

## 2013-12-15 NOTE — Progress Notes (Signed)
Subjective:    Patient ID: Eileen Rios, female    DOB: 08/29/1986, 28 y.o.   MRN: [id number]  HPI Comments: 28 yo female with bleeding heavy x 1 week. She has been on multiple antibiotics for cdiff and is still having BM 6-7 x days. She has f/u with DR Andrey Campanile pending. She has been taking her OCP AD. She notes menses type cramps have improved a little today. She also notes this is the 1st time she has tried the 3 month nonstop OCP. She notes with the heavy bleeding there have been large clots. She denies any blood disorders.   Vaginal Bleeding   Current Outpatient Prescriptions on File Prior to Visit  Medication Sig Dispense Refill  . butalbital-acetaminophen-caffeine (FIORICET) 50-325-40 MG per tablet Take 1-2 tablets by mouth every 6 (six) hours as needed for headache.  45 tablet  0  . Cholecalciferol (VITAMIN D3) 5000 UNITS TABS Take 5,000 Units by mouth at bedtime.      . clindamycin (CLINDAGEL) 1 % gel Apply 1 application topically 2 (two) times daily.      . Drospirenone-Ethinyl Estradiol-Levomefol (SAFYRAL) 3-0.03-0.451 MG tablet Take 1 tablet by mouth daily.      Marland Kitchen esomeprazole (NEXIUM) 20 MG capsule Take 20 mg by mouth daily at 12 noon.      . Ferrous Sulfate Dried (SLOW IRON PO) Take 1 tablet by mouth at bedtime.       . hydrocortisone (ANUSOL-HC) 2.5 % rectal cream Place 1 application rectally daily as needed for hemorrhoids or itching.      . hyoscyamine (LEVSIN/SL) 0.125 MG SL tablet Take 1 tablet (0.125 mg total) by mouth every 4 (four) hours as needed for cramping (nausea, diarrhea).  50 tablet  1  . ibuprofen (ADVIL,MOTRIN) 200 MG tablet Take 400-600 mg by mouth every 6 (six) hours as needed for headache.      . Multiple Vitamin (MULTIVITAMIN WITH MINERALS) TABS tablet Take 1 tablet by mouth daily.      . ondansetron (ZOFRAN ODT) 4 MG disintegrating tablet Take 1 tablet (4 mg total) by mouth every 8 (eight) hours as needed for nausea or vomiting.  30 tablet  1  .  ondansetron (ZOFRAN) 4 MG tablet Take 1 tablet (4 mg total) by mouth daily as needed for nausea or vomiting.  30 tablet  1  . promethazine (PHENERGAN) 25 MG tablet Take 1 tablet (25 mg total) by mouth every 6 (six) hours as needed for nausea or vomiting.  30 tablet  0  . vancomycin (VANCOCIN) 250 MG capsule Take 250 mg by mouth 4 (four) times daily.       No current facility-administered medications on file prior to visit.   No Known Allergies Past Medical History  Diagnosis Date  . Hypertension   . Headache(784.0)     migraines  . IBS (irritable bowel syndrome)   . Seasonal allergies   . Insulin resistance        Review of Systems  Constitutional: Positive for fatigue.  Genitourinary: Positive for vaginal bleeding.  All other systems reviewed and are negative.   BP 120/82  Pulse 88  Temp(Src) 98.7 F (37.1 C)  Resp 16  Wt 186 lb (84.369 kg)     Objective:   Physical Exam  Nursing note and vitals reviewed. Constitutional: She is oriented to person, place, and time. She appears well-developed and well-nourished.  HENT:  Head: Normocephalic and atraumatic.  Eyes: Conjunctivae are normal.  Neck: Normal range  of motion.  Cardiovascular: Normal rate, regular rhythm, normal heart sounds and intact distal pulses.   Pulmonary/Chest: Effort normal and breath sounds normal.  Abdominal: Soft. Bowel sounds are normal. She exhibits no distension and no mass. There is no tenderness. There is no rebound and no guarding.  Genitourinary:  Def to pelvic U/S, if NEG will go to GYN for pap  Musculoskeletal: Normal range of motion.  Neurological: She is alert and oriented to person, place, and time.  Skin: Skin is warm and dry.  Psychiatric: She has a normal mood and affect. Judgment normal.          Assessment & Plan:  1. Abnormal menses- ? OCP and ABX use vs fibroid, check labs/ Pelvic U/S, w/c if SX increase or ER.  2. Fatigue- check labs, increase activity and H2O

## 2013-12-16 ENCOUNTER — Other Ambulatory Visit: Payer: Self-pay

## 2013-12-16 DIAGNOSIS — N926 Irregular menstruation, unspecified: Secondary | ICD-10-CM

## 2013-12-16 LAB — HCG, QUANTITATIVE, PREGNANCY: hCG, Beta Chain, Quant, S: <2 m[IU]/mL

## 2013-12-16 LAB — TSH: TSH: 0.932 u[IU]/mL (ref 0.350–4.500)

## 2013-12-19 ENCOUNTER — Other Ambulatory Visit: Payer: Self-pay | Admitting: Physician Assistant

## 2013-12-19 MED ORDER — CLINDAMYCIN PHOSPHATE 1 % EX GEL: 1.0000 "application " | Freq: Two times a day (BID) | CUTANEOUS | Status: DC

## 2013-12-23 ENCOUNTER — Ambulatory Visit (HOSPITAL_COMMUNITY)
Admission: RE | Admit: 2013-12-23 | Discharge: 2013-12-23 | Disposition: A | Discharge status: Home / Self Care | Payer: BC Managed Care – PPO | Source: Ambulatory Visit | Attending: Emergency Medicine | Admitting: Emergency Medicine

## 2013-12-23 DIAGNOSIS — N920 Excessive and frequent menstruation with regular cycle: Secondary | ICD-10-CM | POA: Insufficient documentation

## 2013-12-23 DIAGNOSIS — N926 Irregular menstruation, unspecified: Secondary | ICD-10-CM

## 2014-01-02 ENCOUNTER — Ambulatory Visit (INDEPENDENT_AMBULATORY_CARE_PROVIDER_SITE_OTHER): Payer: BC Managed Care – PPO | Admitting: Physician Assistant

## 2014-01-02 ENCOUNTER — Encounter: Payer: Self-pay | Admitting: Physician Assistant

## 2014-01-02 ENCOUNTER — Encounter: Payer: Self-pay | Admitting: Internal Medicine

## 2014-01-02 VITALS — BP 122/88 | HR 88 | Temp 98.1°F | Resp 16 | Wt 184.0 lb

## 2014-01-02 DIAGNOSIS — J020 Streptococcal pharyngitis: Secondary | ICD-10-CM

## 2014-01-02 MED ORDER — AZITHROMYCIN 250 MG PO TABS: ORAL_TABLET | ORAL | Status: AC

## 2014-01-02 MED ORDER — HYDROCODONE-ACETAMINOPHEN 5-325 MG PO TABS: 1.0000 | ORAL_TABLET | Freq: Four times a day (QID) | ORAL | Status: DC | PRN

## 2014-01-02 NOTE — Progress Notes (Signed)
   Subjective:    Patient ID: Eileen Rios, female    DOB: [birth year], 28 y.o.   MRN: [id number]  28 y.o.female with 696-267 month old who recently had an ear infection presents with sore throat.  Sore Throat  This is a new problem. Episode onset: 3-4 days. The maximum temperature recorded prior to her arrival was 102 - 102.9 F. The fever has been present for 1 to 2 days. The pain is moderate. Associated symptoms include congestion, coughing, ear pain, headaches, swollen glands and trouble swallowing. Pertinent negatives include no abdominal pain, diarrhea, drooling, ear discharge, hoarse voice, plugged ear sensation, neck pain, shortness of breath, stridor or vomiting. Treatments tried: nyquil.    Review of Systems  HENT: Positive for congestion, ear pain and trouble swallowing. Negative for drooling, ear discharge and hoarse voice.   Respiratory: Positive for cough. Negative for shortness of breath and stridor.   Gastrointestinal: Negative for vomiting, abdominal pain and diarrhea.  Musculoskeletal: Negative for neck pain.  Neurological: Positive for headaches.       Objective:   Physical Exam  Constitutional: She appears well-developed and well-nourished.  HENT:  Head: Normocephalic and atraumatic.  Right Ear: External ear normal.  Left Ear: External ear normal.  Mouth/Throat: Uvula is midline and mucous membranes are normal. Posterior oropharyngeal edema and posterior oropharyngeal erythema present.  Eyes: Conjunctivae and EOM are normal. Pupils are equal, round, and reactive to light.  Neck: Normal range of motion. Neck supple.  Cardiovascular: Normal rate, regular rhythm and normal heart sounds.   Pulmonary/Chest: Effort normal and breath sounds normal.  Abdominal: Soft. Bowel sounds are normal.  Lymphadenopathy:    She has cervical adenopathy.  Skin: Skin is warm and dry.      Assessment & Plan:  Strep pharyngitis - Plan: POCT rapid strep A  Neg  strep Zpak Prednisone Salt water gargles. You can also do 1 TSP liquid Maalox and 1 TSP liquid benadryl- mix/ gargle/ spit

## 2014-01-02 NOTE — Patient Instructions (Signed)
Pharyngitis °Pharyngitis is redness, pain, and swelling (inflammation) of your pharynx.  °CAUSES  °Pharyngitis is usually caused by infection. Most of the time, these infections are from viruses (viral) and are part of a cold. However, sometimes pharyngitis is caused by bacteria (bacterial). Pharyngitis can also be caused by allergies. Viral pharyngitis may be spread from person to person by coughing, sneezing, and personal items or utensils (cups, forks, spoons, toothbrushes). Bacterial pharyngitis may be spread from person to person by more intimate contact, such as kissing.  °SIGNS AND SYMPTOMS  °Symptoms of pharyngitis include:   °· Sore throat.   °· Tiredness (fatigue).   °· Low-grade fever.   °· Headache. °· Joint pain and muscle aches. °· Skin rashes. °· Swollen lymph nodes. °· Plaque-like film on throat or tonsils (often seen with bacterial pharyngitis). °DIAGNOSIS  °Your health care provider will ask you questions about your illness and your symptoms. Your medical history, along with a physical exam, is often all that is needed to diagnose pharyngitis. Sometimes, a rapid strep test is done. Other lab tests may also be done, depending on the suspected cause.  °TREATMENT  °Viral pharyngitis will usually get better in 3 4 days without the use of medicine. Bacterial pharyngitis is treated with medicines that kill germs (antibiotics).  °HOME CARE INSTRUCTIONS  °· Drink enough water and fluids to keep your urine clear or pale yellow.   °· Only take over-the-counter or prescription medicines as directed by your health care provider:   °· If you are prescribed antibiotics, make sure you finish them even if you start to feel better.   °· Do not take aspirin.   °· Get lots of rest.   °· Gargle with 8 oz of salt water (½ tsp of salt per 1 qt of water) as often as every 1 2 hours to soothe your throat.   °· Throat lozenges (if you are not at risk for choking) or sprays may be used to soothe your throat. °SEEK MEDICAL  CARE IF:  °· You have large, tender lumps in your neck. °· You have a rash. °· You cough up green, yellow-brown, or bloody spit. °SEEK IMMEDIATE MEDICAL CARE IF:  °· Your neck becomes stiff. °· You drool or are unable to swallow liquids. °· You vomit or are unable to keep medicines or liquids down. °· You have severe pain that does not go away with the use of recommended medicines. °· You have trouble breathing (not caused by a stuffy nose). °MAKE SURE YOU:  °· Understand these instructions. °· Will watch your condition. °· Will get help right away if you are not doing well or get worse. °Document Released: 09/22/2005 Document Revised: 07/13/2013 Document Reviewed: 05/30/2013 °Exit[employer]® Patient Information ©2014 [employer]. ° °

## 2014-01-03 ENCOUNTER — Other Ambulatory Visit: Payer: Self-pay | Admitting: Physician Assistant

## 2014-01-03 LAB — POCT RAPID STREP A (OFFICE): RAPID STREP A SCREEN: NEGATIVE

## 2014-01-03 MED ORDER — AMOXICILLIN-POT CLAVULANATE 875-125 MG PO TABS: 1.0000 | ORAL_TABLET | Freq: Two times a day (BID) | ORAL | Status: AC

## 2014-01-03 MED ORDER — PREDNISONE 20 MG PO TABS: ORAL_TABLET | ORAL | Status: DC

## 2014-01-04 ENCOUNTER — Ambulatory Visit: Payer: Self-pay | Admitting: Physician Assistant

## 2014-01-05 ENCOUNTER — Encounter: Payer: Self-pay | Admitting: Internal Medicine

## 2014-01-06 ENCOUNTER — Other Ambulatory Visit: Payer: Self-pay | Admitting: Physician Assistant

## 2014-01-06 ENCOUNTER — Ambulatory Visit (INDEPENDENT_AMBULATORY_CARE_PROVIDER_SITE_OTHER): Payer: BC Managed Care – PPO | Admitting: Physician Assistant

## 2014-01-06 DIAGNOSIS — J029 Acute pharyngitis, unspecified: Secondary | ICD-10-CM

## 2014-01-06 DIAGNOSIS — R5383 Other fatigue: Secondary | ICD-10-CM

## 2014-01-06 DIAGNOSIS — R5381 Other malaise: Secondary | ICD-10-CM

## 2014-01-06 DIAGNOSIS — Z113 Encounter for screening for infections with a predominantly sexual mode of transmission: Secondary | ICD-10-CM

## 2014-01-06 LAB — BASIC METABOLIC PANEL WITH GFR
BUN: 15 mg/dL (ref 6–23)
CO2: 24 mEq/L (ref 19–32)
Calcium: 9.5 mg/dL (ref 8.4–10.5)
Chloride: 101 mEq/L (ref 96–112)
Creat: 0.79 mg/dL (ref 0.50–1.10)
GFR, Est African American: >89 mL/min
GFR, Est Non African American: >89 mL/min
GLUCOSE: 84 mg/dL (ref 70–99)
POTASSIUM: 3.7 meq/L (ref 3.5–5.3)
SODIUM: 137 meq/L (ref 135–145)

## 2014-01-06 LAB — CBC WITH DIFFERENTIAL/PLATELET
BASOS PCT: 1 % (ref 0–1)
Basophils Absolute: 0.1 10*3/uL (ref 0.0–0.1)
Eosinophils Absolute: 0 10*3/uL (ref 0.0–0.7)
Eosinophils Relative: 0 % (ref 0–5)
HEMATOCRIT: 34.6 % — AB (ref 36.0–46.0)
Hemoglobin: 12.3 g/dL (ref 12.0–15.0)
LYMPHS PCT: 27 % (ref 12–46)
Lymphs Abs: 3.1 10*3/uL (ref 0.7–4.0)
MCH: 28.3 pg (ref 26.0–34.0)
MCHC: 35.5 g/dL (ref 30.0–36.0)
MCV: 79.5 fL (ref 78.0–100.0)
MONOS PCT: 8 % (ref 3–12)
Monocytes Absolute: 0.9 10*3/uL (ref 0.1–1.0)
NEUTROS PCT: 64 % (ref 43–77)
Neutro Abs: 7.4 10*3/uL (ref 1.7–7.7)
Platelets: 306 10*3/uL (ref 150–400)
RBC: 4.35 MIL/uL (ref 3.87–5.11)
RDW: 14.6 % (ref 11.5–15.5)
WBC: 11.5 10*3/uL — ABNORMAL HIGH (ref 4.0–10.5)

## 2014-01-06 LAB — HIV ANTIBODY (ROUTINE TESTING W REFLEX): HIV: NONREACTIVE

## 2014-01-06 MED ORDER — NYSTATIN 100000 UNIT/ML MT SUSP: OROMUCOSAL | Status: DC

## 2014-01-06 MED ORDER — CEFTRIAXONE SODIUM 1 G IJ SOLR
1.0000 g | Freq: Once | INTRAMUSCULAR | Status: AC
  Administered 2014-01-06: 1 g via INTRAMUSCULAR

## 2014-01-06 MED ORDER — PREDNISONE 20 MG PO TABS: ORAL_TABLET | ORAL | Status: DC

## 2014-01-06 MED ORDER — FLUCONAZOLE 150 MG PO TABS: 150.0000 mg | ORAL_TABLET | Freq: Every day | ORAL | Status: DC

## 2014-01-06 NOTE — Patient Instructions (Signed)
Peritonsillar Abscess A peritonsillar abscess is a collection of pus located in the back of the throat behind the tonsils. It usually occurs when a streptococcal infection of the throat or tonsils spreads into the space around the tonsils. They are almost always caused by the streptococcal germ (bacteria). The treatment of a peritonsillar abscess is most often drainage accomplished by putting a needle into the abscess or cutting (incising) and draining the abscess. This is most often followed with a course of antibiotics. HOME CARE INSTRUCTIONS  If your abscess was drained by your caregiver today, rinse your throat (gargle) with warm salt water four times per day or as needed for comfort. Do not swallow this mixture. Mix 1 teaspoon of salt in 8 ounces of warm water for gargling.  Rest in bed as needed. Resume activities as able.  Apply cold to your neck for pain relief. Fill a plastic bag with ice and wrap it in a towel. Hold the ice on your neck for 20 minutes 4 times per day.  Eat a soft or liquid diet as tolerated while your throat remains sore. Popsicles and ice cream may be good early choices. Drinking plenty of cold fluids will probably be soothing and help take swelling down in between the warm gargles.  Only take over-the-counter or prescription medicines for pain, discomfort, or fever as directed by your caregiver. Do not use aspirin unless directed by your physician. Aspirin slows down the clotting process. It can also cause bleeding from the drainage area if this was needled or incised today.  If antibiotics were prescribed, take them as directed for the full course of the prescription. Even if you feel you are well, you need to take them. SEEK MEDICAL CARE IF:   You have increased pain, swelling, redness, or drainage in your throat.  You develop signs of infection such as dizziness, headache, lethargy, or generalized feelings of illness.  You have difficulty breathing, swallowing or  eating.  You show signs of becoming dehydrated (lightheadedness when standing, decreased urine output, a fast heart rate, or dry mouth and mucous membranes). SEEK IMMEDIATE MEDICAL CARE IF:   You have a fever.  You are coughing up or vomiting blood.  You develop more severe throat pain uncontrolled with medicines or you start to drool.  You develop difficulty breathing, talking, or find it easier to breathe while leaning forward. Document Released: 09/22/2005 Document Revised: 12/15/2011 Document Reviewed: 05/05/2008 Exit[employer] Patient Information 2014 [employer].  

## 2014-01-06 NOTE — Progress Notes (Signed)
   Subjective:    Patient ID: Eileen CapeSamantha M Klebba, female    DOB: [birth year], 28 y.o.   MRN: [id number]  HPI Patient presents with continuing throat pain, has improved some since switching zpak to Augmentin. States had trouble swallowing/breathing Tuesday but it got better with switch in ABX to Augmentin.  Still with right sided throat swelling, tenderness, exudates, fatigue. Fever has improved. Patient had recent Cdiff treatment, had negative HIV testing in 2012. Denies rash, joint pain, headaches, trismus, nausea, SOB, CP. She is having diarrhea likely from the Augmentin.   Review of Systems  HENT: Positive for congestion, ear pain and trouble swallowing. Negative for drooling, ear discharge and hoarse voice.   Respiratory: Positive for cough. Negative for shortness of breath and stridor.   Gastrointestinal: Negative for vomiting, abdominal pain and diarrhea.  Musculoskeletal: Negative for neck pain.  Neurological: Positive for headaches.       Objective:   Physical Exam  Constitutional: She appears well-developed and well-nourished.  HENT:  Head: Normocephalic and atraumatic.  Right Ear: External ear normal.  Left Ear: External ear normal.  Mouth/Throat: Uvula is midline and mucous membranes are normal. Oropharyngeal exudate, posterior oropharyngeal edema and posterior oropharyngeal erythema present.  Eyes: Conjunctivae and EOM are normal. Pupils are equal, round, and reactive to light.  Neck: Normal range of motion. Neck supple.  Cardiovascular: Normal rate, regular rhythm and normal heart sounds.   Pulmonary/Chest: Effort normal and breath sounds normal.  Abdominal: Soft. Bowel sounds are normal.  Lymphadenopathy:    She has cervical adenopathy (right sided).  Skin: Skin is warm and dry.          Assessment & Plan:  Pharyngitis vs Peritonsillar Abscess- rochepin shot, continue Augmentin, diflucan given, mouth wash given, continue Prednsione, Get throat culture, CBC, BMP, mono,  HIV test.

## 2014-01-07 ENCOUNTER — Encounter: Payer: Self-pay | Admitting: Internal Medicine

## 2014-01-07 LAB — EPSTEIN-BARR VIRUS VCA ANTIBODY PANEL
EBV EA IGG: 88.1 U/mL — AB (ref <9.0)
EBV NA IGG: 437 U/mL — AB (ref <18.0)
EBV VCA IGM: 13 U/mL (ref <36.0)
EBV VCA IgG: >750 U/mL — ABNORMAL HIGH (ref <18.0)

## 2014-01-10 ENCOUNTER — Ambulatory Visit: Payer: Self-pay | Admitting: Physician Assistant

## 2014-01-10 ENCOUNTER — Encounter: Payer: Self-pay | Admitting: Internal Medicine

## 2014-01-10 DIAGNOSIS — J029 Acute pharyngitis, unspecified: Secondary | ICD-10-CM

## 2014-01-10 LAB — CULTURE, GROUP A STREP: ORGANISM ID, BACTERIA: NORMAL

## 2014-01-12 ENCOUNTER — Encounter: Payer: Self-pay | Admitting: Physician Assistant

## 2014-01-12 ENCOUNTER — Ambulatory Visit (INDEPENDENT_AMBULATORY_CARE_PROVIDER_SITE_OTHER): Payer: BC Managed Care – PPO | Admitting: Physician Assistant

## 2014-01-12 VITALS — BP 122/78 | HR 88 | Temp 97.9°F | Resp 16 | Wt 184.0 lb

## 2014-01-12 DIAGNOSIS — R5383 Other fatigue: Secondary | ICD-10-CM

## 2014-01-12 DIAGNOSIS — R5381 Other malaise: Secondary | ICD-10-CM

## 2014-01-12 DIAGNOSIS — D649 Anemia, unspecified: Secondary | ICD-10-CM

## 2014-01-12 DIAGNOSIS — R42 Dizziness and giddiness: Secondary | ICD-10-CM

## 2014-01-12 NOTE — Progress Notes (Signed)
   Subjective:    Patient ID: Eileen Rios, female    DOB: [birth year], 28 y.o.   MRN: [id number]  HPI 28 y.o. 6 months post partum with history of Cdiff which she has received treatment and is no longer having diarrhea, and most recently she has had and acute pharyngitis that was fast and culture strep negative. She had some much right tonsillar swelling, there was a question of a tonsillar abscess. She was put on ABX and prednisone and presents today. She states her throat is much better but continues to have fatigue, and states she has occ dizziness, feels off balance, with some vertigo feeling to it. She describes 1 episode of leg weakness, she went to pee and said she felt the floor "move under her" and then it resolved.  She can not recall anything that elicits the dizziness, she states that first thing in the morning she feels good but after 3-4 hours of working/moving around she has fatigue.  She has a history of headaches, She denies diplopia.   She is currently on her menses.   Review of Systems  Constitutional: Positive for fatigue. Negative for fever, chills, diaphoresis, activity change, appetite change and unexpected weight change.  HENT: Negative.   Respiratory: Negative.   Cardiovascular: Negative.   Gastrointestinal: Negative for nausea, abdominal pain, blood in stool and anal bleeding. Diarrhea: Loose stools.  Endocrine: Negative.   Genitourinary: Negative.   Musculoskeletal: Negative.   Neurological: Positive for dizziness and weakness. Negative for tremors, seizures, syncope, facial asymmetry, speech difficulty, light-headedness, numbness and headaches.       Objective:   Physical Exam  Constitutional: She is oriented to person, place, and time. She appears well-developed and well-nourished.  HENT:  Head: Normocephalic and atraumatic.  Right Ear: External ear normal.  Left Ear: External ear normal.  Mouth/Throat: Oropharynx is clear and moist.  Crowded mouth   Eyes: Conjunctivae and EOM are normal. Pupils are equal, round, and reactive to light.  Neck: Normal range of motion. Neck supple. No thyromegaly present.  Cardiovascular: Normal rate, regular rhythm and normal heart sounds.  Exam reveals no gallop and no friction rub.   No murmur heard. Pulmonary/Chest: Effort normal and breath sounds normal. No respiratory distress. She has no wheezes.  Abdominal: Soft. Bowel sounds are normal. She exhibits no distension and no mass. There is no tenderness. There is no rebound and no guarding.  Musculoskeletal: Normal range of motion.  Lymphadenopathy:    She has no cervical adenopathy.  Neurological: She is alert and oriented to person, place, and time. She displays normal reflexes. No cranial nerve deficit. Coordination normal.  Skin: Skin is warm and dry.  Psychiatric: She has a normal mood and affect.        Assessment & Plan:  Fatigue- check labs,? If needs a sleep study, ?MG vs need another MRI- had one in 2006.  She wants to be conservative at the moment, recent infection will heck labs and if continuing fatigue will get further work up

## 2014-01-12 NOTE — Patient Instructions (Signed)

## 2014-01-13 LAB — BASIC METABOLIC PANEL WITH GFR
BUN: 16 mg/dL (ref 6–23)
CALCIUM: 9.6 mg/dL (ref 8.4–10.5)
CO2: 27 mEq/L (ref 19–32)
Chloride: 98 mEq/L (ref 96–112)
Creat: 0.89 mg/dL (ref 0.50–1.10)
GFR, EST NON AFRICAN AMERICAN: 89 mL/min
GLUCOSE: 79 mg/dL (ref 70–99)
POTASSIUM: 4.5 meq/L (ref 3.5–5.3)
SODIUM: 136 meq/L (ref 135–145)

## 2014-01-13 LAB — IRON AND TIBC
%SAT: 12 % — ABNORMAL LOW (ref 20–55)
IRON: 45 ug/dL (ref 42–145)
TIBC: 388 ug/dL (ref 250–470)
UIBC: 343 ug/dL (ref 125–400)

## 2014-01-13 LAB — CBC WITH DIFFERENTIAL/PLATELET
BASOS ABS: 0.1 10*3/uL (ref 0.0–0.1)
BASOS PCT: 1 % (ref 0–1)
EOS ABS: 0.2 10*3/uL (ref 0.0–0.7)
Eosinophils Relative: 2 % (ref 0–5)
HCT: 39.6 % (ref 36.0–46.0)
HEMOGLOBIN: 13.6 g/dL (ref 12.0–15.0)
Lymphocytes Relative: 38 % (ref 12–46)
Lymphs Abs: 4 10*3/uL (ref 0.7–4.0)
MCH: 28.8 pg (ref 26.0–34.0)
MCHC: 34.3 g/dL (ref 30.0–36.0)
MCV: 83.7 fL (ref 78.0–100.0)
MONOS PCT: 8 % (ref 3–12)
Monocytes Absolute: 0.8 10*3/uL (ref 0.1–1.0)
NEUTROS ABS: 5.4 10*3/uL (ref 1.7–7.7)
Neutrophils Relative %: 51 % (ref 43–77)
Platelets: 400 10*3/uL (ref 150–400)
RBC: 4.73 MIL/uL (ref 3.87–5.11)
RDW: 15.1 % (ref 11.5–15.5)
WBC: 10.6 10*3/uL — ABNORMAL HIGH (ref 4.0–10.5)

## 2014-01-13 LAB — TSH: TSH: 0.744 u[IU]/mL (ref 0.350–4.500)

## 2014-01-13 LAB — HEPATIC FUNCTION PANEL
ALT: 35 U/L (ref 0–35)
AST: 18 U/L (ref 0–37)
Albumin: 3.8 g/dL (ref 3.5–5.2)
Alkaline Phosphatase: 90 U/L (ref 39–117)
Bilirubin, Direct: 0.1 mg/dL (ref 0.0–0.3)
Indirect Bilirubin: 0.2 mg/dL (ref 0.2–1.2)
TOTAL PROTEIN: 7.1 g/dL (ref 6.0–8.3)
Total Bilirubin: 0.3 mg/dL (ref 0.2–1.2)

## 2014-01-13 LAB — FERRITIN: FERRITIN: 33 ng/mL (ref 10–291)

## 2014-01-23 ENCOUNTER — Ambulatory Visit (INDEPENDENT_AMBULATORY_CARE_PROVIDER_SITE_OTHER): Payer: BC Managed Care – PPO | Admitting: Physician Assistant

## 2014-01-23 ENCOUNTER — Encounter: Payer: Self-pay | Admitting: Physician Assistant

## 2014-01-23 VITALS — BP 110/78 | HR 84 | Temp 98.5°F | Resp 16 | Wt 182.0 lb

## 2014-01-23 DIAGNOSIS — R197 Diarrhea, unspecified: Secondary | ICD-10-CM

## 2014-01-23 MED ORDER — PREDNISONE 20 MG PO TABS: ORAL_TABLET | ORAL | Status: DC

## 2014-01-23 MED ORDER — TRIAMCINOLONE ACETONIDE 0.5 % EX CREA: 1.0000 "application " | TOPICAL_CREAM | Freq: Three times a day (TID) | CUTANEOUS | Status: DC

## 2014-01-23 NOTE — Patient Instructions (Signed)
Cetirizine tablets What is this medicine? CETIRIZINE (se TI ra zeen) is an antihistamine. This medicine is used to treat or prevent symptoms of allergies. It is also used to help reduce itchy skin rash and hives. This medicine may be used for other purposes; ask your health care provider or pharmacist if you have questions. COMMON BRAND NAME(S): All Day Allergy , Zyrtec Hives Relief , Zyrtec What should I tell my health care provider before I take this medicine? They need to know if you have any of these conditions: -kidney disease -liver disease -an unusual or allergic reaction to cetirizine, hydroxyzine, other medicines, foods, dyes, or preservatives -pregnant or trying to get pregnant -breast-feeding How should I use this medicine? Take this medicine by mouth with a glass of water. Follow the directions on the prescription label. You can take this medicine with food or on an empty stomach. Take your medicine at regular times. Do not take more often than directed. You may need to take this medicine for several days before your symptoms improve. Talk to your pediatrician regarding the use of this medicine in children. Special care may be needed. While this drug may be prescribed for children as young as 746 years of age for selected conditions, precautions do apply. Overdosage: If you think you have taken too much of this medicine contact a poison control center or emergency room at once. NOTE: This medicine is only for you. Do not share this medicine with others. What if I miss a dose? If you miss a dose, take it as soon as you can. If it is almost time for your next dose, take only that dose. Do not take double or extra doses. What may interact with this medicine? -other medicines for colds or allergies -theophylline This list may not describe all possible interactions. Give your health care provider a list of all the medicines, herbs, non-prescription drugs, or dietary supplements you use.  Also tell them if you smoke, drink alcohol, or use illegal drugs. Some items may interact with your medicine. What should I watch for while using this medicine? Visit your doctor or health care professional for regular checks on your health. Tell your doctor if your symptoms do not improve. You may get drowsy or dizzy. Do not drive, use machinery, or do anything that needs mental alertness until you know how this medicine affects you. Do not stand or sit up quickly, especially if you are an older patient. This reduces the risk of dizzy or fainting spells. Your mouth may get dry. Chewing sugarless gum or sucking hard candy, and drinking plenty of water may help. Contact your doctor if the problem does not go away or is severe. What side effects may I notice from receiving this medicine? Side effects that you should report to your doctor or health care professional as soon as possible: -allergic reactions like skin rash, itching or hives, swelling of the face, lips, or tongue -changes in vision or hearing -fast heartbeat -high blood pressure -infection -trouble passing urine or change in the amount of urine Side effects that usually do not require medical attention (report to your doctor or health care professional if they continue or are bothersome): -irritability -loss of sleep -sore throat -stomach pain -swelling This list may not describe all possible side effects. Call your doctor for medical advice about side effects. You may report side effects to FDA at 1-800-FDA-1088. Where should I keep my medicine? Keep out of the reach of children. Store at  room temperature between 15 and 30 degrees C (59 and 86 degrees F). Throw away any unused medicine after the expiration date. NOTE: This sheet is a summary. It may not cover all possible information. If you have questions about this medicine, talk to your doctor, pharmacist, or health care provider.  2014, Elsevier/Gold Standard. (2007-11-29  18:28:02) Hives Hives are itchy, red, swollen areas of the skin. They can vary in size and location on your body. Hives can come and go for hours or several days (acute hives) or for several weeks (chronic hives). Hives do not spread from person to person (noncontagious). They may get worse with scratching, exercise, and emotional stress. CAUSES   Allergic reaction to food, additives, or drugs.  Infections, including the common cold.  Illness, such as vasculitis, lupus, or thyroid disease.  Exposure to sunlight, heat, or cold.  Exercise.  Stress.  Contact with chemicals. SYMPTOMS   Red or white swollen patches on the skin. The patches may change size, shape, and location quickly and repeatedly.  Itching.  Swelling of the hands, feet, and face. This may occur if hives develop deeper in the skin. DIAGNOSIS  Your caregiver can usually tell what is wrong by performing a physical exam. Skin or blood tests may also be done to determine the cause of your hives. In some cases, the cause cannot be determined. TREATMENT  Mild cases usually get better with medicines such as antihistamines. Severe cases may require an emergency epinephrine injection. If the cause of your hives is known, treatment includes avoiding that trigger.  HOME CARE INSTRUCTIONS   Avoid causes that trigger your hives.  Take antihistamines as directed by your caregiver to reduce the severity of your hives. Non-sedating or low-sedating antihistamines are usually recommended. Do not drive while taking an antihistamine.  Take any other medicines prescribed for itching as directed by your caregiver.  Wear loose-fitting clothing.  Keep all follow-up appointments as directed by your caregiver. SEEK MEDICAL CARE IF:   You have persistent or severe itching that is not relieved with medicine.  You have painful or swollen joints. SEEK IMMEDIATE MEDICAL CARE IF:   You have a fever.  Your tongue or lips are  swollen.  You have trouble breathing or swallowing.  You feel tightness in the throat or chest.  You have abdominal pain. These problems may be the first sign of a life-threatening allergic reaction. Call your local emergency services (911 in [place] MAKE SURE YOU:   Understand these instructions.  Will watch your condition.  Will get help right away if you are not doing well or get worse. Document Released: 09/22/2005 Document Revised: 03/23/2012 Document Reviewed: 12/16/2011 [facility] Patient Information 2014 [employer] [place].

## 2014-01-23 NOTE — Progress Notes (Signed)
   Subjective:    Patient ID: Eileen CapeSamantha M Gilkey, female    DOB: Jan 10, 1986, 28 y.o.   MRN: [id number]  Rash This is a new problem. Episode onset: 3-4 days. The problem is unchanged. The affected locations include the abdomen, face and back. The rash is characterized by itchiness and redness. She was exposed to nothing. Associated symptoms include diarrhea (3-4 times in an hour), fatigue and a sore throat. Pertinent negatives include no anorexia, congestion, cough, eye pain, facial edema, fever, joint pain, nail changes, rhinorrhea, shortness of breath or vomiting. Treatments tried: nothing. The treatment provided no relief. Her past medical history is significant for allergies and eczema.      Review of Systems  Constitutional: Positive for fatigue. Negative for fever.  HENT: Positive for postnasal drip, sneezing and sore throat. Negative for congestion, facial swelling, hearing loss, nosebleeds and rhinorrhea.   Eyes: Negative for pain.  Respiratory: Negative for cough and shortness of breath.   Gastrointestinal: Positive for abdominal pain (with diarrhea) and diarrhea (3-4 times in an hour). Negative for nausea, vomiting, constipation, blood in stool, abdominal distention, anal bleeding, rectal pain and anorexia.  Musculoskeletal: Negative.  Negative for joint pain.  Skin: Positive for rash. Negative for nail changes.  Psychiatric/Behavioral: Negative.        Objective:   Physical Exam  Constitutional: She is oriented to person, place, and time. She appears well-developed and well-nourished.  HENT:  Head: Normocephalic and atraumatic.  Right Ear: External ear normal.  Left Ear: External ear normal.  Mouth/Throat: Oropharynx is clear and moist.  Eyes: Conjunctivae and EOM are normal. Pupils are equal, round, and reactive to light.  Neck: Normal range of motion. Neck supple. No thyromegaly present.  Cardiovascular: Normal rate, regular rhythm and normal heart sounds.  Exam reveals no  gallop and no friction rub.   No murmur heard. Pulmonary/Chest: Effort normal and breath sounds normal. No respiratory distress. She has no wheezes.  Abdominal: Soft. Bowel sounds are normal. She exhibits no distension and no mass. There is no tenderness. There is no rebound and no guarding.  Musculoskeletal: Normal range of motion.  Lymphadenopathy:    She has no cervical adenopathy.  Neurological: She is alert and oriented to person, place, and time. She displays normal reflexes. No cranial nerve deficit. Coordination normal.  Skin: Skin is warm and dry. Rash (small diffuse on back, abdomen tiny macules, some erythema on face with small nontender white papules. ) noted.  Psychiatric: She has a normal mood and affect.       Assessment & Plan:  Allergies?- get on zyrtec, get back on acne medication, prednisone, triamcinolone Diarrhea with recent Cdiff treatment- recheck for cdiff

## 2014-01-24 ENCOUNTER — Other Ambulatory Visit: Payer: BC Managed Care – PPO

## 2014-01-25 ENCOUNTER — Encounter: Payer: Self-pay | Admitting: Internal Medicine

## 2014-01-26 ENCOUNTER — Other Ambulatory Visit: Payer: Self-pay | Admitting: Emergency Medicine

## 2014-01-26 LAB — CLOSTRIDIUM DIFFICILE EIA: CDIFTX: POSITIVE

## 2014-01-26 MED ORDER — VANCOMYCIN HCL 125 MG PO CAPS: ORAL_CAPSULE | ORAL | Status: DC

## 2014-02-16 ENCOUNTER — Encounter: Payer: Self-pay | Admitting: Internal Medicine

## 2014-02-16 ENCOUNTER — Encounter: Payer: Self-pay | Admitting: Physician Assistant

## 2014-02-16 ENCOUNTER — Ambulatory Visit (INDEPENDENT_AMBULATORY_CARE_PROVIDER_SITE_OTHER): Payer: BC Managed Care – PPO | Admitting: Physician Assistant

## 2014-02-16 ENCOUNTER — Ambulatory Visit: Payer: Self-pay | Admitting: Physician Assistant

## 2014-02-16 VITALS — BP 120/82 | HR 80 | Temp 98.1°F | Resp 16 | Wt 182.0 lb

## 2014-02-16 DIAGNOSIS — R5381 Other malaise: Secondary | ICD-10-CM

## 2014-02-16 DIAGNOSIS — F411 Generalized anxiety disorder: Secondary | ICD-10-CM

## 2014-02-16 DIAGNOSIS — E8881 Metabolic syndrome: Secondary | ICD-10-CM

## 2014-02-16 DIAGNOSIS — E669 Obesity, unspecified: Secondary | ICD-10-CM

## 2014-02-16 DIAGNOSIS — R5383 Other fatigue: Principal | ICD-10-CM

## 2014-02-16 MED ORDER — CYCLOBENZAPRINE HCL 10 MG PO TABS: 10.0000 mg | ORAL_TABLET | Freq: Every day | ORAL | Status: DC

## 2014-02-16 MED ORDER — ESCITALOPRAM OXALATE 20 MG PO TABS: 20.0000 mg | ORAL_TABLET | Freq: Every day | ORAL | Status: DC

## 2014-02-16 NOTE — Progress Notes (Signed)
   Subjective:    Patient ID: Eileen Rios, female    DOB: 30-Dec-1985, 28 y.o.   MRN: [id number]  HPI 28 y.o. post partum, recent Cdiff infection presents for anxiety. She is on a new BCP pill and she is spotting right now. She is still on vancomycin PO for C diff. She normally takes xanax as needed every 3 weeks, but she states that recently she is taking every night. She states she is grinding her teeth, she can't turn off her mind.   Review of Systems  Constitutional: Positive for fatigue. Negative for chills, diaphoresis, activity change and appetite change.  HENT: Negative.   Respiratory: Negative.   Cardiovascular: Negative.   Gastrointestinal: Positive for nausea and abdominal pain. Negative for diarrhea.  Genitourinary: Negative.   Musculoskeletal: Negative.   Neurological: Negative.   Psychiatric/Behavioral: Positive for sleep disturbance. Negative for suicidal ideas, hallucinations, behavioral problems, confusion, self-injury, dysphoric mood, decreased concentration and agitation. The patient is nervous/anxious. The patient is not hyperactive.        Objective:   Physical Exam  Vitals reviewed. Constitutional: She is oriented to person, place, and time. She appears well-developed and well-nourished.  Neck: Normal range of motion. Neck supple.  Cardiovascular: Normal rate and regular rhythm.   Pulmonary/Chest: Effort normal and breath sounds normal.  Abdominal: Soft. Bowel sounds are normal. She exhibits no mass. There is tenderness. There is guarding. There is no rebound.  Neurological: She is alert and oriented to person, place, and time.  Skin: Skin is warm and dry. No rash noted.      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  Flexeril 10mg  QHS.

## 2014-02-16 NOTE — Patient Instructions (Signed)
Take 1/2 pill daily of the lexapro Generalized Anxiety Disorder Generalized anxiety disorder (GAD) is a mental disorder. It interferes with life functions, including relationships, work, and school. GAD is different from normal anxiety, which everyone experiences at some point in their lives in response to specific life events and activities. Normal anxiety actually helps us prepare for and get through these life events and activities. Normal anxiety goes away after the event or activity is over.  GAD causes anxiety that is not necessarily related to specific events or activities. It also causes excess anxiety in proportion to specific events or activities. The anxiety associated with GAD is also difficult to control. GAD can vary from mild to severe. People with severe GAD can have intense waves of anxiety with physical symptoms (panic attacks).  SYMPTOMS The anxiety and worry associated with GAD are difficult to control. This anxiety and worry are related to many life events and activities and also occur more days than not for 6 months or longer. People with GAD also have three or more of the following symptoms (one or more in children):  Restlessness.   Fatigue.  Difficulty concentrating.   Irritability.  Muscle tension.  Difficulty sleeping or unsatisfying sleep. DIAGNOSIS GAD is diagnosed through an assessment by your caregiver. Your caregiver will ask you questions aboutyour mood,physical symptoms, and events in your life. Your caregiver may ask you about your medical history and use of alcohol or drugs, including prescription medications. Your caregiver may also do a physical exam and blood tests. Certain medical conditions and the use of certain substances can cause symptoms similar to those associated with GAD. Your caregiver may refer you to a mental health specialist for further evaluation. TREATMENT The following therapies are usually used to treat GAD:   Medication  Antidepressant medication usually is prescribed for long-term daily control. Antianxiety medications may be added in severe cases, especially when panic attacks occur.   Talk therapy (psychotherapy) Certain types of talk therapy can be helpful in treating GAD by providing support, education, and guidance. A form of talk therapy called cognitive behavioral therapy can teach you healthy ways to think about and react to daily life events and activities.  Stress managementtechniques These include yoga, meditation, and exercise and can be very helpful when they are practiced regularly. A mental health specialist can help determine which treatment is best for you. Some people see improvement with one therapy. However, other people require a combination of therapies. Document Released: 01/17/2013 Document Reviewed: 01/17/2013 [facility] Patient Information 2014 [place], [place].  Stress Management Stress is a state of physical or mental tension that often results from changes in your life or normal routine. Some common causes of stress are:  Death of a loved one.  Injuries or severe illnesses.  Getting fired or changing jobs.  Moving into a new home. Other causes may be:  Sexual problems.  Business or financial losses.  Taking on a large debt.  Regular conflict with someone at home or at work.  Constant tiredness from lack of sleep. It is not just bad things that are stressful. It may be stressful to:  Win the lottery.  Get married.  Buy a new car. The amount of stress that can be easily tolerated varies from person to person. Changes generally cause stress, regardless of the types of change. Too much stress can affect your health. It may lead to physical or emotional problems. Too little stress (boredom) may also become stressful. SUGGESTIONS TO REDUCE STRESS:  Talk things over with your family and friends. It often is helpful to share your concerns and worries. If you feel your  problem is serious, you may want to get help from a professional counselor.  Consider your problems one at a time instead of lumping them all together. Trying to take care of everything at once may seem impossible. List all the things you need to do and then start with the most important one. Set a goal to accomplish 2 or 3 things each day. If you expect to do too many in a single day you will naturally fail, causing you to feel even more stressed.  Do not use alcohol or drugs to relieve stress. Although you may feel better for a short time, they do not remove the problems that caused the stress. They can also be habit forming.  Exercise regularly - at least 3 times per week. Physical exercise can help to relieve that "uptight" feeling and will relax you.  The shortest distance between despair and hope is often a good night's sleep.  Go to bed and get up on time allowing yourself time for appointments without being rushed.  Take a short "time-out" period from any stressful situation that occurs during the day. Close your eyes and take some deep breaths. Starting with the muscles in your face, tense them, hold it for a few seconds, then relax. Repeat this with the muscles in your neck, shoulders, hand, stomach, back and legs.  Take good care of yourself. Eat a balanced diet and get plenty of rest.  Schedule time for having fun. Take a break from your daily routine to relax. HOME CARE INSTRUCTIONS   Call if you feel overwhelmed by your problems and feel you can no longer manage them on your own.  Return immediately if you feel like hurting yourself or someone else. Document Released: 03/18/2001 Document Revised: 12/15/2011 Document Reviewed: 05/17/2013 [facility] Patient Information 2014 [place], [place].

## 2014-02-17 ENCOUNTER — Other Ambulatory Visit: Payer: Self-pay | Admitting: Physician Assistant

## 2014-02-17 MED ORDER — CYCLOBENZAPRINE HCL 10 MG PO TABS
ORAL_TABLET | ORAL | Status: DC
Start: 2014-02-17 — End: 2014-03-21

## 2014-02-17 MED ORDER — ONDANSETRON HCL 4 MG PO TABS: 4.0000 mg | ORAL_TABLET | Freq: Every day | ORAL | Status: DC | PRN

## 2014-02-18 ENCOUNTER — Encounter: Payer: Self-pay | Admitting: Internal Medicine

## 2014-02-22 ENCOUNTER — Encounter: Payer: Self-pay | Admitting: Internal Medicine

## 2014-02-22 MED ORDER — ALPRAZOLAM 1 MG PO TABS: ORAL_TABLET | ORAL | Status: DC

## 2014-02-23 ENCOUNTER — Encounter: Payer: Self-pay | Admitting: Internal Medicine

## 2014-03-21 ENCOUNTER — Encounter: Payer: Self-pay | Admitting: Internal Medicine

## 2014-03-21 ENCOUNTER — Encounter: Payer: Self-pay | Admitting: Physician Assistant

## 2014-03-21 ENCOUNTER — Ambulatory Visit (INDEPENDENT_AMBULATORY_CARE_PROVIDER_SITE_OTHER): Payer: BC Managed Care – PPO | Admitting: Physician Assistant

## 2014-03-21 VITALS — BP 128/80 | HR 76 | Temp 97.9°F | Resp 16 | Wt 180.0 lb

## 2014-03-21 DIAGNOSIS — F411 Generalized anxiety disorder: Secondary | ICD-10-CM

## 2014-03-21 DIAGNOSIS — E669 Obesity, unspecified: Secondary | ICD-10-CM

## 2014-03-21 DIAGNOSIS — G43909 Migraine, unspecified, not intractable, without status migrainosus: Secondary | ICD-10-CM

## 2014-03-21 MED ORDER — BUPROPION HCL ER (XL) 150 MG PO TB24: 150.0000 mg | ORAL_TABLET | ORAL | Status: DC

## 2014-03-21 MED ORDER — RIZATRIPTAN BENZOATE 10 MG PO TABS: ORAL_TABLET | ORAL | Status: DC

## 2014-03-21 MED ORDER — DROSPIREN-ETH ESTRAD-LEVOMEFOL 3-0.03-0.451 MG PO TABS: 1.0000 | ORAL_TABLET | Freq: Every day | ORAL | Status: DC

## 2014-03-21 MED ORDER — LEVONORGESTREL-ETHINYL ESTRAD 0.15-30 MG-MCG PO TABS: 1.0000 | ORAL_TABLET | Freq: Every day | ORAL | Status: DC

## 2014-03-21 MED ORDER — ESCITALOPRAM OXALATE 10 MG PO TABS: 10.0000 mg | ORAL_TABLET | Freq: Every day | ORAL | Status: DC

## 2014-03-21 NOTE — Progress Notes (Signed)
HPI 28 y.o.female 7-8 months post partum with recent Cdiff infection completed PO VANCO 2 weeks ago  presents for 1 month follow up after starting on lexapro for anxiety. She states that she is doing well on Lexapro 10mg  but she has decreased sex drive but she is cleaning more and feeling more motivated. She is still having trouble sleeping at night occ and takes xanax PRN for this that helps. She is also on safyrai to try to prevent migraines associated with her menstrual cycle but states they have been irregular until recently. She had a migraine Friday and fiorcet did not help stop it, she had to take a percocet from her previous surgery.   Past Medical History  Diagnosis Date  . Hypertension   . Headache(784.0)     migraines  . IBS (irritable bowel syndrome)   . Seasonal allergies   . Insulin resistance      No Known Allergies    Current Outpatient Prescriptions on File Prior to Visit  Medication Sig Dispense Refill  . ALPRAZolam (XANAX) 1 MG tablet Use 1/2-1 pill BID  60 tablet  0  . butalbital-acetaminophen-caffeine (FIORICET) 50-325-40 MG per tablet Take 1-2 tablets by mouth every 6 (six) hours as needed for headache.  45 tablet  0  . Cholecalciferol (VITAMIN D3) 5000 UNITS TABS Take 5,000 Units by mouth at bedtime.      . clindamycin (CLINDAGEL) 1 % gel Apply 1 application topically 2 (two) times daily.  30 g  2  . Drospirenone-Ethinyl Estradiol-Levomefol (SAFYRAL) 3-0.03-0.451 MG tablet Take 1 tablet by mouth daily.      Marland Kitchen escitalopram (LEXAPRO) 20 MG tablet Take 1 tablet (20 mg total) by mouth daily.  30 tablet  0  . Ferrous Sulfate Dried (SLOW IRON PO) Take 1 tablet by mouth at bedtime.       Marland Kitchen HYDROcodone-acetaminophen (NORCO) 5-325 MG per tablet Take 1 tablet by mouth every 6 (six) hours as needed for moderate pain.  30 tablet  0  . hydrocortisone (ANUSOL-HC) 2.5 % rectal cream Place 1 application rectally daily as needed for hemorrhoids or itching.      Marland Kitchen ibuprofen  (ADVIL,MOTRIN) 200 MG tablet Take 400-600 mg by mouth every 6 (six) hours as needed for headache.      . Multiple Vitamin (MULTIVITAMIN WITH MINERALS) TABS tablet Take 1 tablet by mouth daily.      . ondansetron (ZOFRAN) 4 MG tablet Take 1 tablet (4 mg total) by mouth daily as needed for nausea or vomiting.  30 tablet  2  . triamcinolone cream (KENALOG) 0.5 % Apply 1 application topically 3 (three) times daily.  80 g  1  . vancomycin (VANCOCIN) 125 MG capsule 1 po Q 6h x 14 days then 1 po BID x 7 days, then 1 po QD x 7 days  77 capsule  0   No current facility-administered medications on file prior to visit.    ROS: all negative expect above.   Physical: Filed Weights   03/21/14 1032  Weight: 180 lb (81.647 kg)   BP 128/80  Pulse 76  Temp(Src) 97.9 F (36.6 C)  Resp 16  Wt 180 lb (81.647 kg) General Appearance: Well nourished, in no apparent distress. Eyes: PERRLA, EOMs. Sinuses: No Frontal/maxillary tenderness ENT/Mouth: Ext aud canals clear, normal light reflex with TMs without erythema, bulging. Post pharynx without erythema, swelling, exudate.  Respiratory: CTAB Cardio: RRR, no murmurs, rubs or gallops. Peripheral pulses brisk and equal bilaterally, without edema. No  aortic or femoral bruits. Abdomen: Soft, with bowl sounds. Nontender, no guarding, rebound. Lymphatics: Non tender without lymphadenopathy.  Musculoskeletal: Full ROM all peripheral extremities, 5/5 strength, and normal gait. Skin: Warm, dry without rashes, lesions, ecchymosis.  Neuro: Cranial nerves intact, reflexes equal bilaterally. Normal muscle tone, no cerebellar symptoms. Sensation intact.  Pysch: Awake and oriented X 3, normal affect, Insight and Judgment appropriate.   Assessment and Plan: Anxiety- do Lexapro 5 or 10mg , and can try adding wellbutrin 150mg  BCP- will try to stay on Safyral Migraines- can try maxalt 10

## 2014-03-21 NOTE — Patient Instructions (Signed)
Migraine Headache A migraine headache is an intense, throbbing pain on one or both sides of your head. A migraine can last for 30 minutes to several hours. CAUSES  The exact cause of a migraine headache is not always known. However, a migraine may be caused when nerves in the brain become irritated and release chemicals that cause inflammation. This causes pain. Certain things may also trigger migraines, such as:  Alcohol.  Smoking.  Stress.  Menstruation.  Aged cheeses.  Foods or drinks that contain nitrates, glutamate, aspartame, or tyramine.  Lack of sleep.  Chocolate.  Caffeine.  Hunger.  Physical exertion.  Fatigue.  Medicines used to treat chest pain (nitroglycerine), birth control pills, estrogen, and some blood pressure medicines. SIGNS AND SYMPTOMS  Pain on one or both sides of your head.  Pulsating or throbbing pain.  Severe pain that prevents daily activities.  Pain that is aggravated by any physical activity.  Nausea, vomiting, or both.  Dizziness.  Pain with exposure to bright lights, loud noises, or activity.  General sensitivity to bright lights, loud noises, or smells. Before you get a migraine, you may get warning signs that a migraine is coming (aura). An aura may include:  Seeing flashing lights.  Seeing bright spots, halos, or zig-zag lines.  Having tunnel vision or blurred vision.  Having feelings of numbness or tingling.  Having trouble talking.  Having muscle weakness. DIAGNOSIS  A migraine headache is often diagnosed based on:  Symptoms.  Physical exam.  A CT scan or MRI of your head. These imaging tests cannot diagnose migraines, but they can help rule out other causes of headaches. TREATMENT Medicines may be given for pain and nausea. Medicines can also be given to help prevent recurrent migraines.  HOME CARE INSTRUCTIONS  Only take over-the-counter or prescription medicines for pain or discomfort as directed by your  health care provider. The use of long-term narcotics is not recommended.  Lie down in a dark, quiet room when you have a migraine.  Keep a journal to find out what may trigger your migraine headaches. For example, write down:  What you eat and drink.  How much sleep you get.  Any change to your diet or medicines.  Limit alcohol consumption.  Quit smoking if you smoke.  Get 7 9 hours of sleep, or as recommended by your health care provider.  Limit stress.  Keep lights dim if bright lights bother you and make your migraines worse. SEEK IMMEDIATE MEDICAL CARE IF:   Your migraine becomes severe.  You have a fever.  You have a stiff neck.  You have vision loss.  You have muscular weakness or loss of muscle control.  You start losing your balance or have trouble walking.  You feel faint or pass out.  You have severe symptoms that are different from your first symptoms. MAKE SURE YOU:   Understand these instructions.  Will watch your condition.  Will get help right away if you are not doing well or get worse. Document Released: 09/22/2005 Document Revised: 07/13/2013 Document Reviewed: 05/30/2013 [employer] Patient Information 2014 [employer].  

## 2014-03-22 ENCOUNTER — Other Ambulatory Visit: Payer: Self-pay | Admitting: Physician Assistant

## 2014-03-22 MED ORDER — LEVONORGEST-ETH ESTRAD 91-DAY 0.15-0.03 MG PO TABS
1.0000 | ORAL_TABLET | Freq: Every day | ORAL | Status: DC
Start: 2014-03-22 — End: 2014-07-07

## 2014-03-28 ENCOUNTER — Encounter: Payer: Self-pay | Admitting: Internal Medicine

## 2014-03-30 ENCOUNTER — Encounter: Payer: Self-pay | Admitting: Internal Medicine

## 2014-03-30 MED ORDER — BACLOFEN 10 MG PO TABS: 10.0000 mg | ORAL_TABLET | Freq: Two times a day (BID) | ORAL | Status: DC

## 2014-04-05 ENCOUNTER — Encounter: Payer: Self-pay | Admitting: Internal Medicine

## 2014-04-05 ENCOUNTER — Ambulatory Visit: Payer: Self-pay | Admitting: Physician Assistant

## 2014-04-20 ENCOUNTER — Encounter: Payer: Self-pay | Admitting: Internal Medicine

## 2014-04-26 ENCOUNTER — Encounter: Payer: Self-pay | Admitting: Internal Medicine

## 2014-05-22 ENCOUNTER — Encounter: Payer: Self-pay | Admitting: Physician Assistant

## 2014-05-22 ENCOUNTER — Ambulatory Visit (INDEPENDENT_AMBULATORY_CARE_PROVIDER_SITE_OTHER): Payer: BC Managed Care – PPO | Admitting: Physician Assistant

## 2014-05-22 VITALS — BP 138/96 | HR 68 | Temp 98.0°F | Resp 18 | Ht 64.0 in | Wt 187.0 lb

## 2014-05-22 DIAGNOSIS — R519 Headache, unspecified: Secondary | ICD-10-CM

## 2014-05-22 DIAGNOSIS — Z79899 Other long term (current) drug therapy: Secondary | ICD-10-CM

## 2014-05-22 DIAGNOSIS — E669 Obesity, unspecified: Secondary | ICD-10-CM

## 2014-05-22 DIAGNOSIS — E8881 Metabolic syndrome: Secondary | ICD-10-CM

## 2014-05-22 DIAGNOSIS — R51 Headache: Secondary | ICD-10-CM

## 2014-05-22 LAB — CBC WITH DIFFERENTIAL/PLATELET
BASOS PCT: 1 % (ref 0–1)
Basophils Absolute: 0.1 10*3/uL (ref 0.0–0.1)
Eosinophils Absolute: 0.1 10*3/uL (ref 0.0–0.7)
Eosinophils Relative: 2 % (ref 0–5)
HCT: 38.3 % (ref 36.0–46.0)
Hemoglobin: 13.1 g/dL (ref 12.0–15.0)
LYMPHS PCT: 35 % (ref 12–46)
Lymphs Abs: 2.4 10*3/uL (ref 0.7–4.0)
MCH: 28.9 pg (ref 26.0–34.0)
MCHC: 34.2 g/dL (ref 30.0–36.0)
MCV: 84.5 fL (ref 78.0–100.0)
Monocytes Absolute: 0.5 10*3/uL (ref 0.1–1.0)
Monocytes Relative: 7 % (ref 3–12)
NEUTROS ABS: 3.7 10*3/uL (ref 1.7–7.7)
Neutrophils Relative %: 55 % (ref 43–77)
PLATELETS: 283 10*3/uL (ref 150–400)
RBC: 4.53 MIL/uL (ref 3.87–5.11)
RDW: 13.4 % (ref 11.5–15.5)
WBC: 6.8 10*3/uL (ref 4.0–10.5)

## 2014-05-22 MED ORDER — NARATRIPTAN HCL 1 MG PO TABS: ORAL_TABLET | ORAL | Status: DC

## 2014-05-22 MED ORDER — HYDROCODONE-ACETAMINOPHEN 5-325 MG PO TABS: 1.0000 | ORAL_TABLET | Freq: Four times a day (QID) | ORAL | Status: DC | PRN

## 2014-05-22 NOTE — Progress Notes (Signed)
Assessment and Plan:  Hypertension- continue to monitor at home, DASH diet given  Headache(784.0)- does not want to try a daily medication at this time, we will try Naratriptan 1mg  BID 2 days prior to menses but if this does not help we are going to try metoprolol daily for anxiety/sleep/migraine prevention  IBS (irritable bowel syndrome)- no diarrhea- doing well  Seasonal allergies- on allegra  Insulin resistance- has fit bit, trying to lose weight.   Anxiety/depression- on lexapro with sexual side effects, switch to pristiq once daily.   Continue diet and meds as discussed. Further disposition pending results of labs.  HPI 28 y.o. female  presents for 3 month follow up with hypertension, hyperlipidemia, prediabetes and vitamin D. Her blood pressure has not been controlled at home, she has a history of gestational HTN, today their BP is BP: 138/96 mmHg She does not workout. She denies chest pain, shortness of breath, dizziness.  She is not on cholesterol medication and denies myalgias. Her cholesterol is at goal. The cholesterol last visit was:   Lab Results  Component Value Date   CHOL 157 10/11/2010   HDL 49 10/11/2010   LDLCALC 81 10/11/2010   TRIG 133 10/11/2010   CHOLHDL 3.2 Ratio 10/11/2010   Last A1C in the office was:  Lab Results  Component Value Date   HGBA1C 5.4 07/03/2009   Patient is on Vitamin D supplement.   Lab Results  Component Value Date   VD25OH 38 10/19/2013     She has a history of menstrual migraines, and is taking an every 3 month BCP that has helped some. This previous time the maxalt  And nasal spray did not help.  She likes the lexapro but states she still has decrease sexual desire with some vaginal dryness.    Current Medications:  Current Outpatient Prescriptions on File Prior to Visit  Medication Sig Dispense Refill  . ALPRAZolam (XANAX) 1 MG tablet Use 1/2-1 pill BID  60 tablet  0  . baclofen (LIORESAL) 10 MG tablet Take 1 tablet (10 mg total) by mouth 2  (two) times daily.  60 tablet  1  . Cholecalciferol (VITAMIN D3) 5000 UNITS TABS Take 5,000 Units by mouth at bedtime.      . clindamycin (CLINDAGEL) 1 % gel Apply 1 application topically 2 (two) times daily.  30 g  2  . escitalopram (LEXAPRO) 10 MG tablet Take 1 tablet (10 mg total) by mouth daily.  30 tablet  2  . Ferrous Sulfate Dried (SLOW IRON PO) Take 1 tablet by mouth as needed.       Marland Kitchen. HYDROcodone-acetaminophen (NORCO) 5-325 MG per tablet Take 1 tablet by mouth every 6 (six) hours as needed for moderate pain.  30 tablet  0  . hydrocortisone (ANUSOL-HC) 2.5 % rectal cream Place 1 application rectally daily as needed for hemorrhoids or itching.      Marland Kitchen. ibuprofen (ADVIL,MOTRIN) 200 MG tablet Take 400-600 mg by mouth every 6 (six) hours as needed for headache.      . levonorgestrel-ethinyl estradiol (JOLESSA) 0.15-0.03 MG tablet Take 1 tablet by mouth daily.  1 Package  4  . Multiple Vitamin (MULTIVITAMIN WITH MINERALS) TABS tablet Take 1 tablet by mouth daily.      . ondansetron (ZOFRAN) 4 MG tablet Take 1 tablet (4 mg total) by mouth daily as needed for nausea or vomiting.  30 tablet  2  . rizatriptan (MAXALT) 10 MG tablet May take 1 PO by mouth at start  of migraine, May repeat in 2 hours if needed  10 tablet  2  . triamcinolone cream (KENALOG) 0.5 % Apply 1 application topically 3 (three) times daily.  80 g  1  . [DISCONTINUED] Drospirenone-Ethinyl Estradiol-Levomefol (SAFYRAL) 3-0.03-0.451 MG tablet Take 1 tablet by mouth daily.  1 Package  3   No current facility-administered medications on file prior to visit.   Medical History:  Past Medical History  Diagnosis Date  . Hypertension   . Headache(784.0)     migraines  . IBS (irritable bowel syndrome)   . Seasonal allergies   . Insulin resistance    Allergies: No Known Allergies   Review of Systems: [X]  = complains of  [ ]  = denies  General: Fatigue [ ]  Fever [ ]  Chills [ ]  Weakness [ ]   Insomnia [ ]  Eyes: Redness [ ]  Blurred  vision [ ]  Diplopia [ ]   ENT: Congestion [ ]  Sinus Pain [ ]  Post Nasal Drip [ ]  Sore Throat [ ]  Earache [ ]   Cardiac: Chest pain/pressure [ ]  SOB [ ]  Orthopnea [ ]   Palpitations [ ]   Paroxysmal nocturnal dyspnea[ ]  Claudication [ ]  Edema [ ]   Pulmonary: Cough [ ]  Wheezing[ ]   SOB [ ]   Snoring [ ]   GI: Nausea [ ]  Vomiting[ ]  Dysphagia[ ]  Heartburn[ ]  Abdominal pain [ ]  Constipation [ ] ; Diarrhea [ ] ; BRBPR [ ]  Melena[ ]  GU: Hematuria[ ]  Dysuria [ ]  Nocturia[ ]  Urgency [ ]   Hesitancy [ ]  Discharge [ ]  Neuro: Headaches[ ]  Vertigo[ ]  Paresthesias[ ]  Spasm [ ]  Speech changes [ ]  Incoordination [ ]   Ortho: Arthritis [ ]  Joint pain [ ]  Muscle pain [ ]  Joint swelling [ ]  Back Pain [ ]  Skin:  Rash [ ]   Pruritis [ ]  Change in skin lesion [ ]   Psych: Depression[ ]  Anxiety[ ]  Confusion [ ]  Memory loss [ ]   Heme/Lypmh: Bleeding [ ]  Bruising [ ]  Enlarged lymph nodes [ ]   Endocrine: Visual blurring [ ]  Paresthesia [ ]  Polyuria [ ]  Polydypsea [ ]    Heat/cold intolerance [ ]  Hypoglycemia [ ]   Family history- Review and unchanged Social history- Review and unchanged Physical Exam: BP 138/96  Pulse 68  Temp(Src) 98 F (36.7 C) (Temporal)  Resp 18  Ht 5\' 4"  (1.626 m)  Wt 187 lb (84.823 kg)  BMI 32.08 kg/m2 Wt Readings from Last 3 Encounters:  05/22/14 187 lb (84.823 kg)  03/21/14 180 lb (81.647 kg)  02/16/14 182 lb (82.555 kg)   General Appearance: Well nourished, in no apparent distress. Eyes: PERRLA, EOMs, conjunctiva no swelling or erythema Sinuses: No Frontal/maxillary tenderness ENT/Mouth: Ext aud canals clear, TMs without erythema, bulging. No erythema, swelling, or exudate on post pharynx.  Tonsils not swollen or erythematous. Hearing normal.  Neck: Supple, thyroid normal.  Respiratory: Respiratory effort normal, BS equal bilaterally without rales, rhonchi, wheezing or stridor.  Cardio: RRR with no MRGs. Brisk peripheral pulses without edema.  Abdomen: Soft, + BS.  Non tender, no guarding,  rebound, hernias, masses. Lymphatics: Non tender without lymphadenopathy.  Musculoskeletal: Full ROM, 5/5 strength, normal gait.  Skin: Warm, dry without rashes, lesions, ecchymosis.  Neuro: Cranial nerves intact. Normal muscle tone, no cerebellar symptoms. Sensation intact.  Psych: Awake and oriented X 3, normal affect, Insight and Judgment appropriate.    Quentin Mulling 11:08 AM

## 2014-05-22 NOTE — Patient Instructions (Signed)
DASH Eating Plan DASH stands for "Dietary Approaches to Stop Hypertension." The DASH eating plan is a healthy eating plan that has been shown to reduce high blood pressure (hypertension). Additional health benefits may include reducing the risk of type 2 diabetes mellitus, heart disease, and stroke. The DASH eating plan may also help with weight loss. WHAT DO I NEED TO KNOW ABOUT THE DASH EATING PLAN? For the DASH eating plan, you will follow these general guidelines:  Choose foods with a percent daily value for sodium of less than 5% (as listed on the food label).  Use salt-free seasonings or herbs instead of table salt or sea salt.  Check with your health care provider or pharmacist before using salt substitutes.  Eat lower-sodium products, often labeled as "lower sodium" or "no salt added."  Eat fresh foods.  Eat more vegetables, fruits, and low-fat dairy products.  Choose whole grains. Look for the word "whole" as the first word in the ingredient list.  Choose fish and skinless chicken or turkey more often than red meat. Limit fish, poultry, and meat to 6 oz (170 g) each day.  Limit sweets, desserts, sugars, and sugary drinks.  Choose heart-healthy fats.  Limit cheese to 1 oz (28 g) per day.  Eat more home-cooked food and less restaurant, buffet, and fast food.  Limit fried foods.  Cook foods using methods other than frying.  Limit canned vegetables. If you do use them, rinse them well to decrease the sodium.  When eating at a restaurant, ask that your food be prepared with less salt, or no salt if possible. WHAT FOODS CAN I EAT? Seek help from a dietitian for individual calorie needs. Grains Whole grain or whole wheat bread. Brown rice. Whole grain or whole wheat pasta. Quinoa, bulgur, and whole grain cereals. Low-sodium cereals. Corn or whole wheat flour tortillas. Whole grain cornbread. Whole grain crackers. Low-sodium crackers. Vegetables Fresh or frozen vegetables  (raw, steamed, roasted, or grilled). Low-sodium or reduced-sodium tomato and vegetable juices. Low-sodium or reduced-sodium tomato sauce and paste. Low-sodium or reduced-sodium canned vegetables.  Fruits All fresh, canned (in natural juice), or frozen fruits. Meat and Other Protein Products Ground beef (85% or leaner), grass-fed beef, or beef trimmed of fat. Skinless chicken or turkey. Ground chicken or turkey. Pork trimmed of fat. All fish and seafood. Eggs. Dried beans, peas, or lentils. Unsalted nuts and seeds. Unsalted canned beans. Dairy Low-fat dairy products, such as skim or 1% milk, 2% or reduced-fat cheeses, low-fat ricotta or cottage cheese, or plain low-fat yogurt. Low-sodium or reduced-sodium cheeses. Fats and Oils Tub margarines without trans fats. Light or reduced-fat mayonnaise and salad dressings (reduced sodium). Avocado. Safflower, olive, or canola oils. Natural peanut or almond butter. Other Unsalted popcorn and pretzels. The items listed above may not be a complete list of recommended foods or beverages. Contact your dietitian for more options. WHAT FOODS ARE NOT RECOMMENDED? Grains White bread. White pasta. White rice. Refined cornbread. Bagels and croissants. Crackers that contain trans fat. Vegetables Creamed or fried vegetables. Vegetables in a cheese sauce. Regular canned vegetables. Regular canned tomato sauce and paste. Regular tomato and vegetable juices. Fruits Dried fruits. Canned fruit in light or heavy syrup. Fruit juice. Meat and Other Protein Products Fatty cuts of meat. Ribs, chicken wings, bacon, sausage, bologna, salami, chitterlings, fatback, hot dogs, bratwurst, and packaged luncheon meats. Salted nuts and seeds. Canned beans with salt. Dairy Whole or 2% milk, cream, half-and-half, and cream cheese. Whole-fat or sweetened yogurt. Full-fat   cheeses or blue cheese. Nondairy creamers and whipped toppings. Processed cheese, cheese spreads, or cheese  curds. Condiments Onion and garlic salt, seasoned salt, table salt, and sea salt. Canned and packaged gravies. Worcestershire sauce. Tartar sauce. Barbecue sauce. Teriyaki sauce. Soy sauce, including reduced sodium. Steak sauce. Fish sauce. Oyster sauce. Cocktail sauce. Horseradish. Ketchup and mustard. Meat flavorings and tenderizers. Bouillon cubes. Hot sauce. Tabasco sauce. Marinades. Taco seasonings. Relishes. Fats and Oils Butter, stick margarine, lard, shortening, ghee, and bacon fat. Coconut, palm kernel, or palm oils. Regular salad dressings. Other Pickles and olives. Salted popcorn and pretzels. The items listed above may not be a complete list of foods and beverages to avoid. Contact your dietitian for more information. WHERE CAN I FIND MORE INFORMATION? National Heart, Lung, and Blood Institute: CablePromo.itwww.nhlbi.nih.gov/health/health-topics/topics/dash/ Document Released: 09/11/2011 Document Revised: 02/06/2014 Document Reviewed: 07/27/2013 [facility] Patient Information 2015 [place], [place]. This information is not intended to replace advice given to you by your health care provider. Make sure you discuss any questions you have with your health care provider. Migraine Headache A migraine headache is an intense, throbbing pain on one or both sides of your head. A migraine can last for 30 minutes to several hours. CAUSES  The exact cause of a migraine headache is not always known. However, a migraine may be caused when nerves in the brain become irritated and release chemicals that cause inflammation. This causes pain. Certain things may also trigger migraines, such as:  Alcohol.  Smoking.  Stress.  Menstruation.  Aged cheeses.  Foods or drinks that contain nitrates, glutamate, aspartame, or tyramine.  Lack of sleep.  Chocolate.  Caffeine.  Hunger.  Physical exertion.  Fatigue.  Medicines used to treat chest pain (nitroglycerine), birth control pills, estrogen, and some  blood pressure medicines. SIGNS AND SYMPTOMS  Pain on one or both sides of your head.  Pulsating or throbbing pain.  Severe pain that prevents daily activities.  Pain that is aggravated by any physical activity.  Nausea, vomiting, or both.  Dizziness.  Pain with exposure to bright lights, loud noises, or activity.  General sensitivity to bright lights, loud noises, or smells. Before you get a migraine, you may get warning signs that a migraine is coming (aura). An aura may include:  Seeing flashing lights.  Seeing bright spots, halos, or zigzag lines.  Having tunnel vision or blurred vision.  Having feelings of numbness or tingling.  Having trouble talking.  Having muscle weakness. DIAGNOSIS  A migraine headache is often diagnosed based on:  Symptoms.  Physical exam.  A CT scan or MRI of your head. These imaging tests cannot diagnose migraines, but they can help rule out other causes of headaches. TREATMENT Medicines may be given for pain and nausea. Medicines can also be given to help prevent recurrent migraines.  HOME CARE INSTRUCTIONS  Only take over-the-counter or prescription medicines for pain or discomfort as directed by your health care provider. The use of long-term narcotics is not recommended.  Lie down in a dark, quiet room when you have a migraine.  Keep a journal to find out what may trigger your migraine headaches. For example, write down:  What you eat and drink.  How much sleep you get.  Any change to your diet or medicines.  Limit alcohol consumption.  Quit smoking if you smoke.  Get 7-9 hours of sleep, or as recommended by your health care provider.  Limit stress.  Keep lights dim if bright lights bother you and make your  migraines worse. SEEK IMMEDIATE MEDICAL CARE IF:   Your migraine becomes severe.  You have a fever.  You have a stiff neck.  You have vision loss.  You have muscular weakness or loss of muscle  control.  You start losing your balance or have trouble walking.  You feel faint or pass out.  You have severe symptoms that are different from your first symptoms. MAKE SURE YOU:   Understand these instructions.  Will watch your condition.  Will get help right away if you are not doing well or get worse. Document Released: 09/22/2005 Document Revised: 02/06/2014 Document Reviewed: 05/30/2013 [facility] Patient Information 2015 [place], [place]. This information is not intended to replace advice given to you by your health care provider. Make sure you discuss any questions you have with your health care provider.

## 2014-05-23 LAB — BASIC METABOLIC PANEL WITH GFR
BUN: 14 mg/dL (ref 6–23)
CALCIUM: 8.9 mg/dL (ref 8.4–10.5)
CHLORIDE: 104 meq/L (ref 96–112)
CO2: 24 mEq/L (ref 19–32)
CREATININE: 0.81 mg/dL (ref 0.50–1.10)
Glucose, Bld: 60 mg/dL — ABNORMAL LOW (ref 70–99)
Potassium: 4.2 mEq/L (ref 3.5–5.3)
Sodium: 137 mEq/L (ref 135–145)

## 2014-05-23 LAB — HEPATIC FUNCTION PANEL
ALBUMIN: 3.8 g/dL (ref 3.5–5.2)
ALK PHOS: 65 U/L (ref 39–117)
ALT: 16 U/L (ref 0–35)
AST: 17 U/L (ref 0–37)
BILIRUBIN TOTAL: 0.5 mg/dL (ref 0.2–1.2)
Bilirubin, Direct: 0.1 mg/dL (ref 0.0–0.3)
Indirect Bilirubin: 0.4 mg/dL (ref 0.2–1.2)
TOTAL PROTEIN: 7.1 g/dL (ref 6.0–8.3)

## 2014-05-23 LAB — INSULIN, FASTING: Insulin fasting, serum: 46 u[IU]/mL — ABNORMAL HIGH (ref 3–28)

## 2014-05-23 LAB — TSH: TSH: 1.188 u[IU]/mL (ref 0.350–4.500)

## 2014-05-23 LAB — MAGNESIUM: MAGNESIUM: 1.9 mg/dL (ref 1.5–2.5)

## 2014-05-30 ENCOUNTER — Encounter: Payer: Self-pay | Admitting: Internal Medicine

## 2014-05-31 MED ORDER — LEVONORGESTREL-ETHINYL ESTRAD 0.15-30 MG-MCG PO TABS: 1.0000 | ORAL_TABLET | Freq: Every day | ORAL | Status: DC

## 2014-06-02 ENCOUNTER — Encounter: Payer: Self-pay | Admitting: Internal Medicine

## 2014-06-07 ENCOUNTER — Encounter: Payer: Self-pay | Admitting: Internal Medicine

## 2014-06-08 ENCOUNTER — Encounter: Payer: Self-pay | Admitting: Internal Medicine

## 2014-06-08 MED ORDER — ALPRAZOLAM 1 MG PO TABS: ORAL_TABLET | ORAL | Status: DC

## 2014-06-08 MED ORDER — LEVONORGESTREL-ETHINYL ESTRAD 0.15-30 MG-MCG PO TABS: 1.0000 | ORAL_TABLET | Freq: Every day | ORAL | Status: DC

## 2014-06-09 ENCOUNTER — Encounter: Payer: Self-pay | Admitting: Internal Medicine

## 2014-06-13 ENCOUNTER — Encounter: Payer: Self-pay | Admitting: Physician Assistant

## 2014-06-20 ENCOUNTER — Other Ambulatory Visit: Payer: Self-pay

## 2014-06-20 ENCOUNTER — Encounter: Payer: Self-pay | Admitting: Internal Medicine

## 2014-06-20 MED ORDER — DESVENLAFAXINE SUCCINATE ER 50 MG PO TB24: 50.0000 mg | ORAL_TABLET | Freq: Every day | ORAL | Status: DC

## 2014-06-21 ENCOUNTER — Other Ambulatory Visit: Payer: Self-pay | Admitting: Physician Assistant

## 2014-07-07 ENCOUNTER — Encounter: Payer: Self-pay | Admitting: Physician Assistant

## 2014-07-07 ENCOUNTER — Ambulatory Visit (INDEPENDENT_AMBULATORY_CARE_PROVIDER_SITE_OTHER): Payer: BC Managed Care – PPO | Admitting: Physician Assistant

## 2014-07-07 VITALS — BP 136/98 | HR 96 | Temp 98.0°F | Resp 18 | Ht 64.0 in | Wt 188.0 lb

## 2014-07-07 DIAGNOSIS — R519 Headache, unspecified: Secondary | ICD-10-CM

## 2014-07-07 DIAGNOSIS — R51 Headache: Secondary | ICD-10-CM

## 2014-07-07 DIAGNOSIS — F411 Generalized anxiety disorder: Secondary | ICD-10-CM

## 2014-07-07 MED ORDER — ATENOLOL 50 MG PO TABS: 50.0000 mg | ORAL_TABLET | Freq: Every day | ORAL | Status: DC

## 2014-07-07 MED ORDER — FLUVOXAMINE MALEATE 50 MG PO TABS: 50.0000 mg | ORAL_TABLET | Freq: Every day | ORAL | Status: DC

## 2014-07-07 MED ORDER — OXYCODONE-ACETAMINOPHEN 5-325 MG PO TABS: 1.0000 | ORAL_TABLET | Freq: Four times a day (QID) | ORAL | Status: DC | PRN

## 2014-07-07 NOTE — Patient Instructions (Signed)
Generalized Anxiety Disorder Generalized anxiety disorder (GAD) is a mental disorder. It interferes with life functions, including relationships, work, and school. GAD is different from normal anxiety, which everyone experiences at some point in their lives in response to specific life events and activities. Normal anxiety actually helps us prepare for and get through these life events and activities. Normal anxiety goes away after the event or activity is over.  GAD causes anxiety that is not necessarily related to specific events or activities. It also causes excess anxiety in proportion to specific events or activities. The anxiety associated with GAD is also difficult to control. GAD can vary from mild to severe. People with severe GAD can have intense waves of anxiety with physical symptoms (panic attacks).  SYMPTOMS The anxiety and worry associated with GAD are difficult to control. This anxiety and worry are related to many life events and activities and also occur more days than not for 6 months or longer. People with GAD also have three or more of the following symptoms (one or more in children):  Restlessness.   Fatigue.  Difficulty concentrating.   Irritability.  Muscle tension.  Difficulty sleeping or unsatisfying sleep. DIAGNOSIS GAD is diagnosed through an assessment by your health care provider. Your health care provider will ask you questions aboutyour mood,physical symptoms, and events in your life. Your health care provider may ask you about your medical history and use of alcohol or drugs, including prescription medicines. Your health care provider may also do a physical exam and blood tests. Certain medical conditions and the use of certain substances can cause symptoms similar to those associated with GAD. Your health care provider may refer you to a mental health specialist for further evaluation. TREATMENT The following therapies are usually used to treat GAD:    Medication. Antidepressant medication usually is prescribed for long-term daily control. Antianxiety medicines may be added in severe cases, especially when panic attacks occur.   Talk therapy (psychotherapy). Certain types of talk therapy can be helpful in treating GAD by providing support, education, and guidance. A form of talk therapy called cognitive behavioral therapy can teach you healthy ways to think about and react to daily life events and activities.  Stress managementtechniques. These include yoga, meditation, and exercise and can be very helpful when they are practiced regularly. A mental health specialist can help determine which treatment is best for you. Some people see improvement with one therapy. However, other people require a combination of therapies. Document Released: 01/17/2013 Document Revised: 02/06/2014 Document Reviewed: 01/17/2013 [employer] Patient Information 2015 [employer]. This information is not intended to replace advice given to you by your health care provider. Make sure you discuss any questions you have with your health care provider.  

## 2014-07-07 NOTE — Progress Notes (Signed)
HPI 28 y.o.female presents for folllow up. She states that she was started on an estrogen low dose from her OB/GYN for spotting. She still had a migraine with her menses and took a norco which caused nausea, states she did not have nausea with. In the past week she has been taking excedrin tension 2 every other day, and she takes baclofen occ. Her BP has been elevated at home, running 140-150's over 90, she has had constant dull headache, shoulder tension. She was put on pristiq for anxiety/depression because other medications caused decreased sex drive, she states her sex drive is improved but she feels it does not help with her OCD tendencies and anxiety/snappiness. Lexapro helped and wellbutrin did not help.   Past Medical History  Diagnosis Date  . Hypertension   . Headache(784.0)     migraines  . IBS (irritable bowel syndrome)   . Seasonal allergies   . Insulin resistance   . Anxiety   . Depression      No Known Allergies    Current Outpatient Prescriptions on File Prior to Visit  Medication Sig Dispense Refill  . ALPRAZolam (XANAX) 1 MG tablet Use 1/2-1 pill BID  60 tablet  0  . Cholecalciferol (VITAMIN D3) 5000 UNITS TABS Take 5,000 Units by mouth at bedtime.      . clindamycin (CLINDAGEL) 1 % gel APPLY TWICE DAILY  30 g  0  . desvenlafaxine (PRISTIQ) 50 MG 24 hr tablet Take 1 tablet (50 mg total) by mouth daily.  30 tablet  1  . hydrocortisone (ANUSOL-HC) 2.5 % rectal cream Place 1 application rectally daily as needed for hemorrhoids or itching.      Marland Kitchen. ibuprofen (ADVIL,MOTRIN) 200 MG tablet Take 400-600 mg by mouth every 6 (six) hours as needed for headache.      . naratriptan (AMERGE) 1 MG TABS tablet Take one pill twice daily for 2 days before menstrual cycle  12 tablet  3  . ondansetron (ZOFRAN) 4 MG tablet Take 1 tablet (4 mg total) by mouth daily as needed for nausea or vomiting.  30 tablet  2  . HYDROcodone-acetaminophen (NORCO) 5-325 MG per tablet Take 1 tablet by mouth  every 6 (six) hours as needed for moderate pain.  30 tablet  0  . rizatriptan (MAXALT) 10 MG tablet May take 1 PO by mouth at start of migraine, May repeat in 2 hours if needed  10 tablet  2  . [DISCONTINUED] Drospirenone-Ethinyl Estradiol-Levomefol (SAFYRAL) 3-0.03-0.451 MG tablet Take 1 tablet by mouth daily.  1 Package  3   No current facility-administered medications on file prior to visit.    ROS: all negative expect above.   Physical: Filed Weights   07/07/14 1000  Weight: 188 lb (85.276 kg)   BP 136/98  Pulse 96  Temp(Src) 98 F (36.7 C) (Temporal)  Resp 18  Ht 5\' 4"  (1.626 m)  Wt 188 lb (85.276 kg)  BMI 32.25 kg/m2  LMP 06/26/2014 General Appearance: Well nourished, in no apparent distress. Eyes: PERRLA, EOMs. Sinuses: No Frontal/maxillary tenderness ENT/Mouth: Ext aud canals clear, normal light reflex with TMs without erythema, bulging. Post pharynx without erythema, swelling, exudate.  Respiratory: CTAB Cardio: RRR, no murmurs, rubs or gallops. Peripheral pulses brisk and equal bilaterally, without edema. No aortic or femoral bruits. Abdomen: Soft, with bowl sounds. Nontender, no guarding, rebound. Lymphatics: Non tender without lymphadenopathy.  Musculoskeletal: Full ROM all peripheral extremities, 5/5 strength, and normal gait. Skin: Warm, dry without rashes, lesions, ecchymosis.  Neuro: Cranial nerves intact, reflexes equal bilaterally. Normal muscle tone, no cerebellar symptoms. Sensation intact.  Pysch: Awake and oriented X 3, normal affect, Insight and Judgment appropriate.   Assessment and Plan: Migraines/elevated BP- atenolol 50mg  QHS Anxiety/OCD- fluvoxamine or brintillix- will try generic first and then go to brintillix.   Follow up 1 month OV

## 2014-07-26 ENCOUNTER — Encounter: Payer: Self-pay | Admitting: Physician Assistant

## 2014-07-26 ENCOUNTER — Ambulatory Visit (INDEPENDENT_AMBULATORY_CARE_PROVIDER_SITE_OTHER): Payer: BC Managed Care – PPO | Admitting: Physician Assistant

## 2014-07-26 VITALS — BP 110/68 | HR 76 | Temp 98.5°F | Resp 16 | Ht 64.0 in | Wt 189.0 lb

## 2014-07-26 DIAGNOSIS — N6310 Unspecified lump in the right breast, unspecified quadrant: Secondary | ICD-10-CM

## 2014-07-26 DIAGNOSIS — R519 Headache, unspecified: Secondary | ICD-10-CM

## 2014-07-26 DIAGNOSIS — M2660 Temporomandibular joint disorder, unspecified: Secondary | ICD-10-CM

## 2014-07-26 DIAGNOSIS — M26609 Unspecified temporomandibular joint disorder, unspecified side: Secondary | ICD-10-CM

## 2014-07-26 DIAGNOSIS — F411 Generalized anxiety disorder: Secondary | ICD-10-CM

## 2014-07-26 DIAGNOSIS — N63 Unspecified lump in breast: Secondary | ICD-10-CM

## 2014-07-26 DIAGNOSIS — R51 Headache: Secondary | ICD-10-CM

## 2014-07-26 MED ORDER — DIAZEPAM 2 MG PO TABS
ORAL_TABLET | ORAL | Status: DC
Start: 2014-07-26 — End: 2014-09-04

## 2014-07-26 NOTE — Progress Notes (Signed)
   Subjective:    Patient ID: Eileen Rios, female    DOB: 05-31-1986, 28 y.o.   MRN: [id number]  HPI 28 y.o.  Female with history of migraines presents with headache and right breast tenderness.   She states the atenolol is helping her headaches some and the she is tolerating the Luvox, however she is having tension at her right neck with some stiffness. She denies changes in speech, vision, weakness, dizziness. She also has had a tender area under her right arm near her breast, no warmth, nipple discharge.   Review of Systems  Constitutional: Negative.  Negative for chills and fatigue.  Respiratory: Negative.   Cardiovascular: Negative.   Gastrointestinal: Negative.   Genitourinary: Negative.   Musculoskeletal: Positive for neck pain and neck stiffness. Negative for arthralgias, back pain, gait problem, joint swelling and myalgias.  Neurological: Positive for headaches. Negative for dizziness, tremors, seizures, syncope, facial asymmetry, speech difficulty, weakness, light-headedness and numbness.       Objective:   Physical Exam  Constitutional: She is oriented to person, place, and time. She appears well-developed and well-nourished. No distress.  HENT:  Head: Normocephalic and atraumatic.  Right Ear: External ear normal.  Left Ear: External ear normal.  + TMJ tenderness and SCM tenderness bilaterally, right greater than left  Eyes: Conjunctivae are normal. Pupils are equal, round, and reactive to light.  Neck: Normal range of motion. Neck supple.  Pulmonary/Chest: Right breast exhibits mass and tenderness. Right breast exhibits no inverted nipple and no nipple discharge.    Lymphadenopathy:    She has no cervical adenopathy.  Neurological: She is alert and oriented to person, place, and time. No cranial nerve deficit. Coordination normal.       Assessment & Plan:  Right breast tenderness- some erythema, mobile tender area- ? Cyst versus abscess- warm wet compresses,  decrease salt,coffee, sugars. Follow up if not better.  Headaches- continue atenolol and + TMJ tenderness- information given to the patient, no gum/decrease hard foods, warm wet wash clothes, decrease stress, talk with dentist about possible night guard, can do massage, and exercise. Will stop the xanax and try valium as a muscle relaxer/anti anxiety. if worsening HA, changes vision/speech, imbalance, weakness go to the ER

## 2014-07-26 NOTE — Patient Instructions (Signed)
What is the TMJ? The temporomandibular (tem-PUH-ro-man-DIB-yoo-ler) joint, or the TMJ, connects the upper and lower jawbones. This joint allows the jaw to open wide and move back and forth when you chew, talk, or yawn.There are also several muscles that help this joint move. There can be muscle tightness and pain in the muscle that can cause several symptoms.  What causes TMJ pain? There are many causes of TMJ pain. Repeated chewing (for example, chewing gum) and clenching your teeth can cause pain in the joint. Some TMJ pain has no obvious cause. What can I do to ease the pain? There are many things you can do to help your pain get better. When you have pain:  Eat soft foods and stay away from chewy foods (for example, taffy) Try to use both sides of your mouth to chew Don't chew gum Don't open your mouth wide (for example, during yawning or singing) Don't bite your cheeks or fingernails Lower your amount of stress and worry Applying a warm, damp washcloth to the joint may help. Over-the-counter pain medicines such as ibuprofen (one brand: Advil) or acetaminophen (one brand: Tylenol) might also help. Do not use these medicines if you are allergic to them or if your doctor told you not to use them. How can I stop the pain from coming back? When your pain is better, you can do these exercises to make your muscles stronger and to keep the pain from coming back:  Resisted mouth opening: Place your thumb or two fingers under your chin and open your mouth slowly, pushing up lightly on your chin with your thumb. Hold for three to six seconds. Close your mouth slowly. Resisted mouth closing: Place your thumbs under your chin and your two index fingers on the ridge between your mouth and the bottom of your chin. Push down lightly on your chin as you close your mouth. Tongue up: Slowly open and close your mouth while keeping the tongue touching the roof of the mouth. Side-to-side jaw movement: Place an  object about one fourth of an inch thick (for example, two tongue depressors) between your front teeth. Slowly move your jaw from side to side. Increase the thickness of the object as the exercise becomes easier Forward jaw movement: Place an object about one fourth of an inch thick between your front teeth and move the bottom jaw forward so that the bottom teeth are in front of the top teeth. Increase the thickness of the object as the exercise becomes easier. These exercises should not be painful. If it hurts to do these exercises, stop doing them and talk to your family doctor.   Fibrocystic Breast Changes Fibrocystic breast changes occur when breast ducts become blocked, causing painful, fluid-filled lumps (cysts) to form in the breast. This is a common condition that is noncancerous (benign). It occurs when women go through hormonal changes during their menstrual cycle. Fibrocystic breast changes can affect one or both breasts. CAUSES  The exact cause of fibrocystic breast changes is not known, but it may be related to the female hormones estrogen and progesterone. Family traits that get passed from parent to child (genetics) may also be a factor in some cases. SIGNS AND SYMPTOMS   Tenderness, mild discomfort, or pain.   Swelling.   Ropelike feeling when touching the breast.   Lumpy breast, one or both sides.   Changes in breast size, especially before (larger) and after (smaller) the menstrual period.   Green or dark brown nipple discharge (  not blood).  Symptoms are usually worse before menstrual periods start and get better toward the end of the menstrual period.  DIAGNOSIS  To make a diagnosis, your health care provider will ask you questions and perform a physical exam of your breasts. The health care provider may recommend other tests that can examine inside your breasts, such as:  A breast X-ray (mammogram).   Ultrasonography.  An MRI.  If something more than  fibrocystic breast changes is suspected, your health care provider may take a breast tissue sample (breast biopsy) to examine. TREATMENT  Often, treatment is not needed. Your health care provider may recommend over-the-counter pain relievers to help lessen pain or discomfort caused by the fibrocystic breast changes. You may also be asked to change your diet to limit or stop eating foods or drinking beverages that contain caffeine. Foods and beverages that contain caffeine include chocolate, soda, coffee, and tea. Reducing sugar and fat in your diet may also help. Your health care provider may also recommend:  Fine needle aspiration to remove fluid from a cyst that is causing pain.   Surgery to remove a large, persistent, and tender cyst. HOME CARE INSTRUCTIONS   Examine your breasts after every menstrual period. If you do not have menstrual periods, check your breasts the first day of every month. Feel for changes, such as more tenderness, a new growth, a change in breast size, or a change in a lump that has always been there.   Only take over-the-counter or prescription medicine as directed by your health care provider.   Wear a well-fitted support or sports bra, especially when exercising.   Decrease or avoid caffeine, fat, and sugar in your diet as directed by your health care provider.  SEEK MEDICAL CARE IF:   You have fluid leaking (discharge) from your nipples, especially bloody discharge.   You have new lumps or bumps in the breast.   Your breast or breasts become enlarged, red, and painful.   You have areas of your breast that pucker in.   Your nipples appear flat or indented.  Document Released: 07/09/2006 Document Revised: 09/27/2013 Document Reviewed: 03/13/2013 [facility] Patient Information 2015 [place]Care, [place]. This information is not intended to replace advice given to you by your health care provider. Make sure you discuss any questions you have with your health  care provider.

## 2014-08-01 ENCOUNTER — Other Ambulatory Visit: Payer: Self-pay | Admitting: Physician Assistant

## 2014-08-07 ENCOUNTER — Encounter: Payer: Self-pay | Admitting: Physician Assistant

## 2014-08-10 ENCOUNTER — Ambulatory Visit: Payer: Self-pay | Admitting: Physician Assistant

## 2014-08-11 ENCOUNTER — Other Ambulatory Visit: Payer: Self-pay | Admitting: Physician Assistant

## 2014-08-14 ENCOUNTER — Encounter: Payer: Self-pay | Admitting: Internal Medicine

## 2014-08-14 ENCOUNTER — Other Ambulatory Visit: Payer: Self-pay | Admitting: Internal Medicine

## 2014-08-14 MED ORDER — MOMETASONE FUROATE 50 MCG/ACT NA SUSP: 2.0000 | Freq: Every day | NASAL | Status: DC

## 2014-08-14 MED ORDER — FLUTICASONE PROPIONATE 50 MCG/ACT NA SUSP: NASAL | Status: DC

## 2014-09-04 ENCOUNTER — Other Ambulatory Visit: Payer: Self-pay

## 2014-09-04 ENCOUNTER — Other Ambulatory Visit: Payer: Self-pay | Admitting: Physician Assistant

## 2014-09-04 MED ORDER — CLINDAMYCIN PHOSPHATE 1 % EX GEL
Freq: Two times a day (BID) | CUTANEOUS | Status: DC
Start: 2014-09-04 — End: 2015-02-13

## 2014-09-18 ENCOUNTER — Encounter: Payer: Self-pay | Admitting: Physician Assistant

## 2014-09-18 ENCOUNTER — Ambulatory Visit (INDEPENDENT_AMBULATORY_CARE_PROVIDER_SITE_OTHER): Payer: BC Managed Care – PPO | Admitting: Physician Assistant

## 2014-09-18 VITALS — BP 126/90 | HR 76 | Temp 98.4°F | Resp 16 | Ht 64.0 in | Wt 194.0 lb

## 2014-09-18 DIAGNOSIS — G5602 Carpal tunnel syndrome, left upper limb: Secondary | ICD-10-CM

## 2014-09-18 MED ORDER — PREDNISONE 20 MG PO TABS: ORAL_TABLET | ORAL | Status: AC

## 2014-09-18 NOTE — Patient Instructions (Addendum)
-Take Prednisone as prescribed for inflammation. -Take tylenol or Ibuprofen for pain- follow directions on bottle  -Mardella LaymanLindsey will call you with referal appt date and time.  Please follow up in one month  Carpal Tunnel Syndrome Carpal tunnel syndrome is a disorder of the nervous system in the wrist that causes pain, hand weakness, and/or loss of feeling. Carpal tunnel syndrome is caused by the compression, stretching, or irritation of the median nerve at the wrist joint. Athletes who experience carpal tunnel syndrome may notice a decrease in their performance to the condition, especially for sports that require strong hand or wrist action.  SYMPTOMS   Tingling, numbness, or burning pain in the hand or fingers.  Inability to sleep due to pain in the hand.  Sharp pains that shoot from the wrist up the arm or to the fingers, especially at night.  Morning stiffness or cramping of the hand.  Thumb weakness, resulting in difficulty holding objects or making a fist.  Shiny, dry skin on the hand.  Reduced performance in any sport requiring a strong grip. CAUSES   Median nerve damage at the wrist is caused by pressure due to swelling, inflammation, or scarred tissue.  Sources of pressure include:  Repetitive gripping or squeezing that causes inflammation of the tendon sheaths.  Scarring or shortening of the ligament that covers the median nerve.  Traumatic injury to the wrist or forearm such as fracture, sprain, or dislocation.  Prolonged hyperextension (wrist bent backward) or hyperflexion (wrist bent downward) of the wrist. RISK INCREASES WITH:  Diabetes mellitus.  Menopause or amenorrhea.  Rheumatoid arthritis.  Raynaud disease.  Pregnancy.  Gout.  Kidney disease.  Ganglion cyst.  Repetitive hand or wrist action.  Hypothyroidism (underactive thyroid gland).  Repetitive jolting or shaking of the hands or wrist.  Prolonged forceful weight-bearing on the  hands. PREVENTION  Bracing the hand and wrist straight during activities that involve repetitive grasping.  For activities that require prolonged extension of the wrist (bending towards the top of the forearm) periodically change the position of your wrists.  Learn and use proper technique in activities that result in the wrist position in neutral to slight extension.  Avoid bending the wrist into full extension or flexion (up or down).  Keep the wrist in a straight (neutral) position. To keep the wrist in this position, wear a splint.  Avoid repetitive hand and wrist motions.  When possible avoid prolonged grasping of items (steering wheel of a car, a pen, a vacuum cleaner, or a rake).  Loosen your grip for activities that require prolonged grasping of items.  Place keyboards and writing surfaces at the correct height as to decrease strain on the wrist and hand.  Alternate work tasks to avoid prolonged wrist flexion.  Avoid pinching activities (needlework and writing) as they may irritate your carpal tunnel syndrome.  If these activities are necessary, complete them for shorter periods of time.  When writing, use a felt tip or rollerball pen and/or build up the grip on a pen to decrease the forces required for writing. PROGNOSIS  Carpal tunnel syndrome is usually curable with appropriate conservative treatment and sometimes resolves spontaneously. For some cases, surgery is necessary, especially if muscle wasting or nerve changes have developed.  RELATED COMPLICATIONS   Permanent numbness and a weak thumb or fingers in the affected hand.  Permanent paralysis of a portion of the hand and fingers. TREATMENT  Treatment initially consists of stopping activities that aggravate the symptoms as well as  medication and ice to reduce inflammation. A wrist splint is often recommended for wear during activities of repetitive motion as well as at night. It is also important to learn and use  proper technique when performing activities that typically cause pain. On occasion, a corticosteroid injection may be given. If symptoms persist despite conservative treatment, surgery may be an option. Surgical techniques free the pinched or compressed nerve. Carpal tunnel surgery is usually performed on an outpatient basis, meaning you go home the same day as surgery. These procedures provide almost complete relief of all symptoms in 95% of patients. Expect at least 2 weeks for healing after surgery. For cases that are the result of repeated jolting or shaking of the hand or wrist or prolonged hyperextension, surgery is not usually recommended because stretching of the median nerve, not compression, is usually the cause of carpal tunnel syndrome in these cases. MEDICATION   If pain medication is necessary, nonsteroidal anti-inflammatory medications, such as aspirin and ibuprofen, or other minor pain relievers, such as acetaminophen, are often recommended.  Do not take pain medication for 7 days before surgery.  Prescription pain relievers are usually only prescribed after surgery. Use only as directed and only as much as you need.  Corticosteroid injections may be given to reduce inflammation. However, they are not always recommended.  Vitamin B6 (pyridoxine) may reduce symptoms; use only if prescribed for your disorder. SEEK MEDICAL CARE IF:   Symptoms get worse or do not improve in 2 weeks despite treatment.  You also have a current or recent history of neck or shoulder injury that has resulted in pain or tingling elsewhere in your arm. Document Released: 09/22/2005 Document Revised: 02/06/2014 Document Reviewed: 01/04/2009 [facility] Patient Information 2015 [employer] [place]. This information is not intended to replace advice given to you by your health care provider. Make sure you discuss any questions you have with your health care provider.  What is the TMJ? The temporomandibular  (tem-PUH-ro-man-DIB-yoo-ler) joint, or the TMJ, connects the upper and lower jawbones. This joint allows the jaw to open wide and move back and forth when you chew, talk, or yawn.There are also several muscles that help this joint move. There can be muscle tightness and pain in the muscle that can cause several symptoms.  What causes TMJ pain? There are many causes of TMJ pain. Repeated chewing (for example, chewing gum) and clenching your teeth can cause pain in the joint. Some TMJ pain has no obvious cause. What can I do to ease the pain? There are many things you can do to help your pain get better. When you have pain:  Eat soft foods and stay away from chewy foods (for example, taffy) Try to use both sides of your mouth to chew Don't chew gum Massage Don't open your mouth wide (for example, during yawning or singing) Don't bite your cheeks or fingernails Lower your amount of stress and worry Applying a warm, damp washcloth to the joint may help. Over-the-counter pain medicines such as ibuprofen (one brand: Advil) or acetaminophen (one brand: Tylenol) might also help. Do not use these medicines if you are allergic to them or if your doctor told you not to use them. How can I stop the pain from coming back? When your pain is better, you can do these exercises to make your muscles stronger and to keep the pain from coming back:  Resisted mouth opening: Place your thumb or two fingers under your chin and open your mouth slowly, pushing  up lightly on your chin with your thumb. Hold for three to six seconds. Close your mouth slowly. Resisted mouth closing: Place your thumbs under your chin and your two index fingers on the ridge between your mouth and the bottom of your chin. Push down lightly on your chin as you close your mouth. Tongue up: Slowly open and close your mouth while keeping the tongue touching the roof of the mouth. Side-to-side jaw movement: Place an object about one fourth of an  inch thick (for example, two tongue depressors) between your front teeth. Slowly move your jaw from side to side. Increase the thickness of the object as the exercise becomes easier Forward jaw movement: Place an object about one fourth of an inch thick between your front teeth and move the bottom jaw forward so that the bottom teeth are in front of the top teeth. Increase the thickness of the object as the exercise becomes easier. These exercises should not be painful. If it hurts to do these exercises, stop doing them and talk to your family doctor.

## 2014-09-18 NOTE — Progress Notes (Signed)
Subjective:    Patient ID: Eileen Rios, female    DOB: [birth year], 28 y.o.   MRN: [id number]  Wrist Pain  The pain is present in the left wrist. This is a new problem. Episode onset: Noticed sxs last Saturday. There has been a history of trauma (Pateint fell on ice years ago and had injury to left wrist.  No recent injury.). The problem occurs intermittently. The problem has been waxing and waning. The quality of the pain is described as dull and aching. The pain is at a severity of 3/10. Pertinent negatives include no fever. Associated symptoms comments: Patient states her left wrist hurts and states the pain goes up and down her forearm.  She denies recent injury.  Noticed sxs when folding laundry last Saturday.. The symptoms are aggravated by activity. Treatments tried: Bayer and Valium  The treatment provided mild relief.  Patient is right-handed and [facility] GFR= >89 on 05/22/14 Review of Systems  Constitutional: Positive for fatigue. Negative for fever, chills and diaphoresis.  HENT: Positive for sinus pressure. Negative for congestion, ear discharge, ear pain, postnasal drip, rhinorrhea, sore throat, trouble swallowing and voice change.   Eyes: Negative.   Respiratory: Negative.   Cardiovascular: Negative.   Gastrointestinal: Negative.   Genitourinary: Negative.   Musculoskeletal: Negative.        Except left wrist pain  Skin: Negative.  Negative for rash.  Neurological: Positive for headaches. Negative for dizziness and light-headedness.  Psychiatric/Behavioral: The patient is nervous/anxious.        Anxiety and stress and no depression   Past Medical History  Diagnosis Date  . Hypertension   . Headache(784.0)     migraines  . IBS (irritable bowel syndrome)   . Seasonal allergies   . Insulin resistance   . Anxiety   . Depression    Current Outpatient Prescriptions on File Prior to Visit  Medication Sig Dispense Refill  . ALPRAZolam  (XANAX) 1 MG tablet Use 1/2-1 pill BID 60 tablet 0  . atenolol (TENORMIN) 50 MG tablet TAKE ONE TABLET AT BEDTIME 30 tablet 5  . Biotin 10 MG TABS Take by mouth daily.    . cetirizine (ZYRTEC) 10 MG tablet Take 10 mg by mouth daily as needed for allergies.    . Cholecalciferol (VITAMIN D3) 5000 UNITS TABS Take 5,000 Units by mouth at bedtime.    . clindamycin (CLINDAGEL) 1 % gel Apply topically 2 (two) times daily. 30 g 4  . diazepam (VALIUM) 2 MG tablet TAKE 1/2 TO 1 TABLET AT BEDTIME 30 tablet 2  . fluticasone (FLONASE) 50 MCG/ACT nasal spray Use 1 to 2  nasal sprays to each nostril daily 16 g 99  . fluvoxaMINE (LUVOX) 50 MG tablet TAKE ONE TABLET AT BEDTIME 30 tablet 3  . HYDROcodone-acetaminophen (NORCO) 5-325 MG per tablet Take 1 tablet by mouth every 6 (six) hours as needed for moderate pain. 30 tablet 0  . hydrocortisone (ANUSOL-HC) 2.5 % rectal cream Place 1 application rectally daily as needed for hemorrhoids or itching.    Marland Kitchen. ibuprofen (ADVIL,MOTRIN) 200 MG tablet Take 400-600 mg by mouth every 6 (six) hours as needed for headache.    . Magnesium 300 MG CAPS Take by mouth daily.    . ondansetron (ZOFRAN) 4 MG tablet Take 1 tablet (4 mg total) by mouth daily as needed for nausea or vomiting. 30 tablet 2  . oxyCODONE-acetaminophen (ROXICET) 5-325 MG per tablet Take 1  tablet by mouth every 6 (six) hours as needed. 30 tablet 0  . [DISCONTINUED] Drospirenone-Ethinyl Estradiol-Levomefol (SAFYRAL) 3-0.03-0.451 MG tablet Take 1 tablet by mouth daily. 1 Package 3   No current facility-administered medications on file prior to visit.   No Known Allergies     BP 126/90 mmHg  Pulse 76  Temp(Src) 98.4 F (36.9 C) (Temporal)  Resp 16  Ht 5\' 4"  (1.626 m)  Wt 194 lb (87.998 kg)  BMI 33.28 kg/m2  SpO2 98%  LMP 07/14/2014 Wt Readings from Last 3 Encounters:  09/18/14 194 lb (87.998 kg)  07/26/14 189 lb (85.73 kg)  07/07/14 188 lb (85.276 kg)   Objective:   Physical Exam  Constitutional:  She is oriented to person, place, and time. She appears well-developed and well-nourished. She does not have a sickly appearance. No distress.  HENT:  Head: Normocephalic.  Eyes: Conjunctivae and lids are normal. Right eye exhibits no discharge. Left eye exhibits no discharge. No scleral icterus.  Neck: Normal range of motion and phonation normal.  Cardiovascular: Normal rate, regular rhythm, S1 normal, S2 normal, normal heart sounds, intact distal pulses and normal pulses.  Exam reveals no gallop, no distant heart sounds and no friction rub.   No murmur heard. Pulmonary/Chest: Effort normal and breath sounds normal. No respiratory distress. She has no decreased breath sounds. She has no wheezes. She has no rhonchi. She has no rales. She exhibits no tenderness.  Abdominal: Soft. Bowel sounds are normal.  Musculoskeletal: Normal range of motion.       Right wrist: Normal.       Left wrist: She exhibits tenderness. She exhibits normal range of motion, no bony tenderness, no swelling, no effusion, no crepitus, no deformity and no laceration.  Positive Tinel's Sign and positive Phalen's sign on left wrist only.    Neurological: She is alert and oriented to person, place, and time. She has normal reflexes. No sensory deficit. Gait normal.  Strength slightly decreased in left wrist compared to right wrist.  Skin: Skin is warm and dry. No rash noted. She is not diaphoretic.  Psychiatric: She has a normal mood and affect. Her behavior is normal. Judgment and thought content normal.  Vitals reviewed.  Assessment & Plan:  1. Carpal tunnel syndrome of left wrist -Take Prednisone as prescribed for inflammation- predniSONE (DELTASONE) 20 MG tablet; Take 3 tablets PO QDaily for 3 days, then take 2 tablets PO QDaily for 3 days, then take 1 tablet PO QDaily for 3 days  Dispense: 18 tablet; Refill: 0 -Please rest, ice and use compression brace on left wrist for support. - Ambulatory referral to Orthopedic  Surgery- [facility]  Discussed medication effects and SE's.  Pt agreed to treatment plan. If you are not feeling better in 10-14 days, then please call the office. Please follow up in January 2016 for regular follow up with labs.  Donzella Carrol, Lise AuerJennifer L, PA-C 4:21 PM [facility]

## 2014-09-20 ENCOUNTER — Encounter (HOSPITAL_COMMUNITY): Payer: Self-pay | Admitting: *Deleted

## 2014-09-20 ENCOUNTER — Ambulatory Visit (HOSPITAL_COMMUNITY): Payer: BC Managed Care – PPO | Attending: Physician Assistant

## 2014-09-20 ENCOUNTER — Emergency Department (HOSPITAL_COMMUNITY)
Admission: EM | Admit: 2014-09-20 | Discharge: 2014-09-20 | Disposition: A | Discharge status: Home / Self Care | Payer: BC Managed Care – PPO | Source: Home / Self Care | Attending: Emergency Medicine | Admitting: Emergency Medicine

## 2014-09-20 DIAGNOSIS — M4186 Other forms of scoliosis, lumbar region: Secondary | ICD-10-CM | POA: Diagnosis not present

## 2014-09-20 DIAGNOSIS — R112 Nausea with vomiting, unspecified: Secondary | ICD-10-CM

## 2014-09-20 DIAGNOSIS — R935 Abnormal findings on diagnostic imaging of other abdominal regions, including retroperitoneum: Secondary | ICD-10-CM | POA: Insufficient documentation

## 2014-09-20 DIAGNOSIS — R110 Nausea: Secondary | ICD-10-CM | POA: Diagnosis present

## 2014-09-20 DIAGNOSIS — R1032 Left lower quadrant pain: Secondary | ICD-10-CM | POA: Diagnosis present

## 2014-09-20 DIAGNOSIS — R197 Diarrhea, unspecified: Secondary | ICD-10-CM

## 2014-09-20 DIAGNOSIS — R109 Unspecified abdominal pain: Secondary | ICD-10-CM

## 2014-09-20 LAB — POCT URINALYSIS DIP (DEVICE)
Bilirubin Urine: NEGATIVE
GLUCOSE, UA: NEGATIVE mg/dL
Ketones, ur: NEGATIVE mg/dL
NITRITE: NEGATIVE
PH: 6 (ref 5.0–8.0)
Protein, ur: NEGATIVE mg/dL
Specific Gravity, Urine: 1.025 (ref 1.005–1.030)
Urobilinogen, UA: 0.2 mg/dL (ref 0.0–1.0)

## 2014-09-20 LAB — POCT PREGNANCY, URINE: Preg Test, Ur: NEGATIVE

## 2014-09-20 MED ORDER — HYOSCYAMINE SULFATE 0.125 MG SL SUBL: 0.1250 mg | SUBLINGUAL_TABLET | Freq: Four times a day (QID) | SUBLINGUAL | Status: DC | PRN

## 2014-09-20 MED ORDER — ONDANSETRON HCL 4 MG PO TABS: 4.0000 mg | ORAL_TABLET | Freq: Three times a day (TID) | ORAL | Status: DC | PRN

## 2014-09-20 NOTE — Discharge Instructions (Signed)
Urine studies unremarkable. Will send for culture and contact you by phone to treat if result indicate.  Urine pregnancy negative Abdominal films unremarkable Will send patient with stool collection kit to return for C-Diff testing when specimen available. Please return with specimen when available. Exam with mild diffuse periumbilical discomfort. May be a simple gastroenteritis (viral stomach flu).  Use zofran and levsin as prescribed and advise sticking to a clear liquid diet over next 12-24 hours with careful observation at home. Will advise if symptoms become suddenly worse or severe/persistent, or if you develop fever or blood in stool, will need to be re-evaluated at nearest ER.  Abdominal Pain, Women Abdominal (stomach, pelvic, or belly) pain can be caused by many things. It is important to tell your doctor:  The location of the pain.  Does it come and go or is it present all the time?  Are there things that start the pain (eating certain foods, exercise)?  Are there other symptoms associated with the pain (fever, nausea, vomiting, diarrhea)? All of this is helpful to know when trying to find the cause of the pain. CAUSES   Stomach: virus or bacteria infection, or ulcer.  Intestine: appendicitis (inflamed appendix), regional ileitis (Crohn's disease), ulcerative colitis (inflamed colon), irritable bowel syndrome, diverticulitis (inflamed diverticulum of the colon), or cancer of the stomach or intestine.  Gallbladder disease or stones in the gallbladder.  Kidney disease, kidney stones, or infection.  Pancreas infection or cancer.  Fibromyalgia (pain disorder).  Diseases of the female organs:  Uterus: fibroid (non-cancerous) tumors or infection.  Fallopian tubes: infection or tubal pregnancy.  Ovary: cysts or tumors.  Pelvic adhesions (scar tissue).  Endometriosis (uterus lining tissue growing in the pelvis and on the pelvic organs).  Pelvic congestion syndrome  (female organs filling up with blood just before the menstrual period).  Pain with the menstrual period.  Pain with ovulation (producing an egg).  Pain with an IUD (intrauterine device, birth control) in the uterus.  Cancer of the female organs.  Functional pain (pain not caused by a disease, may improve without treatment).  Psychological pain.  Depression. DIAGNOSIS  Your doctor will decide the seriousness of your pain by doing an examination.  Blood tests.  X-rays.  Ultrasound.  CT scan (computed tomography, special type of X-ray).  MRI (magnetic resonance imaging).  Cultures, for infection.  Barium enema (dye inserted in the large intestine, to better view it with X-rays).  Colonoscopy (looking in intestine with a lighted tube).  Laparoscopy (minor surgery, looking in abdomen with a lighted tube).  Major abdominal exploratory surgery (looking in abdomen with a large incision). TREATMENT  The treatment will depend on the cause of the pain.   Many cases can be observed and treated at home.  Over-the-counter medicines recommended by your caregiver.  Prescription medicine.  Antibiotics, for infection.  Birth control pills, for painful periods or for ovulation pain.  Hormone treatment, for endometriosis.  Nerve blocking injections.  Physical therapy.  Antidepressants.  Counseling with a psychologist or psychiatrist.  Minor or major surgery. HOME CARE INSTRUCTIONS   Do not take laxatives, unless directed by your caregiver.  Take over-the-counter pain medicine only if ordered by your caregiver. Do not take aspirin because it can cause an upset stomach or bleeding.  Try a clear liquid diet (broth or water) as ordered by your caregiver. Slowly move to a bland diet, as tolerated, if the pain is related to the stomach or intestine.  Have a thermometer and  take your temperature several times a day, and record it.  Bed rest and sleep, if it helps the  pain.  Avoid sexual intercourse, if it causes pain.  Avoid stressful situations.  Keep your follow-up appointments and tests, as your caregiver orders.  If the pain does not go away with medicine or surgery, you may try:  Acupuncture.  Relaxation exercises (yoga, meditation).  Group therapy.  Counseling. SEEK MEDICAL CARE IF:   You notice certain foods cause stomach pain.  Your home care treatment is not helping your pain.  You need stronger pain medicine.  You want your IUD removed.  You feel faint or lightheaded.  You develop nausea and vomiting.  You develop a rash.  You are having side effects or an allergy to your medicine. SEEK IMMEDIATE MEDICAL CARE IF:   Your pain does not go away or gets worse.  You have a fever.  Your pain is felt only in portions of the abdomen. The right side could possibly be appendicitis. The left lower portion of the abdomen could be colitis or diverticulitis.  You are passing blood in your stools (bright red or black tarry stools, with or without vomiting).  You have blood in your urine.  You develop chills, with or without a fever.  You pass out. MAKE SURE YOU:   Understand these instructions.  Will watch your condition.  Will get help right away if you are not doing well or get worse. Document Released: 07/20/2007 Document Revised: 02/06/2014 Document Reviewed: 08/09/2009 [facility] Patient Information 2015 [place], [place]. This information is not intended to replace advice given to you by your health care provider. Make sure you discuss any questions you have with your health care provider.  Clostridium Difficile Toxin This is a test which may be done when a patient has diarrhea that lasts for several days, or has abdominal pain, fever, and nausea after antibiotic therapy.  This test looks for the presence of Clostridium difficile (C.diff.) toxin in a stool sample. C.diff. is a germ (bacterium) that is one of the groups of  bacteria that are usually in the colon, called "normal flora." If something upsets the growth of the other normal flora, C.diff. may overgrow and disrupt the balance of bacteria in the colon. C. diff. may produce two toxins, A and B. The combination of overgrowth and toxins may cause prolonged diarrhea. The toxins may damage the lining of the colon and lead to colitis.  While some cases of C. diff. diarrhea and colitis do not require treatment, others require specific oral antibiotic therapy. Most patients improve as the normal flora re-establishes itself, but about some may have one or more relapses, with symptoms and detectible toxin levels coming back. PREPARATION FOR TEST There is no special preparation for the test. A fresh stool sample is collected in a sterile container. The sample should not be mixed with urine or water. The stool should be taken to the lab within an hour. It may be refrigerated or frozen and taken to the lab as soon as possible. The container should be labeled with your name and the date and time of the stool collection.  NORMAL FINDINGS Negative Tissue Culture (no toxin identified) Ranges for normal findings may vary among different laboratories and hospitals. You should always check with your doctor after having lab work or other tests done to discuss the meaning of your test results and whether your values are considered within normal limits. MEANING OF TEST  Your caregiver will  go over the test results with you and discuss the importance and meaning of your results, as well as treatment options and the need for additional tests if necessary. OBTAINING THE TEST RESULTS It is your responsibility to obtain your test results. Ask the lab or department performing the test when and how you will get your results. Document Released: 10/15/2004 Document Revised: 12/15/2011 Document Reviewed: 08/31/2008 [facility] Patient Information 2015 [place], [place]. This information is not  intended to replace advice given to you by your health care provider. Make sure you discuss any questions you have with your health care provider.  Diarrhea Diarrhea is frequent loose and watery bowel movements. It can cause you to feel weak and dehydrated. Dehydration can cause you to become tired and thirsty, have a dry mouth, and have decreased urination that often is dark yellow. Diarrhea is a sign of another problem, most often an infection that will not last long. In most cases, diarrhea typically lasts 2-3 days. However, it can last longer if it is a sign of something more serious. It is important to treat your diarrhea as directed by your caregiver to lessen or prevent future episodes of diarrhea. CAUSES  Some common causes include:  Gastrointestinal infections caused by viruses, bacteria, or parasites.  Food poisoning or food allergies.  Certain medicines, such as antibiotics, chemotherapy, and laxatives.  Artificial sweeteners and fructose.  Digestive disorders. HOME CARE INSTRUCTIONS  Ensure adequate fluid intake (hydration): Have 1 cup (8 oz) of fluid for each diarrhea episode. Avoid fluids that contain simple sugars or sports drinks, fruit juices, whole milk products, and sodas. Your urine should be clear or pale yellow if you are drinking enough fluids. Hydrate with an oral rehydration solution that you can purchase at pharmacies, retail stores, and online. You can prepare an oral rehydration solution at home by mixing the following ingredients together:   - tsp table salt.   tsp baking soda.   tsp salt substitute containing potassium chloride.  1  tablespoons sugar.  1 L (34 oz) of water.  Certain foods and beverages may increase the speed at which food moves through the gastrointestinal (GI) tract. These foods and beverages should be avoided and include:  Caffeinated and alcoholic beverages.  High-fiber foods, such as raw fruits and vegetables, nuts, seeds, and  whole grain breads and cereals.  Foods and beverages sweetened with sugar alcohols, such as xylitol, sorbitol, and mannitol.  Some foods may be well tolerated and may help thicken stool including:  Starchy foods, such as rice, toast, pasta, low-sugar cereal, oatmeal, grits, baked potatoes, crackers, and bagels.  Bananas.  Applesauce.  Add probiotic-rich foods to help increase healthy bacteria in the GI tract, such as yogurt and fermented milk products.  Wash your hands well after each diarrhea episode.  Only take over-the-counter or prescription medicines as directed by your caregiver.  Take a warm bath to relieve any burning or pain from frequent diarrhea episodes. SEEK IMMEDIATE MEDICAL CARE IF:   You are unable to keep fluids down.  You have persistent vomiting.  You have blood in your stool, or your stools are black and tarry.  You do not urinate in 6-8 hours, or there is only a small amount of very dark urine.  You have abdominal pain that increases or localizes.  You have weakness, dizziness, confusion, or light-headedness.  You have a severe headache.  Your diarrhea gets worse or does not get better.  You have a fever or persistent symptoms for  more than 2-3 days.  You have a fever and your symptoms suddenly get worse. MAKE SURE YOU:   Understand these instructions.  Will watch your condition.  Will get help right away if you are not doing well or get worse. Document Released: 09/12/2002 Document Revised: 02/06/2014 Document Reviewed: 05/30/2012 [facility] Patient Information 2015 [place], [place]. This information is not intended to replace advice given to you by your health care provider. Make sure you discuss any questions you have with your health care provider.  Food Choices to Help Relieve Diarrhea When you have diarrhea, the foods you eat and your eating habits are very important. Choosing the right foods and drinks can help relieve diarrhea. Also,  because diarrhea can last up to 7 days, you need to replace lost fluids and electrolytes (such as sodium, potassium, and chloride) in order to help prevent dehydration.  WHAT GENERAL GUIDELINES DO I NEED TO FOLLOW?  Slowly drink 1 cup (8 oz) of fluid for each episode of diarrhea. If you are getting enough fluid, your urine will be clear or pale yellow.  Eat starchy foods. Some good choices include white rice, white toast, pasta, low-fiber cereal, baked potatoes (without the skin), saltine crackers, and bagels.  Avoid large servings of any cooked vegetables.  Limit fruit to two servings per day. A serving is  cup or 1 small piece.  Choose foods with less than 2 g of fiber per serving.  Limit fats to less than 8 tsp (38 g) per day.  Avoid fried foods.  Eat foods that have probiotics in them. Probiotics can be found in certain dairy products.  Avoid foods and beverages that may increase the speed at which food moves through the stomach and intestines (gastrointestinal tract). Things to avoid include:  High-fiber foods, such as dried fruit, raw fruits and vegetables, nuts, seeds, and whole grain foods.  Spicy foods and high-fat foods.  Foods and beverages sweetened with high-fructose corn syrup, honey, or sugar alcohols such as xylitol, sorbitol, and mannitol. WHAT FOODS ARE RECOMMENDED? Grains White rice. White, [place], or pita breads (fresh or toasted), including plain rolls, buns, or bagels. White pasta. Saltine, soda, or graham crackers. Pretzels. Low-fiber cereal. Cooked cereals made with water (such as cornmeal, farina, or cream cereals). Plain muffins. Matzo. Melba toast. Zwieback.  Vegetables Potatoes (without the skin). Strained tomato and vegetable juices. Most well-cooked and canned vegetables without seeds. Tender lettuce. Fruits Cooked or canned applesauce, apricots, cherries, fruit cocktail, grapefruit, peaches, pears, or plums. Fresh bananas, apples without skin, cherries,  grapes, cantaloupe, grapefruit, peaches, oranges, or plums.  Meat and Other Protein Products Baked or boiled chicken. Eggs. Tofu. Fish. Seafood. Smooth peanut butter. Ground or well-cooked tender beef, ham, veal, lamb, pork, or poultry.  Dairy Plain yogurt, kefir, and unsweetened liquid yogurt. Lactose-free milk, buttermilk, or soy milk. Plain hard cheese. Beverages Sport drinks. Clear broths. Diluted fruit juices (except prune). Regular, caffeine-free sodas such as ginger ale. Water. Decaffeinated teas. Oral rehydration solutions. Sugar-free beverages not sweetened with sugar alcohols. Other Bouillon, broth, or soups made from recommended foods.  The items listed above may not be a complete list of recommended foods or beverages. Contact your dietitian for more options. WHAT FOODS ARE NOT RECOMMENDED? Grains Whole grain, whole wheat, bran, or rye breads, rolls, pastas, crackers, and cereals. Wild or brown rice. Cereals that contain more than 2 g of fiber per serving. Corn tortillas or taco shells. Cooked or dry oatmeal. Granola. Popcorn. Vegetables Raw vegetables. Cabbage, broccoli,  Brussels sprouts, artichokes, baked beans, beet greens, corn, kale, legumes, peas, sweet potatoes, and yams. Potato skins. Cooked spinach and cabbage. Fruits Dried fruit, including raisins and dates. Raw fruits. Stewed or dried prunes. Fresh apples with skin, apricots, mangoes, pears, raspberries, and strawberries.  Meat and Other Protein Products Chunky peanut butter. Nuts and seeds. Beans and lentils. Berniece Salines.  Dairy High-fat cheeses. Milk, chocolate milk, and beverages made with milk, such as milk shakes. Cream. Ice cream. Sweets and Desserts Sweet rolls, doughnuts, and sweet breads. Pancakes and waffles. Fats and Oils Butter. Cream sauces. Margarine. Salad oils. Plain salad dressings. Olives. Avocados.  Beverages Caffeinated beverages (such as coffee, tea, soda, or energy drinks). Alcoholic beverages. Fruit  juices with pulp. Prune juice. Soft drinks sweetened with high-fructose corn syrup or sugar alcohols. Other Coconut. Hot sauce. Chili powder. Mayonnaise. Gravy. Cream-based or milk-based soups.  The items listed above may not be a complete list of foods and beverages to avoid. Contact your dietitian for more information. WHAT SHOULD I DO IF I BECOME DEHYDRATED? Diarrhea can sometimes lead to dehydration. Signs of dehydration include dark urine and dry mouth and skin. If you think you are dehydrated, you should rehydrate with an oral rehydration solution. These solutions can be purchased at pharmacies, retail stores, or online.  Drink -1 cup (120-240 mL) of oral rehydration solution each time you have an episode of diarrhea. If drinking this amount makes your diarrhea worse, try drinking smaller amounts more often. For example, drink 1-3 tsp (5-15 mL) every 5-10 minutes.  A general rule for staying hydrated is to drink 1-2 L of fluid per day. Talk to your health care provider about the specific amount you should be drinking each day. Drink enough fluids to keep your urine clear or pale yellow. Document Released: 12/13/2003 Document Revised: 09/27/2013 Document Reviewed: 08/15/2013 [facility] Patient Information 2015 [place], [place]. This information is not intended to replace advice given to you by your health care provider. Make sure you discuss any questions you have with your health care provider.  Nausea and Vomiting Nausea is a sick feeling that often comes before throwing up (vomiting). Vomiting is a reflex where stomach contents come out of your mouth. Vomiting can cause severe loss of body fluids (dehydration). Children and elderly adults can become dehydrated quickly, especially if they also have diarrhea. Nausea and vomiting are symptoms of a condition or disease. It is important to find the cause of your symptoms. CAUSES   Direct irritation of the stomach lining. This irritation can result  from increased acid production (gastroesophageal reflux disease), infection, food poisoning, taking certain medicines (such as nonsteroidal anti-inflammatory drugs), alcohol use, or tobacco use.  Signals from the brain.These signals could be caused by a headache, heat exposure, an inner ear disturbance, increased pressure in the brain from injury, infection, a tumor, or a concussion, pain, emotional stimulus, or metabolic problems.  An obstruction in the gastrointestinal tract (bowel obstruction).  Illnesses such as diabetes, hepatitis, gallbladder problems, appendicitis, kidney problems, cancer, sepsis, atypical symptoms of a heart attack, or eating disorders.  Medical treatments such as chemotherapy and radiation.  Receiving medicine that makes you sleep (general anesthetic) during surgery. DIAGNOSIS Your caregiver may ask for tests to be done if the problems do not improve after a few days. Tests may also be done if symptoms are severe or if the reason for the nausea and vomiting is not clear. Tests may include:  Urine tests.  Blood tests.  Stool tests.  Cultures (  to look for evidence of infection).  X-rays or other imaging studies. Test results can help your caregiver make decisions about treatment or the need for additional tests. TREATMENT You need to stay well hydrated. Drink frequently but in small amounts.You may wish to drink water, sports drinks, clear broth, or eat frozen ice pops or gelatin dessert to help stay hydrated.When you eat, eating slowly may help prevent nausea.There are also some antinausea medicines that may help prevent nausea. HOME CARE INSTRUCTIONS   Take all medicine as directed by your caregiver.  If you do not have an appetite, do not force yourself to eat. However, you must continue to drink fluids.  If you have an appetite, eat a normal diet unless your caregiver tells you differently.  Eat a variety of complex carbohydrates (rice, wheat,  potatoes, bread), lean meats, yogurt, fruits, and vegetables.  Avoid high-fat foods because they are more difficult to digest.  Drink enough water and fluids to keep your urine clear or pale yellow.  If you are dehydrated, ask your caregiver for specific rehydration instructions. Signs of dehydration may include:  Severe thirst.  Dry lips and mouth.  Dizziness.  Dark urine.  Decreasing urine frequency and amount.  Confusion.  Rapid breathing or pulse. SEEK IMMEDIATE MEDICAL CARE IF:   You have blood or brown flecks (like coffee grounds) in your vomit.  You have black or bloody stools.  You have a severe headache or stiff neck.  You are confused.  You have severe abdominal pain.  You have chest pain or trouble breathing.  You do not urinate at least once every 8 hours.  You develop cold or clammy skin.  You continue to vomit for longer than 24 to 48 hours.  You have a fever. MAKE SURE YOU:   Understand these instructions.  Will watch your condition.  Will get help right away if you are not doing well or get worse. Document Released: 09/22/2005 Document Revised: 12/15/2011 Document Reviewed: 02/19/2011 [facility] Patient Information 2015 [place], [place]. This information is not intended to replace advice given to you by your health care provider. Make sure you discuss any questions you have with your health care provider.

## 2014-09-20 NOTE — ED Notes (Signed)
Pt reports   Symptoms     Of         Nausea   Diarrhea          yest     Woke  Up  With  Abdominal  Pain  Today     -  denys  Any  Vomiting        Ambulates  With a  Steady  Fluid  Gait

## 2014-09-20 NOTE — ED Provider Notes (Signed)
CSN: [id number]     Arrival date & time 09/20/14  1540 History   First MD Initiated Contact with Patient 09/20/14 1657     Chief Complaint  Patient presents with  . Abdominal Pain   (Consider location/radiation/quality/duration/timing/severity/associated sxs/prior Treatment) HPI Comments: LNMP: 2 mos ago. Recently transitioned to new form of oral contraception Previous surgery: C-sect Aug 2014 Hx of C-diff colitis in march 2015. No recent abx use. Was started on oral prednisone for left wrist tendonitis 2 days ago.   Patient is a 28 y.o. female presenting with abdominal pain. The history is provided by the patient.  Abdominal Pain Pain location:  Periumbilical Pain quality: cramping   Pain severity:  Moderate Onset quality:  Gradual Duration:  2 days Progression:  Waxing and waning Chronicity:  New Associated symptoms: anorexia, diarrhea, nausea and vomiting   Associated symptoms: no chills, no constipation, no cough, no dysuria, no fatigue, no fever, no flatus, no hematemesis, no hematochezia, no hematuria, no melena, no shortness of breath, no sore throat, no vaginal bleeding and no vaginal discharge     Past Medical History  Diagnosis Date  . Hypertension   . Headache(784.0)     migraines  . IBS (irritable bowel syndrome)   . Seasonal allergies   . Insulin resistance   . Anxiety   . Depression    Past Surgical History  Procedure Laterality Date  . Wisdom tooth extraction    . Cesarean section N/A 06/05/2013    Procedure: Primary cesarean section with delivery of baby boy at 1033.  Apgars 1/8.;  Surgeon: Princess Bruins, MD;  Location: [facility];  Service: Obstetrics;  Laterality: N/A;   History reviewed. No pertinent family history. History  Substance Use Topics  . Smoking status: Never Smoker   . Smokeless tobacco: Never Used  . Alcohol Use: No   OB History    Gravida Para Term Preterm AB TAB SAB Ectopic Multiple Living   '2 1  1 1  1   1     ' Review of Systems   Constitutional: Negative for fever, chills and fatigue.  HENT: Negative for sore throat.   Respiratory: Negative for cough and shortness of breath.   Cardiovascular: Negative.   Gastrointestinal: Positive for nausea, vomiting, abdominal pain, diarrhea and anorexia. Negative for constipation, melena, hematochezia, flatus and hematemesis.  Genitourinary: Positive for menstrual problem. Negative for dysuria, hematuria, vaginal bleeding and vaginal discharge.  Musculoskeletal: Negative for back pain.  Skin: Negative.   Neurological: Negative for dizziness, weakness and headaches.    Allergies  Review of patient's allergies indicates no known allergies.  Home Medications   Prior to Admission medications   Medication Sig Start Date End Date Taking? Authorizing Provider  ALPRAZolam Duanne Moron) 1 MG tablet Use 1/2-1 pill BID 06/08/14   Vicie Mutters, PA-C  atenolol (TENORMIN) 50 MG tablet TAKE ONE TABLET AT BEDTIME 08/11/14   Vicie Mutters, PA-C  Biotin 10 MG TABS Take by mouth daily.    Historical Provider, MD  cetirizine (ZYRTEC) 10 MG tablet Take 10 mg by mouth daily as needed for allergies.    Historical Provider, MD  Cholecalciferol (VITAMIN D3) 5000 UNITS TABS Take 5,000 Units by mouth at bedtime.    Historical Provider, MD  clindamycin (CLINDAGEL) 1 % gel Apply topically 2 (two) times daily. 09/04/14   Vicie Mutters, PA-C  diazepam (VALIUM) 2 MG tablet TAKE 1/2 TO 1 TABLET AT BEDTIME 09/05/14   Vicie Mutters, PA-C  fluticasone [facility]) 50 MCG/ACT nasal  spray Use 1 to 2  nasal sprays to each nostril daily 08/14/14 08/15/15  Unk Pinto, MD  fluvoxaMINE (LUVOX) 50 MG tablet TAKE ONE TABLET AT BEDTIME 08/01/14   Vicie Mutters, PA-C  HYDROcodone-acetaminophen (NORCO) 5-325 MG per tablet Take 1 tablet by mouth every 6 (six) hours as needed for moderate pain. 05/22/14   Vicie Mutters, PA-C  hydrocortisone (ANUSOL-HC) 2.5 % rectal cream Place 1 application rectally daily as needed for  hemorrhoids or itching.    Historical Provider, MD  hyoscyamine (LEVSIN/SL) 0.125 MG SL tablet Place 1 tablet (0.125 mg total) under the tongue every 6 (six) hours as needed. For abdominal cramping 09/20/14   Audelia Hives Haiden Clucas, PA  ibuprofen (ADVIL,MOTRIN) 200 MG tablet Take 400-600 mg by mouth every 6 (six) hours as needed for headache.    Historical Provider, MD  Magnesium 300 MG CAPS Take by mouth daily.    Historical Provider, MD  ondansetron (ZOFRAN) 4 MG tablet Take 1 tablet (4 mg total) by mouth daily as needed for nausea or vomiting. 02/17/14 02/17/15  Vicie Mutters, PA-C  ondansetron (ZOFRAN) 4 MG tablet Take 1 tablet (4 mg total) by mouth every 8 (eight) hours as needed for nausea or vomiting. 09/20/14   Lutricia Feil, PA  oxyCODONE-acetaminophen (ROXICET) 5-325 MG per tablet Take 1 tablet by mouth every 6 (six) hours as needed. 07/07/14 07/07/15  Vicie Mutters, PA-C  predniSONE (DELTASONE) 20 MG tablet Take 3 tablets PO QDaily for 3 days, then take 2 tablets PO QDaily for 3 days, then take 1 tablet PO QDaily for 3 days 09/18/14 09/26/14  Anderson Malta L Couillard, PA-C   BP 122/80 mmHg  Pulse 84  Temp(Src) 98.6 F (37 C) (Oral)  Resp 16  SpO2 98%  LMP 07/14/2014 Physical Exam  Constitutional: She is oriented to person, place, and time. She appears well-developed and well-nourished. No distress.  HENT:  Head: Normocephalic and atraumatic.  Mouth/Throat: Oropharynx is clear and moist.  Eyes: Conjunctivae are normal. No scleral icterus.  Cardiovascular: Normal rate, regular rhythm and normal heart sounds.   Pulmonary/Chest: Effort normal and breath sounds normal.  Abdominal: Soft. Normal appearance and bowel sounds are normal. She exhibits no distension and no mass. There is tenderness in the periumbilical area. There is no rigidity, no rebound, no guarding and no CVA tenderness.    Outline area is region of tenderness  Musculoskeletal: Normal range of motion.    Neurological: She is alert and oriented to person, place, and time.  Skin: Skin is warm and dry. No rash noted. No erythema.  Psychiatric: She has a normal mood and affect. Her behavior is normal.  Nursing note and vitals reviewed.   ED Course  Procedures (including critical care time) Labs Review Labs Reviewed  POCT URINALYSIS DIP (DEVICE) - Abnormal; Notable for the following:    Hgb urine dipstick SMALL (*)    Leukocytes, UA TRACE (*)    All other components within normal limits  CLOSTRIDIUM DIFFICILE BY PCR  URINE CULTURE  POCT PREGNANCY, URINE    Imaging Review Dg Abd 2 Views  09/20/2014   CLINICAL DATA:  Left lower quadrant pain starting yesterday, nausea and diarrhea  EXAM: ABDOMEN - 2 VIEW  COMPARISON:  None.  FINDINGS: There is nonspecific nonobstructive bowel gas pattern. Mild lumbar dextroscoliosis. No free abdominal air. Some stool noted in right colon.  IMPRESSION: Nonspecific nonobstructive bowel gas pattern. No free abdominal air. Mild dextroscoliosis lumbar spine.   Electronically Signed  By: Lahoma Crocker M.D.   On: 09/20/2014 17:53     MDM   1. Non-intractable vomiting with nausea, vomiting of unspecified type   2. Abdominal pain   3. Diarrhea   UA only notable for trace LE and heme. Will send for C&S and treat if result indicate.  UPT negative Abdominal films unremarkable Will send patient with stool collection kit to return for C-Diff testing when specimen available. Exam with mild diffuse periumbilical discomfort. May be a simple gastroenteritis. Nausea resolving. Diarrhea improving. Will provide Rx for zofran and levsin and advise sticking to a clear liquid diet over next 12-24 hours with careful observation at home. Will advise if symptoms become suddenly worse or severe/persistent, or she develops fever or blood in stool, will need to be re-evaluated at nearest ER.   Lutricia Feil, [place] 09/20/14 [phone]

## 2014-10-10 ENCOUNTER — Ambulatory Visit (INDEPENDENT_AMBULATORY_CARE_PROVIDER_SITE_OTHER): Payer: BLUE CROSS/BLUE SHIELD | Admitting: Physician Assistant

## 2014-10-10 ENCOUNTER — Encounter: Payer: Self-pay | Admitting: Physician Assistant

## 2014-10-10 VITALS — BP 118/82 | HR 86 | Temp 100.8°F | Resp 18 | Ht 64.0 in | Wt 192.0 lb

## 2014-10-10 DIAGNOSIS — J029 Acute pharyngitis, unspecified: Secondary | ICD-10-CM

## 2014-10-10 DIAGNOSIS — K5900 Constipation, unspecified: Secondary | ICD-10-CM

## 2014-10-10 DIAGNOSIS — R509 Fever, unspecified: Secondary | ICD-10-CM

## 2014-10-10 LAB — POCT INFLUENZA A/B
INFLUENZA A, POC: NEGATIVE
Influenza B, POC: NEGATIVE

## 2014-10-10 MED ORDER — PREDNISONE 10 MG PO TABS: ORAL_TABLET | ORAL | Status: DC

## 2014-10-10 MED ORDER — POLYETHYLENE GLYCOL 3350 17 G PO PACK: 17.0000 g | PACK | Freq: Every day | ORAL | Status: DC

## 2014-10-10 MED ORDER — AZITHROMYCIN 250 MG PO TABS: ORAL_TABLET | ORAL | Status: AC

## 2014-10-10 NOTE — Patient Instructions (Signed)
-  Take Z-Pak as prescribed -Take prednisone as prescribed for inflammation Please make sure you are drinking plenty of water to stay hydrated. -Start taking Metamucil for constipation. -Take Tylenol or Ibuprofen OTC for fever and inflammation.  Take with food.  Follow directions on the bottle. - Salt water gargles. You can also do 1 TSP liquid Maalox and 1 TSP liquid benadryl- mix/ gargle/ spit -Use Chloraseptic spray over the counter to help with pain in throat.   If you are not feeling better in 10-14 days, then please call the office.   Pharyngitis Pharyngitis is redness, pain, and swelling (inflammation) of your pharynx.  CAUSES  Pharyngitis is usually caused by infection. Most of the time, these infections are from viruses (viral) and are part of a cold. However, sometimes pharyngitis is caused by bacteria (bacterial). Pharyngitis can also be caused by allergies. Viral pharyngitis may be spread from person to person by coughing, sneezing, and personal items or utensils (cups, forks, spoons, toothbrushes). Bacterial pharyngitis may be spread from person to person by more intimate contact, such as kissing.  SIGNS AND SYMPTOMS  Symptoms of pharyngitis include:   Sore throat.   Tiredness (fatigue).   Low-grade fever.   Headache.  Joint pain and muscle aches.  Skin rashes.  Swollen lymph nodes.  Plaque-like film on throat or tonsils (often seen with bacterial pharyngitis). DIAGNOSIS  Your health care provider will ask you questions about your illness and your symptoms. Your medical history, along with a physical exam, is often all that is needed to diagnose pharyngitis. Sometimes, a rapid strep test is done. Other lab tests may also be done, depending on the suspected cause.  TREATMENT  Viral pharyngitis will usually get better in 3-4 days without the use of medicine. Bacterial pharyngitis is treated with medicines that kill germs (antibiotics).  HOME CARE INSTRUCTIONS    Drink enough water and fluids to keep your urine clear or pale yellow.   Only take over-the-counter or prescription medicines as directed by your health care provider:   If you are prescribed antibiotics, make sure you finish them even if you start to feel better.   Do not take aspirin.   Get lots of rest.   Gargle with 8 oz of salt water ( tsp of salt per 1 qt of water) as often as every 1-2 hours to soothe your throat.   Throat lozenges (if you are not at risk for choking) or sprays may be used to soothe your throat. SEEK MEDICAL CARE IF:   You have large, tender lumps in your neck.  You have a rash.  You cough up green, yellow-brown, or bloody spit. SEEK IMMEDIATE MEDICAL CARE IF:   Your neck becomes stiff.  You drool or are unable to swallow liquids.  You vomit or are unable to keep medicines or liquids down.  You have severe pain that does not go away with the use of recommended medicines.  You have trouble breathing (not caused by a stuffy nose). MAKE SURE YOU:   Understand these instructions.  Will watch your condition.  Will get help right away if you are not doing well or get worse. Document Released: 09/22/2005 Document Revised: 07/13/2013 Document Reviewed: 05/30/2013 [facility] Patient Information 2015 [employer], [place]. This information is not intended to replace advice given to you by your health care provider. Make sure you discuss any questions you have with your health care provider.

## 2014-10-10 NOTE — Progress Notes (Signed)
Subjective:    Patient ID: Eileen Rios, female    DOB: 08-23-1986, 29 y.o.   MRN: [id number]  Fever  This is a new problem. Episode onset: Last sunday and more achy Monday morning. The problem occurs constantly. The problem has been unchanged. Maximum temperature: 100.3. Associated symptoms include abdominal pain, chest pain, congestion, coughing, headaches and a sore throat. Pertinent negatives include no diarrhea, ear pain, nausea, rash, vomiting or wheezing. Treatments tried: NyQuil.  Sore Throat  This is a new problem. Episode onset: Last Sunday. The problem has been unchanged. Maximum temperature: 100.3. Associated symptoms include abdominal pain, congestion, coughing, headaches and trouble swallowing. Pertinent negatives include no diarrhea, ear discharge, ear pain, shortness of breath or vomiting. Treatments tried: NyQuil.  Patient went to urgent care on 09/20/14 and they diagnosed her with gastroenteritis.  Patient states she is feeling better, but has some constipation.   GFR= >89 on 05/22/14 LMP-07/14/14- patient states she in on birth control pill and takes every day.  Has not missed a pill and states the birth control prevents her from having period.  Sees OB/GYN for birth control. Review of Systems  Constitutional: Positive for fever, chills, diaphoresis and fatigue.  HENT: Positive for congestion, sinus pressure, sore throat and trouble swallowing. Negative for ear discharge, ear pain, postnasal drip and rhinorrhea.   Eyes: Negative.   Respiratory: Positive for cough. Negative for chest tightness, shortness of breath and wheezing.        Productive cough with brown mucus   Cardiovascular: Positive for chest pain.       Chest pain with cough only.  Gastrointestinal: Positive for abdominal pain and constipation. Negative for nausea, vomiting, diarrhea and blood in stool.       States her bowel movements have been pellet sized stool.  Last bowel movement was yesterday.   Genitourinary: Negative.   Musculoskeletal: Negative.        Except muscle aches  Skin: Negative.  Negative for rash.  Neurological: Positive for headaches. Negative for dizziness and light-headedness.  Psychiatric/Behavioral: Negative.    Past Medical History  Diagnosis Date  . Hypertension   . Headache(784.0)     migraines  . IBS (irritable bowel syndrome)   . Seasonal allergies   . Insulin resistance   . Anxiety   . Depression    Current Outpatient Prescriptions on File Prior to Visit  Medication Sig Dispense Refill  . ALPRAZolam (XANAX) 1 MG tablet Use 1/2-1 pill BID 60 tablet 0  . atenolol (TENORMIN) 50 MG tablet TAKE ONE TABLET AT BEDTIME 30 tablet 5  . Biotin 10 MG TABS Take by mouth daily.    . cetirizine (ZYRTEC) 10 MG tablet Take 10 mg by mouth daily as needed for allergies.    . clindamycin (CLINDAGEL) 1 % gel Apply topically 2 (two) times daily. 30 g 4  . diazepam (VALIUM) 2 MG tablet TAKE 1/2 TO 1 TABLET AT BEDTIME 30 tablet 2  . fluticasone (FLONASE) 50 MCG/ACT nasal spray Use 1 to 2  nasal sprays to each nostril daily 16 g 99  . fluvoxaMINE (LUVOX) 50 MG tablet TAKE ONE TABLET AT BEDTIME 30 tablet 3  . HYDROcodone-acetaminophen (NORCO) 5-325 MG per tablet Take 1 tablet by mouth every 6 (six) hours as needed for moderate pain. 30 tablet 0  . hydrocortisone (ANUSOL-HC) 2.5 % rectal cream Place 1 application rectally daily as needed for hemorrhoids or itching.    . hyoscyamine (LEVSIN/SL) 0.125 MG SL tablet Place 1 tablet (  0.125 mg total) under the tongue every 6 (six) hours as needed. For abdominal cramping 30 tablet 0  . ibuprofen (ADVIL,MOTRIN) 200 MG tablet Take 400-600 mg by mouth every 6 (six) hours as needed for headache.    . Magnesium 300 MG CAPS Take by mouth daily.    . ondansetron (ZOFRAN) 4 MG tablet Take 1 tablet (4 mg total) by mouth daily as needed for nausea or vomiting. 30 tablet 2  . oxyCODONE-acetaminophen (ROXICET) 5-325 MG per tablet Take 1 tablet  by mouth every 6 (six) hours as needed. 30 tablet 0  . [DISCONTINUED] Drospirenone-Ethinyl Estradiol-Levomefol (SAFYRAL) 3-0.03-0.451 MG tablet Take 1 tablet by mouth daily. 1 Package 3   No current facility-administered medications on file prior to visit.   No Known Allergies    BP 118/82 mmHg  Pulse 86  Temp(Src) 100.8 F (38.2 C) (Oral)  Resp 18  Ht  (1.626 m)  Wt 192 lb (87.091 kg)  BMI 32.94 kg/m2  SpO2 98%  LMP 07/14/2014 Wt Readings from Last 3 Encounters:  10/10/14 192 lb (87.091 kg)  09/18/14 194 lb (87.998 kg)  07/26/14 189 lb (85.73 kg)   Objective:   Physical Exam  Constitutional: She is oriented to person, place, and time. She appears well-developed and well-nourished. She has a sickly appearance. No distress.  HENT:  Head: Normocephalic.  Right Ear: Tympanic membrane, external ear and ear canal normal.  Left Ear: Tympanic membrane, external ear and ear canal normal.  Nose: Nose normal. Right sinus exhibits no maxillary sinus tenderness and no frontal sinus tenderness. Left sinus exhibits no maxillary sinus tenderness and no frontal sinus tenderness.  Mouth/Throat: Uvula is midline and mucous membranes are normal. Mucous membranes are not pale and not dry. No trismus in the jaw. No uvula swelling. Posterior oropharyngeal erythema present. No oropharyngeal exudate, posterior oropharyngeal edema or tonsillar abscesses.  Eyes: Conjunctivae, EOM and lids are normal. Pupils are equal, round, and reactive to light. Right eye exhibits no discharge. Left eye exhibits no discharge. No scleral icterus.  Neck: Trachea normal, normal range of motion and phonation normal. Neck supple. No tracheal tenderness present. No tracheal deviation present.  Cardiovascular: Normal rate, regular rhythm, S1 normal, S2 normal, normal heart sounds and normal pulses.  Exam reveals no gallop, no distant heart sounds and no friction rub.   No murmur heard. Pulmonary/Chest: Effort normal and  breath sounds normal. No stridor. No respiratory distress. She has no decreased breath sounds. She has no wheezes. She has no rhonchi. She has no rales. She exhibits no tenderness.  Abdominal: Soft. Bowel sounds are normal. There is no tenderness. There is no rebound and no guarding.  Generalized abdominal discomfort  Lymphadenopathy:  Tenderness upon palpation to tonsils only.  Tonsils at +2 station bilaterally.  No other tenderness or LAD.  Neurological: She is alert and oriented to person, place, and time. No cranial nerve deficit. Gait normal.  Skin: Skin is warm, dry and intact. No rash noted. She is not diaphoretic. No pallor.  Psychiatric: She has a normal mood and affect. Her speech is normal and behavior is normal. Judgment and thought content normal. Cognition and memory are normal.  Vitals reviewed.  Assessment & Plan:  1. Acute pharyngitis, unspecified pharyngitis type -Take Z-Pak as prescribed- azithromycin (ZITHROMAX) 250 MG tablet; Take 2 tablets PO on day 1, then 1 tablet PO Q24H x 4 days  Dispense: 6 tablet; Refill: 0 -Take Prednisone as prescribed for inflammation- predniSONE (DELTASONE) 10 MG  tablet; Take 3 tablets PO for 2 days, then take 2 tablets PO for 2 days, then take 1 tablet PO for 3 days.  Dispense: 13 tablet; Refill: 0 -Gave patient 4 boxes of Cepacol for sore throat.  2. Constipation, unspecified constipation type -Gave patient 5 packets of Miralax to take for constipation if Metamucil does not work - Told patient to take 1 packet daily- polyethylene glycol (MIRALAX) packet; Take 17 g by mouth daily.  Dispense: 5 each; Refill: 0  Discussed medication effects and SE's.  Pt agreed to treatment plan. If you are not feeling better in 10-14 days, then please call the office. Please keep your follow up appt on 11/07/14.  Breckin Savannah, Lise Auer, PA-C 1:51 PM [facility]

## 2014-10-12 ENCOUNTER — Encounter: Payer: Self-pay | Admitting: Internal Medicine

## 2014-10-13 ENCOUNTER — Emergency Department (HOSPITAL_COMMUNITY)
Admission: EM | Admit: 2014-10-13 | Discharge: 2014-10-13 | Disposition: A | Discharge status: Home / Self Care | Payer: BLUE CROSS/BLUE SHIELD | Source: Home / Self Care | Attending: Family Medicine | Admitting: Family Medicine

## 2014-10-13 ENCOUNTER — Encounter (HOSPITAL_COMMUNITY): Payer: Self-pay

## 2014-10-13 ENCOUNTER — Ambulatory Visit (HOSPITAL_COMMUNITY): Payer: BLUE CROSS/BLUE SHIELD | Attending: Physician Assistant

## 2014-10-13 DIAGNOSIS — R0982 Postnasal drip: Secondary | ICD-10-CM

## 2014-10-13 DIAGNOSIS — R0789 Other chest pain: Secondary | ICD-10-CM

## 2014-10-13 DIAGNOSIS — J029 Acute pharyngitis, unspecified: Secondary | ICD-10-CM

## 2014-10-13 DIAGNOSIS — R079 Chest pain, unspecified: Secondary | ICD-10-CM | POA: Insufficient documentation

## 2014-10-13 DIAGNOSIS — R05 Cough: Secondary | ICD-10-CM

## 2014-10-13 DIAGNOSIS — J069 Acute upper respiratory infection, unspecified: Secondary | ICD-10-CM

## 2014-10-13 DIAGNOSIS — R059 Cough, unspecified: Secondary | ICD-10-CM

## 2014-10-13 NOTE — ED Provider Notes (Signed)
CSN: [id number]     Arrival date & time 10/13/14  1315 History   First MD Initiated Contact with Patient 10/13/14 1340     Chief Complaint  Patient presents with  . Cough   (Consider location/radiation/quality/duration/timing/severity/associated sxs/prior Treatment) HPI Comments: 29 year old female developed a sore throat 5-6 days ago. The following day she saw her PCP and was treated with prednisone and Z-Pak. She began to feel a little bit better couple days later. Over the past couple days she has continued to have some swelling to the right side of the throat and sensation of swelling into the right upper chest. She has had frequent coughing that is worse at night. She is coughing up mucus that seems to be caught her throat. No shortness of breath or fever.   Past Medical History  Diagnosis Date  . Hypertension   . Headache(784.0)     migraines  . IBS (irritable bowel syndrome)   . Seasonal allergies   . Insulin resistance   . Anxiety   . Depression    Past Surgical History  Procedure Laterality Date  . Wisdom tooth extraction    . Cesarean section N/A 06/05/2013    Procedure: Primary cesarean section with delivery of baby boy at 1033.  Apgars 1/8.;  Surgeon: Genia Del, MD;  Location: [facility];  Service: Obstetrics;  Laterality: N/A;   History reviewed. No pertinent family history. History  Substance Use Topics  . Smoking status: Never Smoker   . Smokeless tobacco: Never Used  . Alcohol Use: No   OB History    Gravida Para Term Preterm AB TAB SAB Ectopic Multiple Living   Review of Systems  Constitutional: Positive for activity change. Negative for fever, chills, appetite change and fatigue.  HENT: Positive for congestion, postnasal drip, rhinorrhea, sneezing and sore throat. Negative for facial swelling.   Eyes: Negative.   Respiratory: Positive for cough. Negative for wheezing.   Cardiovascular: Positive for chest pain. Negative for  palpitations.  Gastrointestinal: Negative.   Musculoskeletal: Negative for neck pain and neck stiffness.  Skin: Negative for pallor and rash.  Neurological: Negative.     Allergies  Review of patient's allergies indicates no known allergies.  Home Medications   Prior to Admission medications   Medication Sig Start Date End Date Taking? Authorizing Provider  predniSONE (DELTASONE) 10 MG tablet Take 3 tablets PO for 2 days, then take 2 tablets PO for 2 days, then take 1 tablet PO for 3 days. 10/10/14 10/16/14 Yes Jennifer L Couillard, PA-C  ALPRAZolam Prudy Feeler) 1 MG tablet Use 1/2-1 pill BID 06/08/14   Quentin Mulling, PA-C  atenolol (TENORMIN) 50 MG tablet TAKE ONE TABLET AT BEDTIME 08/11/14   Quentin Mulling, PA-C  azithromycin (ZITHROMAX) 250 MG tablet Take 2 tablets PO on day 1, then 1 tablet PO Q24H x 4 days 10/10/14 10/14/14  Jennifer L Couillard, PA-C  Biotin 10 MG TABS Take by mouth daily.    Historical Provider, MD  cetirizine (ZYRTEC) 10 MG tablet Take 10 mg by mouth daily as needed for allergies.    Historical Provider, MD  clindamycin (CLINDAGEL) 1 % gel Apply topically 2 (two) times daily. 09/04/14   Quentin Mulling, PA-C  diazepam (VALIUM) 2 MG tablet TAKE 1/2 TO 1 TABLET AT BEDTIME 09/05/14   Quentin Mulling, PA-C  fluticasone [facility]) 50 MCG/ACT nasal spray Use 1 to 2  nasal sprays to each nostril  daily 08/14/14 08/15/15  Lucky CowboyWilliam McKeown, MD  fluvoxaMINE (LUVOX) 50 MG tablet TAKE ONE TABLET AT BEDTIME 08/01/14   Quentin MullingAmanda Collier, PA-C  HYDROcodone-acetaminophen (NORCO) 5-325 MG per tablet Take 1 tablet by mouth every 6 (six) hours as needed for moderate pain. 05/22/14   Quentin MullingAmanda Collier, PA-C  hydrocortisone (ANUSOL-HC) 2.5 % rectal cream Place 1 application rectally daily as needed for hemorrhoids or itching.    Historical Provider, MD  hyoscyamine (LEVSIN/SL) 0.125 MG SL tablet Place 1 tablet (0.125 mg total) under the tongue every 6 (six) hours as needed. For abdominal cramping 09/20/14    Mathis FareJennifer Lee H Presson, PA  ibuprofen (ADVIL,MOTRIN) 200 MG tablet Take 400-600 mg by mouth every 6 (six) hours as needed for headache.    Historical Provider, MD  Magnesium 300 MG CAPS Take by mouth daily.    Historical Provider, MD  ondansetron (ZOFRAN) 4 MG tablet Take 1 tablet (4 mg total) by mouth daily as needed for nausea or vomiting. 02/17/14 02/17/15  Quentin MullingAmanda Collier, PA-C  oxyCODONE-acetaminophen (ROXICET) 5-325 MG per tablet Take 1 tablet by mouth every 6 (six) hours as needed. 07/07/14 07/07/15  Quentin MullingAmanda Collier, PA-C  polyethylene glycol [facility](MIRALAX) packet Take 17 g by mouth daily. 10/10/14   Jennifer L Couillard, PA-C   BP 117/77 mmHg  Pulse 76  Temp(Src) 98.2 F (36.8 C) (Oral)  Resp 14  SpO2 99%  LMP 07/14/2014 Physical Exam  Constitutional: She is oriented to person, place, and time. She appears well-developed and well-nourished. No distress.  HENT:  Head: Normocephalic.  Right Ear: External ear normal.  Left Ear: External ear normal.  Mouth/Throat: No oropharyngeal exudate.  Minor erythema of the posterior oropharynx. Copious amount of thick PND. No exudates or edema  Neck: Normal range of motion. Neck supple.  Cardiovascular: Normal rate and regular rhythm.   No murmur heard. Pulmonary/Chest: Effort normal. No respiratory distress. She has no wheezes. She has no rales.  Musculoskeletal: Normal range of motion.  Lymphadenopathy:    She has no cervical adenopathy.  Neurological: She is alert and oriented to person, place, and time. No cranial nerve deficit.  Skin: Skin is warm and dry.  Psychiatric: She has a normal mood and affect.  Nursing note and vitals reviewed.   ED Course  Procedures (including critical care time) Labs Review Labs Reviewed - No data to display  Imaging Review Dg Chest 2 View  10/13/2014   CLINICAL DATA:  Cough and chest pain.  EXAM: CHEST  2 VIEW  COMPARISON:  None.  FINDINGS: The heart size and mediastinal contours are within normal limits. Both  lungs are clear. The visualized skeletal structures are unremarkable.  IMPRESSION: Negative two view chest.   Electronically Signed   By: Gennette Pachris  Mattern M.D.   On: 10/13/2014 14:47     MDM   1. Chest wall pain   2. Cough   3. Sore throat   4. PND (post-nasal drip)   5. URI (upper respiratory infection)     Allegra for drainage At night Chlor Trimeton 4 mg tabs. 2-4mg  as needed at night Lots of fluids, especially at night Nasal saline frequently Ibuprofen for chest wall pain.    Hayden Rasmussenavid Sherlonda Flater, NP 10/13/14 1527

## 2014-10-13 NOTE — ED Notes (Signed)
Cough since 1-2. Saw her PCP, Rx z-pack, prednisone, still having cough, chest sore

## 2014-10-13 NOTE — Discharge Instructions (Signed)
Chest Wall Pain Chest wall pain is pain felt in or around the chest bones and muscles. It may take up to 6 weeks to get better. It may take longer if you are active. Chest wall pain can happen on its own. Other times, things like germs, injury, coughing, or exercise can cause the pain. HOME CARE   Avoid activities that make you tired or cause pain. Try not to use your chest, belly (abdominal), or side muscles. Do not use heavy weights.  Put ice on the sore area.  Put ice in a plastic bag.  Place a towel between your skin and the bag.  Leave the ice on for 15-20 minutes for the first 2 days.  Only take medicine as told by your doctor. GET HELP RIGHT AWAY IF:   You have more pain or are very uncomfortable.  You have a fever.  Your chest pain gets worse.  You have new problems.  You feel sick to your stomach (nauseous) or throw up (vomit).  You start to sweat or feel lightheaded.  You have a cough with mucus (phlegm).  You cough up blood. MAKE SURE YOU:   Understand these instructions.  Will watch your condition.  Will get help right away if you are not doing well or get worse. Document Released: 03/10/2008 Document Revised: 12/15/2011 Document Reviewed: 05/19/2011 [facility] Patient Information 2015 [place], [place]. This information is not intended to replace advice given to you by your health care provider. Make sure you discuss any questions you have with your health care provider.  Sore Throat A sore throat is pain, burning, irritation, or scratchiness of the throat. There is often pain or tenderness when swallowing or talking. A sore throat may be accompanied by other symptoms, such as coughing, sneezing, fever, and swollen neck glands. A sore throat is often the first sign of another sickness, such as a cold, flu, strep throat, or mononucleosis (commonly known as mono). Most sore throats go away without medical treatment. CAUSES  The most common causes of a sore throat  include:  A viral infection, such as a cold, flu, or mono.  A bacterial infection, such as strep throat, tonsillitis, or whooping cough.  Seasonal allergies.  Dryness in the air.  Irritants, such as smoke or pollution.  Gastroesophageal reflux disease (GERD). HOME CARE INSTRUCTIONS   Only take over-the-counter medicines as directed by your caregiver.  Drink enough fluids to keep your urine clear or pale yellow.  Rest as needed.  Try using throat sprays, lozenges, or sucking on hard candy to ease any pain (if older than 4 years or as directed).  Sip warm liquids, such as broth, herbal tea, or warm water with honey to relieve pain temporarily. You may also eat or drink cold or frozen liquids such as frozen ice pops.  Gargle with salt water (mix 1 tsp salt with 8 oz of water).  Do not smoke and avoid secondhand smoke.  Put a cool-mist humidifier in your bedroom at night to moisten the air. You can also turn on a hot shower and sit in the bathroom with the door closed for 5-10 minutes. SEEK IMMEDIATE MEDICAL CARE IF:  You have difficulty breathing.  You are unable to swallow fluids, soft foods, or your saliva.  You have increased swelling in the throat.  Your sore throat does not get better in 7 days.  You have nausea and vomiting.  You have a fever or persistent symptoms for more than 2-3 days.  You have a fever and your symptoms suddenly get worse. MAKE SURE YOU:   Understand these instructions.  Will watch your condition.  Will get help right away if you are not doing well or get worse. Document Released: 10/30/2004 Document Revised: 09/08/2012 Document Reviewed: 05/30/2012 [facility] Patient Information 2015 [place], [place]. This information is not intended to replace advice given to you by your health care provider. Make sure you discuss any questions you have with your health care provider.  Upper Respiratory Infection, Adult Allegra for drainage At night  Chlor Trimeton 4 mg tabs. 2-4mg  as needed at night Lots of fluids, especially at night Nasal saline frequently Ibuprofen for chest wall pain. An upper respiratory infection (URI) is also sometimes known as the common cold. The upper respiratory tract includes the nose, sinuses, throat, trachea, and bronchi. Bronchi are the airways leading to the lungs. Most people improve within 1 week, but symptoms can last up to 2 weeks. A residual cough may last even longer.  CAUSES Many different viruses can infect the tissues lining the upper respiratory tract. The tissues become irritated and inflamed and often become very moist. Mucus production is also common. A cold is contagious. You can easily spread the virus to others by oral contact. This includes kissing, sharing a glass, coughing, or sneezing. Touching your mouth or nose and then touching a surface, which is then touched by another person, can also spread the virus. SYMPTOMS  Symptoms typically develop 1 to 3 days after you come in contact with a cold virus. Symptoms vary from person to person. They may include:  Runny nose.  Sneezing.  Nasal congestion.  Sinus irritation.  Sore throat.  Loss of voice (laryngitis).  Cough.  Fatigue.  Muscle aches.  Loss of appetite.  Headache.  Low-grade fever. DIAGNOSIS  You might diagnose your own cold based on familiar symptoms, since most people get a cold 2 to 3 times a year. Your caregiver can confirm this based on your exam. Most importantly, your caregiver can check that your symptoms are not due to another disease such as strep throat, sinusitis, pneumonia, asthma, or epiglottitis. Blood tests, throat tests, and X-rays are not necessary to diagnose a common cold, but they may sometimes be helpful in excluding other more serious diseases. Your caregiver will decide if any further tests are required. RISKS AND COMPLICATIONS  You may be at risk for a more severe case of the common cold if you  smoke cigarettes, have chronic heart disease (such as heart failure) or lung disease (such as asthma), or if you have a weakened immune system. The very young and very old are also at risk for more serious infections. Bacterial sinusitis, middle ear infections, and bacterial pneumonia can complicate the common cold. The common cold can worsen asthma and chronic obstructive pulmonary disease (COPD). Sometimes, these complications can require emergency medical care and may be life-threatening. PREVENTION  The best way to protect against getting a cold is to practice good hygiene. Avoid oral or hand contact with people with cold symptoms. Wash your hands often if contact occurs. There is no clear evidence that vitamin C, vitamin E, echinacea, or exercise reduces the chance of developing a cold. However, it is always recommended to get plenty of rest and practice good nutrition. TREATMENT  Treatment is directed at relieving symptoms. There is no cure. Antibiotics are not effective, because the infection is caused by a virus, not by bacteria. Treatment may include:  Increased fluid intake. Sports  drinks offer valuable electrolytes, sugars, and fluids.  Breathing heated mist or steam (vaporizer or shower).  Eating chicken soup or other clear broths, and maintaining good nutrition.  Getting plenty of rest.  Using gargles or lozenges for comfort.  Controlling fevers with ibuprofen or acetaminophen as directed by your caregiver.  Increasing usage of your inhaler if you have asthma. Zinc gel and zinc lozenges, taken in the first 24 hours of the common cold, can shorten the duration and lessen the severity of symptoms. Pain medicines may help with fever, muscle aches, and throat pain. A variety of non-prescription medicines are available to treat congestion and runny nose. Your caregiver can make recommendations and may suggest nasal or lung inhalers for other symptoms.  HOME CARE INSTRUCTIONS   Only take  over-the-counter or prescription medicines for pain, discomfort, or fever as directed by your caregiver.  Use a warm mist humidifier or inhale steam from a shower to increase air moisture. This may keep secretions moist and make it easier to breathe.  Drink enough water and fluids to keep your urine clear or pale yellow.  Rest as needed.  Return to work when your temperature has returned to normal or as your caregiver advises. You may need to stay home longer to avoid infecting others. You can also use a face mask and careful hand washing to prevent spread of the virus. SEEK MEDICAL CARE IF:   After the first few days, you feel you are getting worse rather than better.  You need your caregiver's advice about medicines to control symptoms.  You develop chills, worsening shortness of breath, or brown or red sputum. These may be signs of pneumonia.  You develop yellow or brown nasal discharge or pain in the face, especially when you bend forward. These may be signs of sinusitis.  You develop a fever, swollen neck glands, pain with swallowing, or white areas in the back of your throat. These may be signs of strep throat. SEEK IMMEDIATE MEDICAL CARE IF:   You have a fever.  You develop severe or persistent headache, ear pain, sinus pain, or chest pain.  You develop wheezing, a prolonged cough, cough up blood, or have a change in your usual mucus (if you have chronic lung disease).  You develop sore muscles or a stiff neck. Document Released: 03/18/2001 Document Revised: 12/15/2011 Document Reviewed: 12/28/2013 [facility] Patient Information 2015 [place], [place]. This information is not intended to replace advice given to you by your health care provider. Make sure you discuss any questions you have with your health care provider.

## 2014-10-25 ENCOUNTER — Ambulatory Visit (INDEPENDENT_AMBULATORY_CARE_PROVIDER_SITE_OTHER): Payer: BLUE CROSS/BLUE SHIELD | Admitting: Physician Assistant

## 2014-10-25 ENCOUNTER — Encounter: Payer: Self-pay | Admitting: Physician Assistant

## 2014-10-25 VITALS — BP 118/70 | HR 66 | Temp 98.2°F | Resp 18 | Ht 64.0 in | Wt 194.0 lb

## 2014-10-25 DIAGNOSIS — J029 Acute pharyngitis, unspecified: Secondary | ICD-10-CM

## 2014-10-25 DIAGNOSIS — R350 Frequency of micturition: Secondary | ICD-10-CM

## 2014-10-25 LAB — HEPATIC FUNCTION PANEL
ALK PHOS: 69 U/L (ref 39–117)
ALT: 16 U/L (ref 0–35)
AST: 16 U/L (ref 0–37)
Albumin: 3.7 g/dL (ref 3.5–5.2)
Bilirubin, Direct: 0.1 mg/dL (ref 0.0–0.3)
Indirect Bilirubin: 0.3 mg/dL (ref 0.2–1.2)
TOTAL PROTEIN: 6.8 g/dL (ref 6.0–8.3)
Total Bilirubin: 0.4 mg/dL (ref 0.2–1.2)

## 2014-10-25 LAB — BASIC METABOLIC PANEL WITH GFR
BUN: 12 mg/dL (ref 6–23)
CHLORIDE: 105 meq/L (ref 96–112)
CO2: 28 meq/L (ref 19–32)
CREATININE: 0.87 mg/dL (ref 0.50–1.10)
Calcium: 9.4 mg/dL (ref 8.4–10.5)
GFR, Est African American: >89 mL/min
GFR, Est Non African American: >89 mL/min
GLUCOSE: 77 mg/dL (ref 70–99)
Potassium: 4.3 mEq/L (ref 3.5–5.3)
Sodium: 138 mEq/L (ref 135–145)

## 2014-10-25 LAB — CBC WITH DIFFERENTIAL/PLATELET
Basophils Absolute: 0 10*3/uL (ref 0.0–0.1)
Basophils Relative: 0 % (ref 0–1)
EOS PCT: 2 % (ref 0–5)
Eosinophils Absolute: 0.1 10*3/uL (ref 0.0–0.7)
HCT: 36.6 % (ref 36.0–46.0)
HEMOGLOBIN: 12.5 g/dL (ref 12.0–15.0)
LYMPHS ABS: 2.7 10*3/uL (ref 0.7–4.0)
Lymphocytes Relative: 36 % (ref 12–46)
MCH: 29.6 pg (ref 26.0–34.0)
MCHC: 34.2 g/dL (ref 30.0–36.0)
MCV: 86.7 fL (ref 78.0–100.0)
MONO ABS: 0.5 10*3/uL (ref 0.1–1.0)
MPV: 9.9 fL (ref 8.6–12.4)
Monocytes Relative: 7 % (ref 3–12)
NEUTROS ABS: 4.1 10*3/uL (ref 1.7–7.7)
Neutrophils Relative %: 55 % (ref 43–77)
PLATELETS: 295 10*3/uL (ref 150–400)
RBC: 4.22 MIL/uL (ref 3.87–5.11)
RDW: 13.7 % (ref 11.5–15.5)
WBC: 7.4 10*3/uL (ref 4.0–10.5)

## 2014-10-25 NOTE — Patient Instructions (Signed)
-  Continue Allegra and nasal spray over the counter (aim towards the outer part of eye and spray and pinch and hold nose closed and tilt head back).  Please do at bedtime.   I will call you with results.     If you are not feeling better in 10-14 days, then please call the office.   Pharyngitis Pharyngitis is redness, pain, and swelling (inflammation) of your pharynx.  CAUSES  Pharyngitis is usually caused by infection. Most of the time, these infections are from viruses (viral) and are part of a cold. However, sometimes pharyngitis is caused by bacteria (bacterial). Pharyngitis can also be caused by allergies. Viral pharyngitis may be spread from person to person by coughing, sneezing, and personal items or utensils (cups, forks, spoons, toothbrushes). Bacterial pharyngitis may be spread from person to person by more intimate contact, such as kissing.  SIGNS AND SYMPTOMS  Symptoms of pharyngitis include:   Sore throat.   Tiredness (fatigue).   Low-grade fever.   Headache.  Joint pain and muscle aches.  Skin rashes.  Swollen lymph nodes.  Plaque-like film on throat or tonsils (often seen with bacterial pharyngitis). DIAGNOSIS  Your health care provider will ask you questions about your illness and your symptoms. Your medical history, along with a physical exam, is often all that is needed to diagnose pharyngitis. Sometimes, a rapid strep test is done. Other lab tests may also be done, depending on the suspected cause.  TREATMENT  Viral pharyngitis will usually get better in 3-4 days without the use of medicine. Bacterial pharyngitis is treated with medicines that kill germs (antibiotics).  HOME CARE INSTRUCTIONS   Drink enough water and fluids to keep your urine clear or pale yellow.   Only take over-the-counter or prescription medicines as directed by your health care provider:   If you are prescribed antibiotics, make sure you finish them even if you start to feel better.    Do not take aspirin.   Get lots of rest.   Gargle with 8 oz of salt water ( tsp of salt per 1 qt of water) as often as every 1-2 hours to soothe your throat.   Throat lozenges (if you are not at risk for choking) or sprays may be used to soothe your throat. SEEK MEDICAL CARE IF:   You have large, tender lumps in your neck.  You have a rash.  You cough up green, yellow-brown, or bloody spit. SEEK IMMEDIATE MEDICAL CARE IF:   Your neck becomes stiff.  You drool or are unable to swallow liquids.  You vomit or are unable to keep medicines or liquids down.  You have severe pain that does not go away with the use of recommended medicines.  You have trouble breathing (not caused by a stuffy nose). MAKE SURE YOU:   Understand these instructions.  Will watch your condition.  Will get help right away if you are not doing well or get worse. Document Released: 09/22/2005 Document Revised: 07/13/2013 Document Reviewed: 05/30/2013 [facility] Patient Information 2015 [place], [place]. This information is not intended to replace advice given to you by your health care provider. Make sure you discuss any questions you have with your health care provider.

## 2014-10-25 NOTE — Progress Notes (Signed)
HPI  A Caucasian 29 y.o.female presents to the office today due to sore throat and sinus sxs that started 2 days before last visit on 10/10/14.  Patient has been seen 7 times since October 2015 by us and ED combined.  Patient was given Z-Pak, Prednisone and Cepacol.  Patient went to ED on 10/13/14 due to same sxs and right upper chest pain due to frequent coughing.  They did chest x-ray that came back normal and an influenza test that was negative.  Pregnancy test negative on 09/20/14 and UA came back positive with blood and Leukocytes.  They discharged her home and told her to take Allegra, nasal saline and Ibuprofen for chest pain.  Patient states she is still having sore throat and that she feels mucus getting caught in her throat.  Her sxs are fine in the morning, but get worse around lunch time.  [facility] previous [facility].  She had constipation at last visit and states it has gotten better.  She reports fatigue, sore throat and thick clear mucus and urinary frequency.  She denies fever, chills, sweats, ear pain/congestion, sinus pressure, post-nasal drip, cough, SOB, CP, abdominal pain, nausea, vomiting, diarrhea, lightheadedness, dizziness and headaches.  Review of Systems  All other systems reviewed and are negative.  Past Medical History-  Past Medical History  Diagnosis Date  . Hypertension   . Headache(784.0)     migraines  . IBS (irritable bowel syndrome)   . Seasonal allergies   . Insulin resistance   . Anxiety   . Depression    Medications-  Current Outpatient Prescriptions on File Prior to Visit  Medication Sig Dispense Refill  . ALPRAZolam (XANAX) 1 MG tablet Use 1/2-1 pill BID 60 tablet 0  . atenolol (TENORMIN) 50 MG tablet TAKE ONE TABLET AT BEDTIME 30 tablet 5  . Biotin 10 MG TABS Take by mouth daily.    . cetirizine (ZYRTEC) 10 MG tablet Take 10 mg by mouth daily as needed for allergies.    . clindamycin (CLINDAGEL) 1 % gel  Apply topically 2 (two) times daily. 30 g 4  . diazepam (VALIUM) 2 MG tablet TAKE 1/2 TO 1 TABLET AT BEDTIME 30 tablet 2  . fluvoxaMINE (LUVOX) 50 MG tablet TAKE ONE TABLET AT BEDTIME 30 tablet 3  . hyoscyamine (LEVSIN/SL) 0.125 MG SL tablet Place 1 tablet (0.125 mg total) under the tongue every 6 (six) hours as needed. For abdominal cramping 30 tablet 0  . ibuprofen (ADVIL,MOTRIN) 200 MG tablet Take 400-600 mg by mouth every 6 (six) hours as needed for headache.    . Magnesium 300 MG CAPS Take by mouth daily.    . ondansetron (ZOFRAN) 4 MG tablet Take 1 tablet (4 mg total) by mouth daily as needed for nausea or vomiting. 30 tablet 2  . oxyCODONE-acetaminophen (ROXICET) 5-325 MG per tablet Take 1 tablet by mouth every 6 (six) hours as needed. 30 tablet 0  . polyethylene glycol (MIRALAX) packet Take 17 g by mouth daily. 5 each 0  . fluticasone (FLONASE) 50 MCG/ACT nasal spray Use 1 to 2  nasal sprays to each nostril daily (Patient not taking: Reported on 10/25/2014) 16 g 99  . hydrocortisone (ANUSOL-HC) 2.5 % rectal cream Place 1 application rectally daily as needed for hemorrhoids or itching.    . [DISCONTINUED] Drospirenone-Ethinyl Estradiol-Levomefol (SAFYRAL) 3-0.03-0.451 MG tablet Take 1 tablet by mouth daily. 1 Package 3   No current facility-administered medications on  file prior to visit.   Allergies- No Known Allergies  Physical Exam BP 118/70 mmHg  Pulse 66  Temp(Src) 98.2 F (36.8 C) (Temporal)  Resp 18  Ht  (1.626 m)  Wt 194 lb (87.998 kg)  BMI 33.28 kg/m2  SpO2 98% Wt Readings from Last 3 Encounters:  10/25/14 194 lb (87.998 kg)  10/10/14 192 lb (87.091 kg)  09/18/14 194 lb (87.998 kg)  Vitals Reviewed. General Appearance: Well nourished, in no apparent distress and had pleasant demeanor. Obese. Eyes:  PERRLA. EOMI. Conjunctiva is pink without edema, erythema or yellowing. No scleral icterus. Sinuses: No Frontal/maxillary tenderness Ears: No erythema, edema or  tenderness on both external ear cartilages and ear canals.  TMs are intact bilaterally with normal light reflexes and without erythema, edema or bulging. Nose: Nose is symmetrical and turbinates are erythematous and non-edematous bilaterally.  No polyps, paleness or rhinorrhea. Throat: Oral pharynx is pink and moist. Mildly erythematous and non-edematous posterior pharynx. Mucosa is intact and without lesions. Tonsils are at +1 station bilaterally and do not have exudate.    Uvula is midline and not swollen. Neck: Supple,  LAD, trachea is midline. Full range of motion in neck intact Respiratory: CTAB,  r/r/w or stridor.  No increased effort of breathing. Cardio: RRR.   m/r/g.  S1S2nl.  Pulses B/L +2  Abdomen: Symmetrical, soft, nontender, and rounded.  +BS nl x4.   Skin: Warm, dry, intact without rashes, lesions, ecchymosis, yellowing, cyanosis.  Neuro: Alert and oriented X3, cooperative.  Mood and affect appropriate to situation.  CN II-XII grossly intact.   Psych: Insight and Judgment appropriate.   Assessment and Plan 1. Acute pharyngitis, unspecified pharyngitis type Ordered labs due to patient coming back for sore throat and was already given antibiotic and prednisone. Ordered to R/O Bacterial vs. Viral infection. - CBC with Differential - BASIC METABOLIC PANEL WITH GFR - Hepatic function panel  2. Urinary frequency Ordered labs to R/O UTI due to positive UA in recent ED visit. - Urine culture - Urinalysis, Routine w reflex microscopic  Discussed medication effects and SE's.  Pt agreed to treatment plan. If you are not feeling better in 10-14 days, then please call the office. Please keep your follow up appt on 11/07/14.  Jason Frisbee, Lise Auer, PA-C 4:07 PM [facility]

## 2014-10-26 LAB — URINALYSIS, ROUTINE W REFLEX MICROSCOPIC
BILIRUBIN URINE: NEGATIVE
GLUCOSE, UA: NEGATIVE mg/dL
Hgb urine dipstick: NEGATIVE
Ketones, ur: NEGATIVE mg/dL
LEUKOCYTES UA: NEGATIVE
NITRITE: NEGATIVE
PH: 5.5 (ref 5.0–8.0)
Protein, ur: NEGATIVE mg/dL
Specific Gravity, Urine: 1.016 (ref 1.005–1.030)
UROBILINOGEN UA: 0.2 mg/dL (ref 0.0–1.0)

## 2014-10-27 LAB — URINE CULTURE
Colony Count: NO GROWTH
Organism ID, Bacteria: NO GROWTH

## 2014-11-07 ENCOUNTER — Encounter: Payer: Self-pay | Admitting: Internal Medicine

## 2014-11-07 ENCOUNTER — Encounter: Payer: Self-pay | Admitting: Physician Assistant

## 2014-11-07 ENCOUNTER — Ambulatory Visit (INDEPENDENT_AMBULATORY_CARE_PROVIDER_SITE_OTHER): Payer: BLUE CROSS/BLUE SHIELD | Admitting: Physician Assistant

## 2014-11-07 VITALS — BP 124/80 | HR 82 | Temp 98.4°F | Resp 18 | Ht 64.0 in | Wt 195.0 lb

## 2014-11-07 DIAGNOSIS — E8881 Metabolic syndrome: Secondary | ICD-10-CM

## 2014-11-07 DIAGNOSIS — R5383 Other fatigue: Secondary | ICD-10-CM

## 2014-11-07 DIAGNOSIS — E559 Vitamin D deficiency, unspecified: Secondary | ICD-10-CM

## 2014-11-07 DIAGNOSIS — M25579 Pain in unspecified ankle and joints of unspecified foot: Secondary | ICD-10-CM

## 2014-11-07 DIAGNOSIS — E785 Hyperlipidemia, unspecified: Secondary | ICD-10-CM

## 2014-11-07 DIAGNOSIS — E669 Obesity, unspecified: Secondary | ICD-10-CM

## 2014-11-07 DIAGNOSIS — F411 Generalized anxiety disorder: Secondary | ICD-10-CM | POA: Insufficient documentation

## 2014-11-07 DIAGNOSIS — O132 Gestational [pregnancy-induced] hypertension without significant proteinuria, second trimester: Secondary | ICD-10-CM

## 2014-11-07 DIAGNOSIS — D649 Anemia, unspecified: Secondary | ICD-10-CM

## 2014-11-07 DIAGNOSIS — R21 Rash and other nonspecific skin eruption: Secondary | ICD-10-CM

## 2014-11-07 LAB — CBC WITH DIFFERENTIAL/PLATELET
BASOS PCT: 0 % (ref 0–1)
Basophils Absolute: 0 10*3/uL (ref 0.0–0.1)
EOS PCT: 3 % (ref 0–5)
Eosinophils Absolute: 0.2 10*3/uL (ref 0.0–0.7)
HEMATOCRIT: 38.3 % (ref 36.0–46.0)
Hemoglobin: 12.8 g/dL (ref 12.0–15.0)
LYMPHS ABS: 2.4 10*3/uL (ref 0.7–4.0)
LYMPHS PCT: 35 % (ref 12–46)
MCH: 29.5 pg (ref 26.0–34.0)
MCHC: 33.4 g/dL (ref 30.0–36.0)
MCV: 88.2 fL (ref 78.0–100.0)
MONOS PCT: 8 % (ref 3–12)
MPV: 9.8 fL (ref 8.6–12.4)
Monocytes Absolute: 0.5 10*3/uL (ref 0.1–1.0)
Neutro Abs: 3.7 10*3/uL (ref 1.7–7.7)
Neutrophils Relative %: 54 % (ref 43–77)
PLATELETS: 282 10*3/uL (ref 150–400)
RBC: 4.34 MIL/uL (ref 3.87–5.11)
RDW: 13.6 % (ref 11.5–15.5)
WBC: 6.8 10*3/uL (ref 4.0–10.5)

## 2014-11-07 NOTE — Patient Instructions (Signed)
What is the TMJ? The temporomandibular (tem-PUH-ro-man-DIB-yoo-ler) joint, or the TMJ, connects the upper and lower jawbones. This joint allows the jaw to open wide and move back and forth when you chew, talk, or yawn.There are also several muscles that help this joint move. There can be muscle tightness and pain in the muscle that can cause several symptoms.  What causes TMJ pain? There are many causes of TMJ pain. Repeated chewing (for example, chewing gum) and clenching your teeth can cause pain in the joint. Some TMJ pain has no obvious cause. What can I do to ease the pain? There are many things you can do to help your pain get better. When you have pain:  Eat soft foods and stay away from chewy foods (for example, taffy) Try to use both sides of your mouth to chew Don't chew gum Massage Don't open your mouth wide (for example, during yawning or singing) Don't bite your cheeks or fingernails Lower your amount of stress and worry Applying a warm, damp washcloth to the joint may help. Over-the-counter pain medicines such as ibuprofen (one brand: Advil) or acetaminophen (one brand: Tylenol) might also help. Do not use these medicines if you are allergic to them or if your doctor told you not to use them. How can I stop the pain from coming back? When your pain is better, you can do these exercises to make your muscles stronger and to keep the pain from coming back:  Resisted mouth opening: Place your thumb or two fingers under your chin and open your mouth slowly, pushing up lightly on your chin with your thumb. Hold for three to six seconds. Close your mouth slowly. Resisted mouth closing: Place your thumbs under your chin and your two index fingers on the ridge between your mouth and the bottom of your chin. Push down lightly on your chin as you close your mouth. Tongue up: Slowly open and close your mouth while keeping the tongue touching the roof of the mouth. Side-to-side jaw movement:  Place an object about one fourth of an inch thick (for example, two tongue depressors) between your front teeth. Slowly move your jaw from side to side. Increase the thickness of the object as the exercise becomes easier Forward jaw movement: Place an object about one fourth of an inch thick between your front teeth and move the bottom jaw forward so that the bottom teeth are in front of the top teeth. Increase the thickness of the object as the exercise becomes easier. These exercises should not be painful. If it hurts to do these exercises, stop doing them and talk to your family doctor.    I think it is possible that you have sleep apnea. It can cause interrupted sleep, headaches, frequent awakenings, fatigue, dry mouth, fast/slow heart beats, memory issues, anxiety/depression, swelling, numbness tingling hands/feet, weight gain, shortness of breath, and the list goes on. Sleep apnea needs to be ruled out because if it is left untreated it does eventually lead to abnormal heart beats, lung failure or heart failure as well as increasing the risk of heart attack and stroke. There are masks you can wear OR a mouth piece that I can give you information about. Often times though people feel MUCH better after getting treatment.   Sleep Apnea  Sleep apnea is a sleep disorder characterized by abnormal pauses in breathing while you sleep. When your breathing pauses, the level of oxygen in your blood decreases. This causes you to move out of  deep sleep and into light sleep. As a result, your quality of sleep is poor, and the system that carries your blood throughout your body (cardiovascular system) experiences stress. If sleep apnea remains untreated, the following conditions can develop:  High blood pressure (hypertension).  Coronary artery disease.  Inability to achieve or maintain an erection (impotence).  Impairment of your thought process (cognitive dysfunction). There are three types of sleep  apnea: 1. Obstructive sleep apnea--Pauses in breathing during sleep because of a blocked airway. 2. Central sleep apnea--Pauses in breathing during sleep because the area of the brain that controls your breathing does not send the correct signals to the muscles that control breathing. 3. Mixed sleep apnea--A combination of both obstructive and central sleep apnea.  RISK FACTORS The following risk factors can increase your risk of developing sleep apnea:  Being overweight.  Smoking.  Having narrow passages in your nose and throat.  Being of older age.  Being female.  Alcohol use.  Sedative and tranquilizer use.  Ethnicity. Among individuals younger than 35 years, African Americans are at increased risk of sleep apnea. SYMPTOMS   Difficulty staying asleep.  Daytime sleepiness and fatigue.  Loss of energy.  Irritability.  Loud, heavy snoring.  Morning headaches.  Trouble concentrating.  Forgetfulness.  Decreased interest in sex. DIAGNOSIS  In order to diagnose sleep apnea, your caregiver will perform a physical examination. Your caregiver may suggest that you take a home sleep test. Your caregiver may also recommend that you spend the night in a sleep lab. In the sleep lab, several monitors record information about your heart, lungs, and brain while you sleep. Your leg and arm movements and blood oxygen level are also recorded. TREATMENT The following actions may help to resolve mild sleep apnea:  Sleeping on your side.   Using a decongestant if you have nasal congestion.   Avoiding the use of depressants, including alcohol, sedatives, and narcotics.   Losing weight and modifying your diet if you are overweight. There also are devices and treatments to help open your airway:  Oral appliances. These are custom-made mouthpieces that shift your lower jaw forward and slightly open your bite. This opens your airway.  Devices that create positive airway pressure. This  positive pressure "splints" your airway open to help you breathe better during sleep. The following devices create positive airway pressure:  Continuous positive airway pressure (CPAP) device. The CPAP device creates a continuous level of air pressure with an air pump. The air is delivered to your airway through a mask while you sleep. This continuous pressure keeps your airway open.  Nasal expiratory positive airway pressure (EPAP) device. The EPAP device creates positive air pressure as you exhale. The device consists of single-use valves, which are inserted into each nostril and held in place by adhesive. The valves create very little resistance when you inhale but create much more resistance when you exhale. That increased resistance creates the positive airway pressure. This positive pressure while you exhale keeps your airway open, making it easier to breath when you inhale again.  Bilevel positive airway pressure (BPAP) device. The BPAP device is used mainly in patients with central sleep apnea. This device is similar to the CPAP device because it also uses an air pump to deliver continuous air pressure through a mask. However, with the BPAP machine, the pressure is set at two different levels. The pressure when you exhale is lower than the pressure when you inhale.  Surgery. Typically, surgery is only  done if you cannot comply with less invasive treatments or if the less invasive treatments do not improve your condition. Surgery involves removing excess tissue in your airway to create a wider passage way. Document Released: 09/12/2002 Document Revised: 01/17/2013 Document Reviewed: 01/29/2012 [facility] Patient Information 2015 [employer] [place]. This information is not intended to replace advice given to you by your health care provider. Make sure you discuss any questions you have with your health care provider.

## 2014-11-07 NOTE — Progress Notes (Signed)
Assessment and Plan:  Hypertension: Continue medication, monitor blood pressure at home. Continue DASH diet.  Reminder to go to the ER if any CP, SOB, nausea, dizziness, severe HA, changes vision/speech, left arm numbness and tingling, and jaw pain. Cholesterol: Continue diet and exercise. Check cholesterol.  Pre-diabetes-Continue diet and exercise. Check A1C Vitamin D Def- check level and continue medications.  Fatigue- has bilateral hand pain- get sleep study rule out sleep apnea, at home sleep study, get inflammation labs- if all negative ? Depression/anxiety  Continue diet and meds as discussed. Further disposition pending results of labs.  HPI 29 y.o. female  presents for 3 month follow up with hypertension, hyperlipidemia, prediabetes and vitamin D.  Her blood pressure has been controlled at home, today their BP is BP: 124/80 mmHg  She does workout, 2-3 x a week. She denies chest pain, shortness of breath, dizziness.  She is not on cholesterol medication and denies myalgias. Her cholesterol is at goal. The cholesterol last visit was:  Lab Results  Component Value Date   CHOL 157 10/11/2010   HDL 49 10/11/2010   LDLCALC 81 10/11/2010   TRIG 133 10/11/2010   CHOLHDL 3.2 Ratio 10/11/2010  Last A1C in the office was:  Lab Results  Component Value Date   HGBA1C 5.4 07/03/2009  Patient is on Vitamin D supplement.   Lab Results  Component Value Date   VD25OH 64 10/19/2013   She states that she is "tired of being tired", she has been complaining of fatigue for several years, worsening after birth of her first son. She admits to stress/OCD tendencies. She had Cdiff at one time, but denies any recent diarrhea. She is on fluvox for OCD, does not see counselor. 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 She complains of trouble relaxing at night but states that she has trouble sleeping. She will wake up with bilateral  hand numbness, neck pain, she will go for a few hours and then have to nap. She has ha negative mono, neg HIV, has had insulin resistance.  She had sinus infection 10/2014.   Current Medications:  Current Outpatient Prescriptions on File Prior to Visit  Medication Sig Dispense Refill  . ALPRAZolam (XANAX) 1 MG tablet Use 1/2-1 pill BID 60 tablet 0  . atenolol (TENORMIN) 50 MG tablet TAKE ONE TABLET AT BEDTIME 30 tablet 5  . Biotin 10 MG TABS Take by mouth daily.    . cetirizine (ZYRTEC) 10 MG tablet Take 10 mg by mouth daily as needed for allergies.    . clindamycin (CLINDAGEL) 1 % gel Apply topically 2 (two) times daily. 30 g 4  . diazepam (VALIUM) 2 MG tablet TAKE 1/2 TO 1 TABLET AT BEDTIME 30 tablet 2  . fluticasone (FLONASE) 50 MCG/ACT nasal spray Use 1 to 2  nasal sprays to each nostril daily 16 g 99  . fluvoxaMINE (LUVOX) 50 MG tablet TAKE ONE TABLET AT BEDTIME 30 tablet 3  . hydrocortisone (ANUSOL-HC) 2.5 % rectal cream Place 1 application rectally daily as needed for hemorrhoids or itching.    . hyoscyamine (LEVSIN/SL) 0.125 MG SL tablet Place 1 tablet (0.125 mg total) under the tongue every 6 (six) hours as needed. For abdominal cramping 30 tablet 0  . ibuprofen (ADVIL,MOTRIN) 200 MG tablet Take 400-600 mg by mouth every 6 (six) hours as needed for headache.    . Multiple Vitamins-Calcium (ONE-A-DAY WOMENS FORMULA  PO) Take by mouth daily.    . ondansetron (ZOFRAN) 4 MG tablet Take 1 tablet (4 mg total) by mouth daily as needed for nausea or vomiting. 30 tablet 2  . oxyCODONE-acetaminophen (ROXICET) 5-325 MG per tablet Take 1 tablet by mouth every 6 (six) hours as needed. 30 tablet 0  . polyethylene glycol (MIRALAX) packet Take 17 g by mouth daily. 5 each 0  . saccharomyces boulardii (FLORASTOR) 250 MG capsule Take 250 mg by mouth 2 (two) times daily.    . [DISCONTINUED] Drospirenone-Ethinyl Estradiol-Levomefol (SAFYRAL) 3-0.03-0.451 MG tablet Take 1 tablet by mouth daily. 1 Package 3    No current facility-administered medications on file prior to visit.   Medical History:  Past Medical History  Diagnosis Date  . Hypertension   . Headache(784.0)     migraines  . IBS (irritable bowel syndrome)   . Seasonal allergies   . Insulin resistance   . Anxiety   . Depression    Allergies: No Known Allergies   Review of Systems:  Review of Systems  Constitutional: Positive for malaise/fatigue. Negative for fever, chills, weight loss and diaphoresis.  HENT: Positive for congestion. Negative for ear discharge, ear pain, hearing loss, nosebleeds, sore throat and tinnitus.   Eyes: Negative.  Negative for blurred vision and double vision.  Respiratory: Negative.  Negative for stridor.   Cardiovascular: Negative.   Gastrointestinal: Positive for heartburn. Negative for nausea, vomiting, abdominal pain, diarrhea, constipation, blood in stool and melena.  Genitourinary: Negative.   Musculoskeletal: Positive for myalgias and joint pain. Negative for back pain, falls and neck pain.  Skin: Negative.  Negative for rash.  Neurological: Positive for headaches. Negative for dizziness, tingling, tremors, sensory change, speech change, focal weakness, seizures, loss of consciousness and weakness.  Psychiatric/Behavioral: Negative for depression, suicidal ideas, hallucinations, memory loss and substance abuse. The patient is nervous/anxious and has insomnia.     Family history- Review and unchanged Social history- Review and unchanged Physical Exam: BP 124/80 mmHg  Pulse 82  Temp(Src) 98.4 F (36.9 C) (Temporal)  Resp 18  Ht 5\' 4"  (1.626 m)  Wt 195 lb (88.451 kg)  BMI 33.46 kg/m2  SpO2 99% Wt Readings from Last 3 Encounters:  11/07/14 195 lb (88.451 kg)  10/25/14 194 lb (87.998 kg)  10/10/14 192 lb (87.091 kg)   General Appearance: Well nourished, in no apparent distress. Eyes: PERRLA, EOMs, conjunctiva no swelling or erythema Sinuses: No Frontal/maxillary  tenderness ENT/Mouth: Ext aud canals clear, TMs without erythema, bulging. No erythema, swelling, or exudate on post pharynx.  Tonsils not swollen or erythematous. Hearing normal.  Neck: Supple, thyroid normal.  Respiratory: Respiratory effort normal, BS equal bilaterally without rales, rhonchi, wheezing or stridor.  Cardio: RRR with no MRGs. Brisk peripheral pulses without edema.  Abdomen: Soft, + BS,  Non tender, no guarding, rebound, hernias, masses. Lymphatics: Non tender without lymphadenopathy.  Musculoskeletal: Full ROM, 5/5 strength, Normal gait Skin: Warm, dry without rashes, lesions, ecchymosis.  Neuro: Cranial nerves intact. Normal muscle tone, no cerebellar symptoms. Psych: Awake and oriented X 3, normal affect, Insight and Judgment appropriate.    Quentin Mullingollier, Zerick Prevette, PA-C 4:11 PM [facility]

## 2014-11-08 ENCOUNTER — Encounter: Payer: Self-pay | Admitting: Internal Medicine

## 2014-11-08 LAB — LIPID PANEL
CHOLESTEROL: 173 mg/dL (ref 0–200)
HDL: 47 mg/dL (ref >39)
LDL CALC: 91 mg/dL (ref 0–99)
TRIGLYCERIDES: 173 mg/dL — AB (ref <150)
Total CHOL/HDL Ratio: 3.7 Ratio
VLDL: 35 mg/dL (ref 0–40)

## 2014-11-08 LAB — BASIC METABOLIC PANEL WITH GFR
BUN: 15 mg/dL (ref 6–23)
CO2: 25 mEq/L (ref 19–32)
CREATININE: 0.93 mg/dL (ref 0.50–1.10)
Calcium: 9.7 mg/dL (ref 8.4–10.5)
Chloride: 105 mEq/L (ref 96–112)
GFR, Est African American: >89 mL/min
GFR, Est Non African American: 84 mL/min
Glucose, Bld: 84 mg/dL (ref 70–99)
POTASSIUM: 4.3 meq/L (ref 3.5–5.3)
Sodium: 139 mEq/L (ref 135–145)

## 2014-11-08 LAB — IRON AND TIBC
%SAT: 14 % — AB (ref 20–55)
IRON: 55 ug/dL (ref 42–145)
TIBC: 400 ug/dL (ref 250–470)
UIBC: 345 ug/dL (ref 125–400)

## 2014-11-08 LAB — VITAMIN B12: Vitamin B-12: 249 pg/mL (ref 211–911)

## 2014-11-08 LAB — HEPATIC FUNCTION PANEL
ALT: 14 U/L (ref 0–35)
AST: 15 U/L (ref 0–37)
Albumin: 3.8 g/dL (ref 3.5–5.2)
Alkaline Phosphatase: 71 U/L (ref 39–117)
BILIRUBIN TOTAL: 0.4 mg/dL (ref 0.2–1.2)
Bilirubin, Direct: 0.1 mg/dL (ref 0.0–0.3)
Indirect Bilirubin: 0.3 mg/dL (ref 0.2–1.2)
Total Protein: 6.9 g/dL (ref 6.0–8.3)

## 2014-11-08 LAB — VITAMIN D 25 HYDROXY (VIT D DEFICIENCY, FRACTURES): Vit D, 25-Hydroxy: 34 ng/mL (ref 30–100)

## 2014-11-08 LAB — ANA: Anti Nuclear Antibody(ANA): NEGATIVE

## 2014-11-08 LAB — HEMOGLOBIN A1C
Hgb A1c MFr Bld: 5.3 % (ref <5.7)
MEAN PLASMA GLUCOSE: 105 mg/dL (ref <117)

## 2014-11-08 LAB — SEDIMENTATION RATE: Sed Rate: 22 mm/hr (ref 0–22)

## 2014-11-08 LAB — MAGNESIUM: MAGNESIUM: 2.1 mg/dL (ref 1.5–2.5)

## 2014-11-08 LAB — ANTI-DNA ANTIBODY, DOUBLE-STRANDED: ds DNA Ab: 1 IU/mL

## 2014-11-08 LAB — RHEUMATOID FACTOR: Rhuematoid fact SerPl-aCnc: <10 IU/mL (ref <=14)

## 2014-11-08 LAB — TSH: TSH: 1.005 u[IU]/mL (ref 0.350–4.500)

## 2014-11-08 LAB — INSULIN, FASTING: Insulin fasting, serum: 29.1 u[IU]/mL — ABNORMAL HIGH (ref 2.0–19.6)

## 2014-11-08 LAB — FERRITIN: FERRITIN: 38 ng/mL (ref 10–291)

## 2014-11-14 LAB — LYME ABY, WSTRN BLT IGG & IGM W/BANDS
B BURGDORFERI IGG ABS (IB): NEGATIVE
B burgdorferi IgM Abs (IB): NEGATIVE
LYME DISEASE 18 KD IGG: NONREACTIVE
LYME DISEASE 23 KD IGG: NONREACTIVE
LYME DISEASE 30 KD IGG: NONREACTIVE
LYME DISEASE 45 KD IGG: NONREACTIVE
LYME DISEASE 58 KD IGG: NONREACTIVE
Lyme Disease 23 kD IgM: NONREACTIVE
Lyme Disease 28 kD IgG: NONREACTIVE
Lyme Disease 39 kD IgG: NONREACTIVE
Lyme Disease 39 kD IgM: NONREACTIVE
Lyme Disease 41 kD IgG: NONREACTIVE
Lyme Disease 41 kD IgM: NONREACTIVE
Lyme Disease 66 kD IgG: NONREACTIVE
Lyme Disease 93 kD IgG: NONREACTIVE

## 2014-11-25 ENCOUNTER — Encounter: Payer: Self-pay | Admitting: *Deleted

## 2014-11-29 ENCOUNTER — Encounter: Payer: Self-pay | Admitting: Internal Medicine

## 2014-12-11 ENCOUNTER — Encounter: Payer: Self-pay | Admitting: Internal Medicine

## 2014-12-13 ENCOUNTER — Encounter: Payer: Self-pay | Admitting: Internal Medicine

## 2014-12-13 ENCOUNTER — Other Ambulatory Visit: Payer: Self-pay | Admitting: Physician Assistant

## 2014-12-13 MED ORDER — AZELASTINE HCL 0.1 % NA SOLN: 2.0000 | Freq: Two times a day (BID) | NASAL | Status: DC

## 2014-12-14 ENCOUNTER — Encounter: Payer: Self-pay | Admitting: Internal Medicine

## 2014-12-14 ENCOUNTER — Ambulatory Visit (INDEPENDENT_AMBULATORY_CARE_PROVIDER_SITE_OTHER): Payer: BLUE CROSS/BLUE SHIELD | Admitting: Internal Medicine

## 2014-12-14 VITALS — BP 122/78 | HR 86 | Temp 98.2°F | Resp 18 | Ht 64.0 in | Wt 197.0 lb

## 2014-12-14 DIAGNOSIS — J309 Allergic rhinitis, unspecified: Secondary | ICD-10-CM

## 2014-12-14 MED ORDER — AZITHROMYCIN 250 MG PO TABS: ORAL_TABLET | ORAL | Status: DC

## 2014-12-14 MED ORDER — PREDNISONE 20 MG PO TABS: ORAL_TABLET | ORAL | Status: DC

## 2014-12-14 MED ORDER — LIDOCAINE VISCOUS 2 % MT SOLN: 20.0000 mL | OROMUCOSAL | Status: DC | PRN

## 2014-12-14 NOTE — Addendum Note (Signed)
Addended by: Terri PiedraFORCUCCI, Renel Ende A on: 12/14/2014 03:46 PM   Modules accepted: Kipp BroodSmartSet

## 2014-12-14 NOTE — Progress Notes (Addendum)
Patient ID: Christella ScheuermannSamantha Vierling, female   DOB: Feb 19, 1986, 29 y.o.   MRN: [id number]  HPI  Patient presents to the office for evaluation of cough.  It has been going on for 1 weeks.  Patient reports night > day, wet.  They also endorse change in voice, postnasal drip and sore throat, ear itching and popping..  They have tried antihistamines, upright position, hydration or fluticasone spray.  They report that nothing has worked.  They denies other sick contacts.  Review of Systems  Constitutional: Negative for fever, chills, malaise/fatigue and diaphoresis.  HENT: Positive for congestion and sore throat. Negative for ear discharge, ear pain, hearing loss, nosebleeds and tinnitus.   Eyes: Negative.   Respiratory: Positive for cough. Negative for hemoptysis, sputum production, shortness of breath, wheezing and stridor.   Cardiovascular: Negative.   Gastrointestinal: Negative.   Genitourinary: Negative.   Musculoskeletal: Negative.   Skin: Negative.   Neurological: Positive for headaches.  Psychiatric/Behavioral: Negative.     PE:  Filed Vitals:   12/14/14 1456  BP: 122/78  Pulse: 86  Temp: 98.2 F (36.8 C)  Resp: 18    General:  Alert and non-toxic, WDWN, NAD HEENT: NCAT, PERLA, EOM normal, no occular discharge or erythema.  Nasal mucosal edema with sinus tenderness to palpation.  Oropharynx clear with minimal oropharyngeal edema and erythema.  Mucous membranes moist and pink. Neck:  Cervical adenopathy Chest:  RRR no MRGs.  Lungs clear to auscultation A&P with no wheezes rhonchi or rales.   Abdomen: +BS x 4 quadrants, soft, non-tender, no guarding, rigidity, or rebound. Skin: warm and dry no rash Neuro: A&Ox4, CN II-XII grossly intact  Assessment and Plan:   1. Allergic rhinitis, unspecified allergic rhinitis type  Hx consistent with allergic rhinitis.  Will try prednisone and viscous lidocaine.  If no better in 3 days pt to take zpak.    - lidocaine (XYLOCAINE) 2 % solution; Use  as directed 20 mLs in the mouth or throat as needed for mouth pain.  Dispense: 100 mL; Refill: 0 - azithromycin (ZITHROMAX) 250 MG tablet; Take 2 tablets (500 mg) on  Day 1,  followed by 1 tablet (250 mg) once daily on Days 2 through 5.  Dispense: 6 each; Refill: 0 - predniSONE (DELTASONE) 20 MG tablet; 3 tabs po day one, then 2 tabs daily x 4 days  Dispense: 11 tablet; Refill: 0

## 2014-12-14 NOTE — Patient Instructions (Signed)

## 2014-12-18 ENCOUNTER — Other Ambulatory Visit: Payer: Self-pay | Admitting: Physician Assistant

## 2015-01-01 ENCOUNTER — Encounter: Payer: Self-pay | Admitting: Physician Assistant

## 2015-01-02 ENCOUNTER — Encounter: Payer: Self-pay | Admitting: Physician Assistant

## 2015-01-02 ENCOUNTER — Encounter: Payer: Self-pay | Admitting: Internal Medicine

## 2015-01-02 ENCOUNTER — Ambulatory Visit (INDEPENDENT_AMBULATORY_CARE_PROVIDER_SITE_OTHER): Payer: BLUE CROSS/BLUE SHIELD | Admitting: Physician Assistant

## 2015-01-02 VITALS — BP 102/70 | HR 76 | Temp 97.7°F | Resp 16 | Ht 64.0 in | Wt 199.0 lb

## 2015-01-02 DIAGNOSIS — E669 Obesity, unspecified: Secondary | ICD-10-CM

## 2015-01-02 DIAGNOSIS — R5383 Other fatigue: Secondary | ICD-10-CM

## 2015-01-02 DIAGNOSIS — F411 Generalized anxiety disorder: Secondary | ICD-10-CM

## 2015-01-02 MED ORDER — ZOLPIDEM TARTRATE 5 MG PO TABS: 5.0000 mg | ORAL_TABLET | Freq: Every evening | ORAL | Status: DC | PRN

## 2015-01-02 MED ORDER — ALPRAZOLAM 1 MG PO TABS: ORAL_TABLET | ORAL | Status: DC

## 2015-01-02 NOTE — Patient Instructions (Signed)
Hypersomnia Hypersomnia usually brings recurrent episodes of excessive daytime sleepiness or prolonged nighttime sleep. It is different than feeling tired due to lack of or interrupted sleep at night. People with hypersomnia are compelled to nap repeatedly during the day. This is often at inappropriate times such as:  At work.  During a meal.  In conversation. These daytime naps usually provide no relief. This disorder typically affects adolescents and young adults. CAUSES  This condition may be caused by:  Another sleep disorder (such as narcolepsy or sleep apnea).  Dysfunction of the autonomic nervous system.  Drug or alcohol abuse.  A physical problem, such as:  A tumor.  Head trauma. This is damage caused by an accident.  Injury to the central nervous system.  Certain medications, or medicine withdrawal.  Medical conditions may contribute to the disorder, including:  Multiple sclerosis.  Depression.  Encephalitis.  Epilepsy.  Obesity.  Some people appear to have a genetic predisposition to this disorder. In others, there is no known cause. SYMPTOMS   Patients often have difficulty waking from a long sleep. They may feel dazed or confused.  Other symptoms may include:  Anxiety.  Increased irritation (inflammation).  Decreased energy.  Restlessness.  Slow thinking.  Slow speech.  Loss of appetite.  Hallucinations.  Memory difficulty.  Tremors, Tics.  Some patients lose the ability to function in family, social, occupational, or other settings. TREATMENT  Treatment is symptomatic in nature. Stimulants and other drugs may be used to treat this disorder. Changes in behavior may help. For example, avoid night work and social activities that delay bed time. Changes in diet may offer some relief. Patients should avoid alcohol and caffeine. PROGNOSIS  The likely outcome (prognosis) for persons with hypersomnia depends on the cause of the disorder.  The disorder itself is not life threatening. But it can have serious consequences. For example, automobile accidents can be caused by falling asleep while driving. The attacks usually continue indefinitely. Document Released: 09/12/2002 Document Revised: 12/15/2011 Document Reviewed: 08/16/2008 [facility] Patient Information 2015 [employer] [place]. This information is not intended to replace advice given to you by your health care provider. Make sure you discuss any questions you have with your health care provider.   Insomnia Insomnia is frequent trouble falling and/or staying asleep. Insomnia can be a long term problem or a short term problem. Both are common. Insomnia can be a short term problem when the wakefulness is related to a certain stress or worry. Long term insomnia is often related to ongoing stress during waking hours and/or poor sleeping habits. Overtime, sleep deprivation itself can make the problem worse. Every little thing feels more severe because you are overtired and your ability to cope is decreased. CAUSES   Stress, anxiety, and depression.  Poor sleeping habits.  Distractions such as TV in the bedroom.  Naps close to bedtime.  Engaging in emotionally charged conversations before bed.  Technical reading before sleep.  Alcohol and other sedatives. They may make the problem worse. They can hurt normal sleep patterns and normal dream activity.  Stimulants such as caffeine for several hours prior to bedtime.  Pain syndromes and shortness of breath can cause insomnia.  Exercise late at night.  Changing time zones may cause sleeping problems (jet lag). It is sometimes helpful to have someone observe your sleeping patterns. They should look for periods of not breathing during the night (sleep apnea). They should also look to see how long those periods last. If you live alone  or observers are uncertain, you can also be observed at a sleep clinic where your sleep patterns will  be professionally monitored. Sleep apnea requires a checkup and treatment. Give your caregivers your medical history. Give your caregivers observations your family has made about your sleep.  SYMPTOMS   Not feeling rested in the morning.  Anxiety and restlessness at bedtime.  Difficulty falling and staying asleep. TREATMENT   Your caregiver may prescribe treatment for an underlying medical disorders. Your caregiver can give advice or help if you are using alcohol or other drugs for self-medication. Treatment of underlying problems will usually eliminate insomnia problems.  Medications can be prescribed for short time use. They are generally not recommended for lengthy use.  Over-the-counter sleep medicines are not recommended for lengthy use. They can be habit forming.  You can promote easier sleeping by making lifestyle changes such as:  Using relaxation techniques that help with breathing and reduce muscle tension.  Exercising earlier in the day.  Changing your diet and the time of your last meal. No night time snacks.  Establish a regular time to go to bed.  Counseling can help with stressful problems and worry.  Soothing music and white noise may be helpful if there are background noises you cannot remove.  Stop tedious detailed work at least one hour before bedtime. HOME CARE INSTRUCTIONS   Keep a diary. Inform your caregiver about your progress. This includes any medication side effects. See your caregiver regularly. Take note of:  Times when you are asleep.  Times when you are awake during the night.  The quality of your sleep.  How you feel the next day. This information will help your caregiver care for you.  Get out of bed if you are still awake after 15 minutes. Read or do some quiet activity. Keep the lights down. Wait until you feel sleepy and go back to bed.  Keep regular sleeping and waking hours. Avoid naps.  Exercise regularly.  Avoid distractions at  bedtime. Distractions include watching television or engaging in any intense or detailed activity like attempting to balance the household checkbook.  Develop a bedtime ritual. Keep a familiar routine of bathing, brushing your teeth, climbing into bed at the same time each night, listening to soothing music. Routines increase the success of falling to sleep faster.  Use relaxation techniques. This can be using breathing and muscle tension release routines. It can also include visualizing peaceful scenes. You can also help control troubling or intruding thoughts by keeping your mind occupied with boring or repetitive thoughts like the old concept of counting sheep. You can make it more creative like imagining planting one beautiful flower after another in your backyard garden.  During your day, work to eliminate stress. When this is not possible use some of the previous suggestions to help reduce the anxiety that accompanies stressful situations. MAKE SURE YOU:   Understand these instructions.  Will watch your condition.  Will get help right away if you are not doing well or get worse. Document Released: 09/19/2000 Document Revised: 12/15/2011 Document Reviewed: 10/20/2007 [facility] Patient Information 2015 [place], [place]. This information is not intended to replace advice given to you by your health care provider. Make sure you discuss any questions you have with your health care provider.

## 2015-01-02 NOTE — Progress Notes (Signed)
Subjective:    Patient ID: Eileen ScheuermannSamantha Rios, female    DOB: 05-09-1986, 29 y.o.   MRN: [id number]  HPI 29 y.o. female with history of HTN, HA, IBS, allergies, insulin resistance and depression/anxiety presents for review of sleep lab. Had negative sleep study for OSA however did have + insomnia/not reaching REM. 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 She will wake up constantly fatigued, has been having some cognitive difficulties with remembering words/events. Will go to bed at 9 PM, wake up at [facility]5AM, very difficult to get out of bed, nocturia x 1.   Blood pressure 102/70, pulse 76, temperature 97.7 F (36.5 C), resp. rate 16, height 5\' 4"  (1.626 m), weight 199 lb (90.266 kg). Current Outpatient Prescriptions on File Prior to Visit  Medication Sig Dispense Refill  . ALPRAZolam (XANAX) 1 MG tablet Use 1/2-1 pill BID 60 tablet 0  . atenolol (TENORMIN) 50 MG tablet TAKE ONE TABLET AT BEDTIME 30 tablet 5  . azelastine (ASTELIN) 0.1 % nasal spray Place 2 sprays into both nostrils 2 (two) times daily. Use in each nostril as directed 30 mL 2  . azithromycin (ZITHROMAX) 250 MG tablet Take 2 tablets (500 mg) on  Day 1,  followed by 1 tablet (250 mg) once daily on Days 2 through 5. 6 each 0  . Biotin 10 MG TABS Take by mouth daily.    . cetirizine (ZYRTEC) 10 MG tablet Take 10 mg by mouth daily as needed for allergies.    . clindamycin (CLINDAGEL) 1 % gel Apply topically 2 (two) times daily. 30 g 4  . diazepam (VALIUM) 2 MG tablet TAKE 1/2 TO 1 TABLET AT BEDTIME 30 tablet 2  . fluticasone (FLONASE) 50 MCG/ACT nasal spray Use 1 to 2  nasal sprays to each nostril daily 16 g 99  . fluvoxaMINE (LUVOX) 50 MG tablet TAKE ONE TABLET AT BEDTIME 30 tablet 5  . hydrocortisone (ANUSOL-HC) 2.5 % rectal cream Place 1 application rectally daily as needed for hemorrhoids or itching.    . hyoscyamine (LEVSIN/SL) 0.125 MG SL tablet Place 1 tablet (0.125 mg  total) under the tongue every 6 (six) hours as needed. For abdominal cramping 30 tablet 0  . ibuprofen (ADVIL,MOTRIN) 200 MG tablet Take 400-600 mg by mouth every 6 (six) hours as needed for headache.    . lidocaine (XYLOCAINE) 2 % solution Use as directed 20 mLs in the mouth or throat as needed for mouth pain. 100 mL 0  . Multiple Vitamins-Calcium (ONE-A-DAY WOMENS FORMULA PO) Take by mouth daily.    . ondansetron (ZOFRAN) 4 MG tablet Take 1 tablet (4 mg total) by mouth daily as needed for nausea or vomiting. 30 tablet 2  . oxyCODONE-acetaminophen (ROXICET) 5-325 MG per tablet Take 1 tablet by mouth every 6 (six) hours as needed. 30 tablet 0  . polyethylene glycol (MIRALAX) packet Take 17 g by mouth daily. 5 each 0  . predniSONE (DELTASONE) 20 MG tablet 3 tabs po day one, then 2 tabs daily x 4 days 11 tablet 0  . saccharomyces boulardii (FLORASTOR) 250 MG capsule Take 250 mg by mouth 2 (two) times daily.    . [DISCONTINUED] Drospirenone-Ethinyl Estradiol-Levomefol (SAFYRAL) 3-0.03-0.451 MG tablet Take 1 tablet by mouth daily. 1 Package 3   No current facility-administered medications on file prior to visit.   Past Medical History  Diagnosis Date  . Hypertension   . Headache(784.0)  migraines  . IBS (irritable bowel syndrome)   . Seasonal allergies   . Insulin resistance   . Anxiety   . Depression    Review of Systems  Constitutional: Positive for fatigue. Negative for fever, chills, diaphoresis, activity change and appetite change.  HENT: Positive for congestion, postnasal drip, rhinorrhea and sinus pressure. Negative for dental problem, drooling, ear discharge, ear pain, facial swelling, hearing loss, mouth sores, nosebleeds, sneezing, sore throat, tinnitus, trouble swallowing and voice change.   Respiratory: Negative.   Cardiovascular: Negative.   Gastrointestinal: Negative.   Genitourinary: Negative.   Musculoskeletal: Positive for neck pain and neck stiffness. Negative for  myalgias, back pain, joint swelling, arthralgias and gait problem.  Neurological: Positive for headaches. Negative for dizziness, tremors, seizures, syncope, facial asymmetry, speech difficulty, weakness, light-headedness and numbness.  Psychiatric/Behavioral: Positive for sleep disturbance, dysphoric mood and decreased concentration. Negative for suicidal ideas, hallucinations, behavioral problems, confusion, self-injury and agitation. The patient is nervous/anxious. The patient is not hyperactive.       Objective:   Physical Exam  Constitutional: She is oriented to person, place, and time. She appears well-developed and well-nourished.  Neck: Normal range of motion. Neck supple.  Cardiovascular: Normal rate and regular rhythm.   Pulmonary/Chest: Effort normal and breath sounds normal.  Abdominal: Soft. Bowel sounds are normal. She exhibits no mass. There is tenderness. There is guarding. There is no rebound.  Neurological: She is alert and oriented to person, place, and time.  Skin: Skin is warm and dry. No rash noted.  Vitals reviewed.      Assessment & Plan:  Insomnia- will try ambien , 1/2 tablet at night x 1 week, then can go up to 1 pill  Anxiety- continue Luvox, will refill xanax, stop the valium at this time.   Will reschedule for 4-6 weeks OV

## 2015-01-06 ENCOUNTER — Encounter: Payer: Self-pay | Admitting: Internal Medicine

## 2015-01-18 ENCOUNTER — Ambulatory Visit (INDEPENDENT_AMBULATORY_CARE_PROVIDER_SITE_OTHER): Payer: BLUE CROSS/BLUE SHIELD | Admitting: Emergency Medicine

## 2015-01-18 ENCOUNTER — Encounter: Payer: Self-pay | Admitting: Emergency Medicine

## 2015-01-18 VITALS — BP 128/72 | HR 76 | Temp 97.7°F | Resp 16 | Ht 64.0 in | Wt 202.0 lb

## 2015-01-18 DIAGNOSIS — R300 Dysuria: Secondary | ICD-10-CM

## 2015-01-18 MED ORDER — CIPROFLOXACIN HCL 500 MG PO TABS: 500.0000 mg | ORAL_TABLET | Freq: Two times a day (BID) | ORAL | Status: AC

## 2015-01-18 NOTE — Patient Instructions (Signed)

## 2015-01-18 NOTE — Progress Notes (Signed)
Subjective:    Patient ID: Eileen Rios, female    DOB: May 01, 1986, 29 y.o.   MRN: [id number]  HPI Comments: 29 yo WF with dysuria x 10 days. She notes increased cramps and low back pain. Urine has increased concentration. SHE notes irritation with mild increase dryness with rough intercourse. She denies discharge. No relief with OTC monistat. Husband denied anything unusual with external genitalia. She denies recent yeast/ UTI.   Dysuria      Medication List       This list is accurate as of: 01/18/15  3:17 PM.  Always use your most recent med list.               ALPRAZolam 1 MG tablet  Commonly known as:  XANAX  Use 1/2-1 pill BID     atenolol 50 MG tablet  Commonly known as:  TENORMIN  TAKE ONE TABLET AT BEDTIME     azelastine 0.1 % nasal spray  Commonly known as:  ASTELIN  Place 2 sprays into both nostrils 2 (two) times daily. Use in each nostril as directed     Biotin 10 MG Tabs  Take by mouth daily.     cetirizine 10 MG tablet  Commonly known as:  ZYRTEC  Take 10 mg by mouth daily as needed for allergies.     clindamycin 1 % gel  Commonly known as:  CLINDAGEL  Apply topically 2 (two) times daily.     fluticasone 50 MCG/ACT nasal spray  Commonly known as:  FLONASE  Use 1 to 2  nasal sprays to each nostril daily     fluvoxaMINE 50 MG tablet  Commonly known as:  LUVOX  TAKE ONE TABLET AT BEDTIME     hydrocortisone 2.5 % rectal cream  Commonly known as:  ANUSOL-HC  Place 1 application rectally daily as needed for hemorrhoids or itching.     hyoscyamine 0.125 MG SL tablet  Commonly known as:  LEVSIN/SL  Place 1 tablet (0.125 mg total) under the tongue every 6 (six) hours as needed. For abdominal cramping     ibuprofen 200 MG tablet  Commonly known as:  ADVIL,MOTRIN  Take 400-600 mg by mouth every 6 (six) hours as needed for headache.     ondansetron 4 MG tablet  Commonly known as:  ZOFRAN  Take 1 tablet (4 mg total) by mouth daily as needed for  nausea or vomiting.     ONE-A-DAY WOMENS FORMULA PO  Take by mouth daily.     oxyCODONE-acetaminophen 5-325 MG per tablet  Commonly known as:  ROXICET  Take 1 tablet by mouth every 6 (six) hours as needed.     polyethylene glycol packet  Commonly known as:  MIRALAX  Take 17 g by mouth daily.     saccharomyces boulardii 250 MG capsule  Commonly known as:  FLORASTOR  Take 250 mg by mouth 2 (two) times daily.     zolpidem 5 MG tablet  Commonly known as:  AMBIEN  Take 1 tablet (5 mg total) by mouth at bedtime as needed for sleep.       No Known Allergies  Past Medical History  Diagnosis Date  . Hypertension   . Headache(784.0)     migraines  . IBS (irritable bowel syndrome)   . Seasonal allergies   . Insulin resistance   . Anxiety   . Depression       Review of Systems  Constitutional: Negative for fever.  Gastrointestinal: Positive for abdominal  pain.  Genitourinary: Positive for dysuria and decreased urine volume. Negative for vaginal discharge.  Musculoskeletal: Negative for back pain.   BP 128/72 mmHg  Pulse 76  Temp(Src) 97.7 F (36.5 C)  Resp 16  Ht 5\' 4"  (1.626 m)  Wt 202 lb (91.627 kg)  BMI 34.66 kg/m2     Objective:   Physical Exam  Constitutional: She is oriented to person, place, and time. She appears well-developed and well-nourished.  HENT:  Head: Normocephalic and atraumatic.  Eyes: Conjunctivae are normal.  Neck: Normal range of motion.  Cardiovascular: Normal rate, regular rhythm, normal heart sounds and intact distal pulses.   Pulmonary/Chest: Effort normal and breath sounds normal.  Abdominal: Soft. Bowel sounds are normal. She exhibits no distension and no mass. There is tenderness. There is no rebound and no guarding.  Minimal suprapubic  Genitourinary:  deferred  Musculoskeletal: Normal range of motion.  Neurological: She is alert and oriented to person, place, and time.  Skin: Skin is warm and dry. No erythema.  Exposed area   Psychiatric: She has a normal mood and affect. Judgment normal.  Nursing note and vitals reviewed.       Assessment & Plan:  1. DYsuria with concern for UTI- Will treat CIPRO 500 mg BID #14/ Check labs,  Bland diet, Add cranberry juice, decrease caffeine/ acidic/ spicy foods  2. Advised possible yeast infection vs dryness- continue Probiotics. Advised Abolene for external genitalia dryness.   w/c if SX increase or ER.

## 2015-01-19 LAB — URINALYSIS, ROUTINE W REFLEX MICROSCOPIC
Bilirubin Urine: NEGATIVE
Glucose, UA: NEGATIVE mg/dL
HGB URINE DIPSTICK: NEGATIVE
KETONES UR: NEGATIVE mg/dL
Leukocytes, UA: NEGATIVE
NITRITE: NEGATIVE
PROTEIN: NEGATIVE mg/dL
Specific Gravity, Urine: 1.026 (ref 1.005–1.030)
UROBILINOGEN UA: 0.2 mg/dL (ref 0.0–1.0)
pH: 5.5 (ref 5.0–8.0)

## 2015-01-20 LAB — URINE CULTURE: Colony Count: 40000

## 2015-01-30 ENCOUNTER — Encounter: Payer: Self-pay | Admitting: Physician Assistant

## 2015-01-30 ENCOUNTER — Ambulatory Visit (INDEPENDENT_AMBULATORY_CARE_PROVIDER_SITE_OTHER): Payer: BLUE CROSS/BLUE SHIELD | Admitting: Physician Assistant

## 2015-01-30 VITALS — BP 110/72 | HR 72 | Temp 97.7°F | Resp 16 | Ht 64.0 in | Wt 201.0 lb

## 2015-01-30 DIAGNOSIS — E669 Obesity, unspecified: Secondary | ICD-10-CM

## 2015-01-30 DIAGNOSIS — F411 Generalized anxiety disorder: Secondary | ICD-10-CM

## 2015-01-30 DIAGNOSIS — R5383 Other fatigue: Secondary | ICD-10-CM

## 2015-01-30 MED ORDER — TRAZODONE HCL 150 MG PO TABS: 150.0000 mg | ORAL_TABLET | Freq: Every day | ORAL | Status: DC

## 2015-01-30 NOTE — Progress Notes (Signed)
Assessment and Plan: Insomnia/abnormal REM sleep- can try trazadone first  and can break down, and if this does not work we will try belsomra.  Anxiety- continue luvox for now and if we can get her sleep better will switch back to lexapro.   Future Appointments Date Time Provider Department Center  02/20/2015 3:30 PM Courtney Forcucci, PA-C [facility] None    HPI 29 y.o.female with history of HTN, HA, IBS, allergies, insulin resistance and depression/anxiety presents for 1 month follow up for insomnia. She had a sleep study that showed that she never reached REM without OSA. She tried the [place] but it caused imbalance, memory issues, and felt very tired in the morning. Occ migraine with periods but otherwise no headache. She is on luvox for anxiety and is interested in going back to lexapro but would like to get her sleep done first.   She has tried valium, xanax will help her fall asleep but will not keep her asleep and not a restful sleep, ambien.   Past Medical History  Diagnosis Date  . Hypertension   . Headache(784.0)     migraines  . IBS (irritable bowel syndrome)   . Seasonal allergies   . Insulin resistance   . Anxiety   . Depression      No Known Allergies    Current Outpatient Prescriptions on File Prior to Visit  Medication Sig Dispense Refill  . ALPRAZolam (XANAX) 1 MG tablet Use 1/2-1 pill BID 60 tablet 0  . atenolol (TENORMIN) 50 MG tablet TAKE ONE TABLET AT BEDTIME 30 tablet 5  . azelastine (ASTELIN) 0.1 % nasal spray Place 2 sprays into both nostrils 2 (two) times daily. Use in each nostril as directed 30 mL 2  . Biotin 10 MG TABS Take by mouth daily.    . cetirizine (ZYRTEC) 10 MG tablet Take 10 mg by mouth daily as needed for allergies.    . clindamycin (CLINDAGEL) 1 % gel Apply topically 2 (two) times daily. 30 g 4  . fluticasone (FLONASE) 50 MCG/ACT nasal spray Use 1 to 2  nasal sprays to each nostril daily 16 g 99  . fluvoxaMINE (LUVOX) 50 MG tablet  TAKE ONE TABLET AT BEDTIME 30 tablet 5  . hydrocortisone (ANUSOL-HC) 2.5 % rectal cream Place 1 application rectally daily as needed for hemorrhoids or itching.    . hyoscyamine (LEVSIN/SL) 0.125 MG SL tablet Place 1 tablet (0.125 mg total) under the tongue every 6 (six) hours as needed. For abdominal cramping 30 tablet 0  . ibuprofen (ADVIL,MOTRIN) 200 MG tablet Take 400-600 mg by mouth every 6 (six) hours as needed for headache.    . Multiple Vitamins-Calcium (ONE-A-DAY WOMENS FORMULA PO) Take by mouth daily.    . ondansetron (ZOFRAN) 4 MG tablet Take 1 tablet (4 mg total) by mouth daily as needed for nausea or vomiting. 30 tablet 2  . oxyCODONE-acetaminophen (ROXICET) 5-325 MG per tablet Take 1 tablet by mouth every 6 (six) hours as needed. 30 tablet 0  . polyethylene glycol (MIRALAX) packet Take 17 g by mouth daily. 5 each 0  . saccharomyces boulardii (FLORASTOR) 250 MG capsule Take 250 mg by mouth 2 (two) times daily.    Marland Kitchen zolpidem (AMBIEN) 5 MG tablet Take 1 tablet (5 mg total) by mouth at bedtime as needed for sleep. 30 tablet 0  . [DISCONTINUED] Drospirenone-Ethinyl Estradiol-Levomefol (SAFYRAL) 3-0.03-0.451 MG tablet Take 1 tablet by mouth daily. 1 Package 3   No current facility-administered medications on file prior  to visit.    ROS: all negative except above.   Physical Exam: Filed Weights   01/30/15 1546  Weight: 201 lb (91.173 kg)   BP 110/72 mmHg  Pulse 72  Temp(Src) 97.7 F (36.5 C)  Resp 16  Ht 5\' 4"  (1.626 m)  Wt 201 lb (91.173 kg)  BMI 34.48 kg/m2 General Appearance: Well nourished, in no apparent distress. Eyes: PERRLA, EOMs, conjunctiva no swelling or erythema Sinuses: No Frontal/maxillary tenderness ENT/Mouth: Ext aud canals clear, TMs without erythema, bulging. No erythema, swelling, or exudate on post pharynx.  Tonsils not swollen or erythematous. Hearing normal.  Neck: Supple, thyroid normal.  Respiratory: Respiratory effort normal, BS equal bilaterally  without rales, rhonchi, wheezing or stridor.  Cardio: RRR with no MRGs. Brisk peripheral pulses without edema.  Abdomen: Soft, + BS.  Non tender, no guarding, rebound, hernias, masses. Lymphatics: Non tender without lymphadenopathy.  Musculoskeletal: Full ROM, 5/5 strength, normal gait.  Skin: Warm, dry without rashes, lesions, ecchymosis.  Neuro: Cranial nerves intact. Normal muscle tone, no cerebellar symptoms. Sensation intact.  Psych: Awake and oriented X 3, normal affect, Insight and Judgment appropriate.     Quentin Mullingollier, Manpreet Kemmer, PA-C 4:11 PM [facility], [facility]

## 2015-01-30 NOTE — Patient Instructions (Addendum)
Insomnia Insomnia is frequent trouble falling and/or staying asleep. Insomnia can be a long term problem or a short term problem. Both are common. Insomnia can be a short term problem when the wakefulness is related to a certain stress or worry. Long term insomnia is often related to ongoing stress during waking hours and/or poor sleeping habits. Overtime, sleep deprivation itself can make the problem worse. Every little thing feels more severe because you are overtired and your ability to cope is decreased. CAUSES   Stress, anxiety, and depression.  Poor sleeping habits.  Distractions such as TV in the bedroom.  Naps close to bedtime.  Engaging in emotionally charged conversations before bed.  Technical reading before sleep.  Alcohol and other sedatives. They may make the problem worse. They can hurt normal sleep patterns and normal dream activity.  Stimulants such as caffeine for several hours prior to bedtime.  Pain syndromes and shortness of breath can cause insomnia.  Exercise late at night.  Changing time zones may cause sleeping problems (jet lag). It is sometimes helpful to have someone observe your sleeping patterns. They should look for periods of not breathing during the night (sleep apnea). They should also look to see how long those periods last. If you live alone or observers are uncertain, you can also be observed at a sleep clinic where your sleep patterns will be professionally monitored. Sleep apnea requires a checkup and treatment. Give your caregivers your medical history. Give your caregivers observations your family has made about your sleep.  SYMPTOMS   Not feeling rested in the morning.  Anxiety and restlessness at bedtime.  Difficulty falling and staying asleep. TREATMENT   Your caregiver may prescribe treatment for an underlying medical disorders. Your caregiver can give advice or help if you are using alcohol or other drugs for self-medication. Treatment  of underlying problems will usually eliminate insomnia problems.  Medications can be prescribed for short time use. They are generally not recommended for lengthy use.  Over-the-counter sleep medicines are not recommended for lengthy use. They can be habit forming.  You can promote easier sleeping by making lifestyle changes such as:  Using relaxation techniques that help with breathing and reduce muscle tension.  Exercising earlier in the day.  Changing your diet and the time of your last meal. No night time snacks.  Establish a regular time to go to bed.  Counseling can help with stressful problems and worry.  Soothing music and white noise may be helpful if there are background noises you cannot remove.  Stop tedious detailed work at least one hour before bedtime. HOME CARE INSTRUCTIONS   Keep a diary. Inform your caregiver about your progress. This includes any medication side effects. See your caregiver regularly. Take note of:  Times when you are asleep.  Times when you are awake during the night.  The quality of your sleep.  How you feel the next day. This information will help your caregiver care for you.  Get out of bed if you are still awake after 15 minutes. Read or do some quiet activity. Keep the lights down. Wait until you feel sleepy and go back to bed.  Keep regular sleeping and waking hours. Avoid naps.  Exercise regularly.  Avoid distractions at bedtime. Distractions include watching television or engaging in any intense or detailed activity like attempting to balance the household checkbook.  Develop a bedtime ritual. Keep a familiar routine of bathing, brushing your teeth, climbing into bed at the same   time each night, listening to soothing music. Routines increase the success of falling to sleep faster.  Use relaxation techniques. This can be using breathing and muscle tension release routines. It can also include visualizing peaceful scenes. You can  also help control troubling or intruding thoughts by keeping your mind occupied with boring or repetitive thoughts like the old concept of counting sheep. You can make it more creative like imagining planting one beautiful flower after another in your backyard garden.  During your day, work to eliminate stress. When this is not possible use some of the previous suggestions to help reduce the anxiety that accompanies stressful situations. MAKE SURE YOU:   Understand these instructions.  Will watch your condition.  Will get help right away if you are not doing well or get worse. Document Released: 09/19/2000 Document Revised: 12/15/2011 Document Reviewed: 10/20/2007 [employer] Patient Information 2015 [employer]. This information is not intended to replace advice given to you by your health care provider. Make sure you discuss any questions you have with your health care provider.  

## 2015-02-01 ENCOUNTER — Other Ambulatory Visit: Payer: Self-pay | Admitting: Physician Assistant

## 2015-02-13 ENCOUNTER — Other Ambulatory Visit: Payer: Self-pay | Admitting: Internal Medicine

## 2015-02-13 ENCOUNTER — Encounter: Payer: Self-pay | Admitting: Internal Medicine

## 2015-02-13 ENCOUNTER — Ambulatory Visit (INDEPENDENT_AMBULATORY_CARE_PROVIDER_SITE_OTHER): Payer: BLUE CROSS/BLUE SHIELD | Admitting: Internal Medicine

## 2015-02-13 VITALS — BP 114/70 | HR 74 | Temp 97.8°F | Resp 18 | Ht 64.0 in | Wt 200.0 lb

## 2015-02-13 DIAGNOSIS — R1013 Epigastric pain: Secondary | ICD-10-CM

## 2015-02-13 DIAGNOSIS — K219 Gastro-esophageal reflux disease without esophagitis: Secondary | ICD-10-CM

## 2015-02-13 MED ORDER — OMEPRAZOLE 40 MG PO CPDR: 40.0000 mg | DELAYED_RELEASE_CAPSULE | Freq: Every day | ORAL | Status: DC

## 2015-02-13 MED ORDER — CLINDAMYCIN PHOSPHATE 1 % EX GEL: Freq: Two times a day (BID) | CUTANEOUS | Status: DC

## 2015-02-13 MED ORDER — HYOSCYAMINE SULFATE 0.125 MG SL SUBL: 0.1250 mg | SUBLINGUAL_TABLET | Freq: Four times a day (QID) | SUBLINGUAL | Status: DC | PRN

## 2015-02-13 NOTE — Progress Notes (Signed)
Patient ID: Eileen ScheuermannSamantha Rios, female   DOB: [birth year], 29 y.o.   MRN: [id number] HPI  Patient complains of AB pain. Onset was 3 day ago. Symptoms have been gradually worsening. The pain is described as aching and cramping. Pain is located in the epigastric region without radiation.  Aggravating factors: standing.  Alleviating factors: antacids, belching, bowel movements and H2 blockers. Associated symptoms: belching, nausea and water brash. The patient denies chills, diarrhea, fever, frequency, hematochezia, hematuria, melena and vomiting. No sick contacts.  Review of Systems  Constitutional: Negative for fever, chills and malaise/fatigue.  HENT: Negative for congestion and sore throat.   Respiratory: Negative for cough, shortness of breath and wheezing.   Cardiovascular: Negative for chest pain and leg swelling.  Gastrointestinal: Positive for heartburn, nausea and abdominal pain. Negative for vomiting, diarrhea, constipation, blood in stool and melena.  Skin: Negative.   Neurological: Negative for headaches.    Physical exam:  General:  Well developed well nourished, non-toxic appearing, NAD Head:  NCAT, PERLA, normal EOM, conjunctiva normal, ears clear bilaterally with normal TMs, Oropharynx clear and moist without exudate, no oropharyngeal erythema or swelling Neck:  No JVD, no cervical adenopathy, no thyromegaly Lungs:  Clear to auscultation A&P, no wheeze, rhonchi, rales.  Normal effort Heart:  RRR, no MRGs, normal peripheral pulses Abd:  +BS x 4, no distention, soft, non-tender, with no guarding, rigidity, or rebound.   GU:  No CVA tenderness bilaterally Skin:  No rash, warm and dry Neuro:  A&O x 3, CN II-XII intact, normal gait Psych:  Normal affect, no ideations, normal judgement and insight  Assessment and Plan:   1. Gastroesophageal reflux disease, esophagitis presence not specified -zofran -hyoscamine -cont zantac - omeprazole (PRILOSEC) 40 MG capsule; Take 1 capsule (40  mg total) by mouth daily.  Dispense: 30 capsule; Refill: 1  2. Abdominal pain, epigastric -IBS likely plays - hyoscyamine (LEVSIN/SL) 0.125 MG SL tablet; Place 1 tablet (0.125 mg total) under the tongue every 6 (six) hours as needed. For abdominal cramping  Dispense: 30 tablet; Refill: 0

## 2015-02-13 NOTE — Patient Instructions (Signed)
Take omeprazole daily in the morning on an empty stomach for 2 weeks.   You can take the Hyoscamine or the levsin as you need to for stomach cramping.  Take zofran as needed for the first sign of nausea  Please let us know if your symptoms do not improve in 1 week.      Food Choices for Gastroesophageal Reflux Disease When you have gastroesophageal reflux disease (GERD), the foods you eat and your eating habits are very important. Choosing the right foods can help ease the discomfort of GERD. WHAT GENERAL GUIDELINES DO I NEED TO FOLLOW?  Choose fruits, vegetables, whole grains, low-fat dairy products, and low-fat meat, fish, and poultry.  Limit fats such as oils, salad dressings, butter, nuts, and avocado.  Keep a food diary to identify foods that cause symptoms.  Avoid foods that cause reflux. These may be different for different people.  Eat frequent small meals instead of three large meals each day.  Eat your meals slowly, in a relaxed setting.  Limit fried foods.  Cook foods using methods other than frying.  Avoid drinking alcohol.  Avoid drinking large amounts of liquids with your meals.  Avoid bending over or lying down until 2-3 hours after eating. WHAT FOODS ARE NOT RECOMMENDED? The following are some foods and drinks that may worsen your symptoms: Vegetables Tomatoes. Tomato juice. Tomato and spaghetti sauce. Chili peppers. Onion and garlic. Horseradish. Fruits Oranges, grapefruit, and lemon (fruit and juice). Meats High-fat meats, fish, and poultry. This includes hot dogs, ribs, ham, sausage, salami, and bacon. Dairy Whole milk and chocolate milk. Sour cream. Cream. Butter. Ice cream. Cream cheese.  Beverages Coffee and tea, with or without caffeine. Carbonated beverages or energy drinks. Condiments Hot sauce. Barbecue sauce.  Sweets/Desserts Chocolate and cocoa. Donuts. Peppermint and spearmint. Fats and Oils High-fat foods, including [place] fries and  potato chips. Other Vinegar. Strong spices, such as black pepper, white pepper, red pepper, cayenne, curry powder, cloves, ginger, and chili powder. The items listed above may not be a complete list of foods and beverages to avoid. Contact your dietitian for more information. Document Released: 09/22/2005 Document Revised: 09/27/2013 Document Reviewed: 07/27/2013 [facility] Patient Information 2015 [place], [place]. This information is not intended to replace advice given to you by your health care provider. Make sure you discuss any questions you have with your health care provider.

## 2015-02-15 IMAGING — US US TRANSVAGINAL NON-OB
1 series · 14 of 25 positions shown · non-contrast
Comparison: None

CLINICAL DATA: Menorrhagia.



[Series 1: us transvaginal non-ob · 14 of 79 slices shown]
[im 1/79]
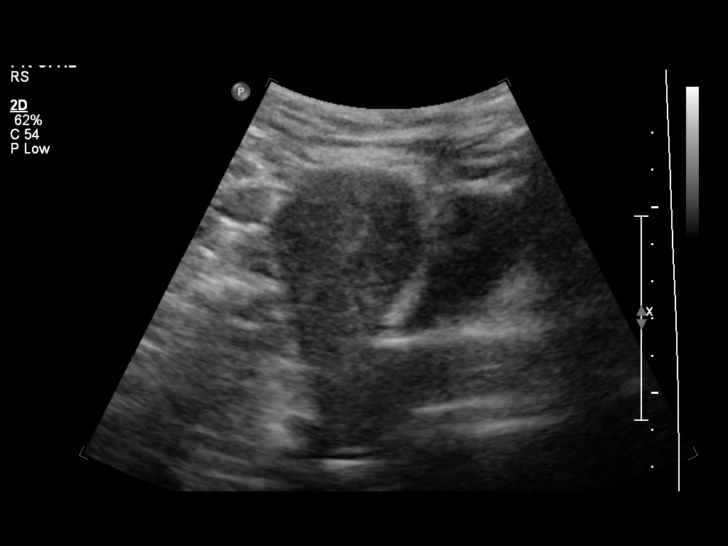
[im 7/79]
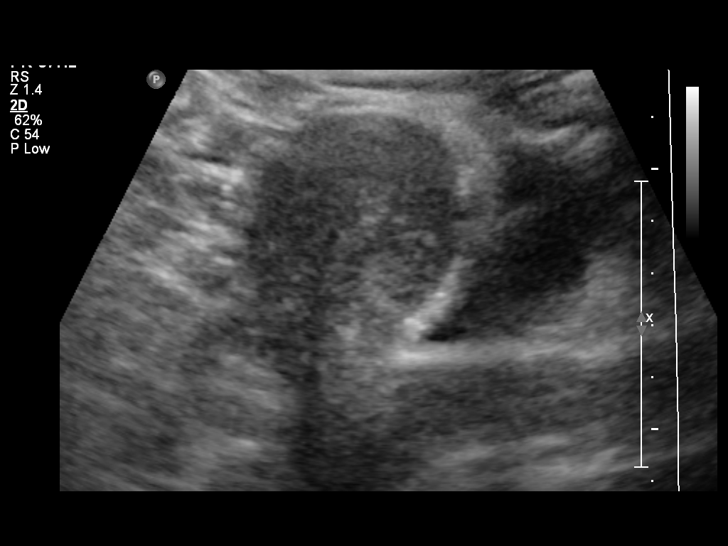
[im 14/79]
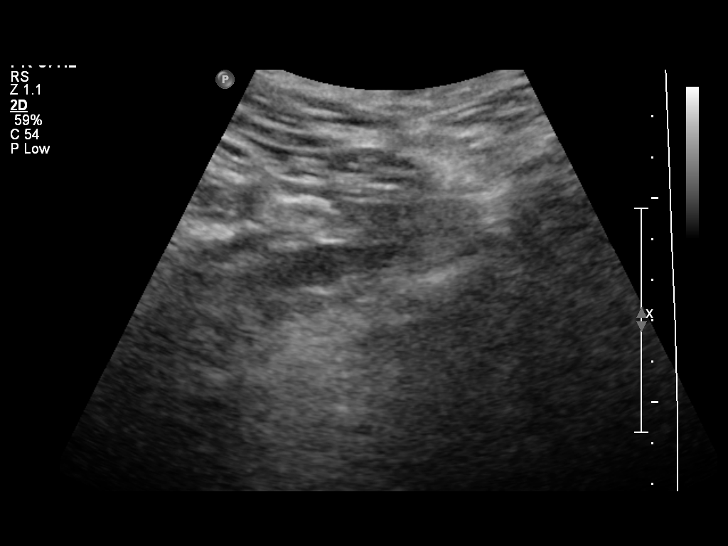
[im 20/79]
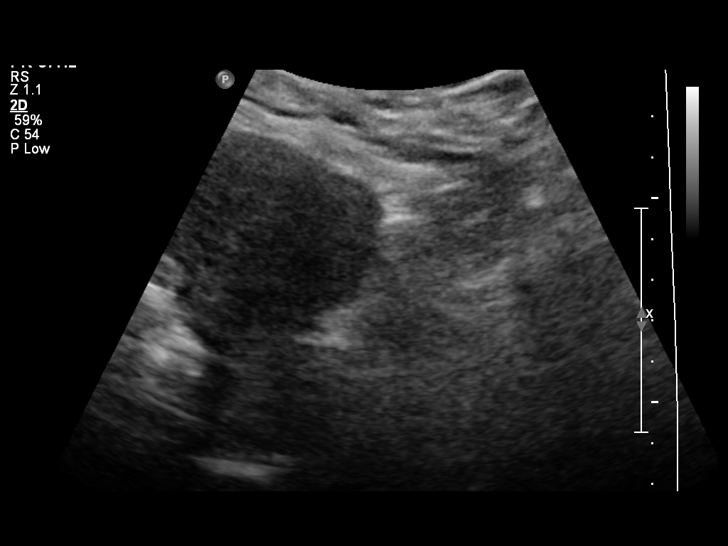
[im 27/79]
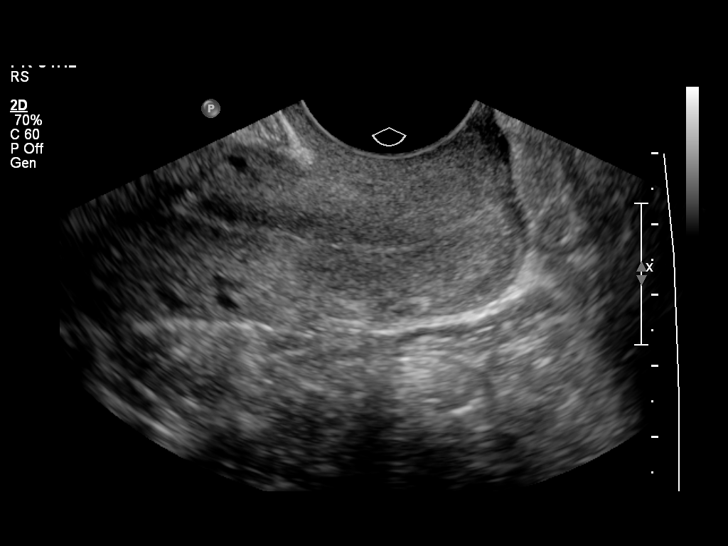
[im 30/79]
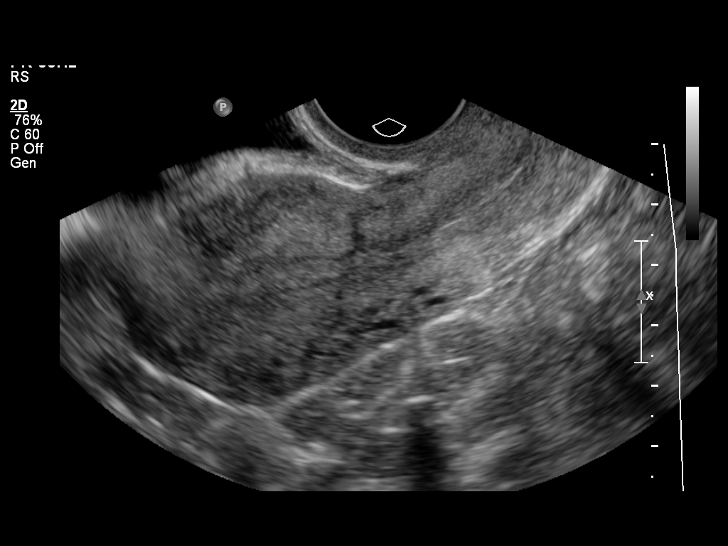
[im 36/79]
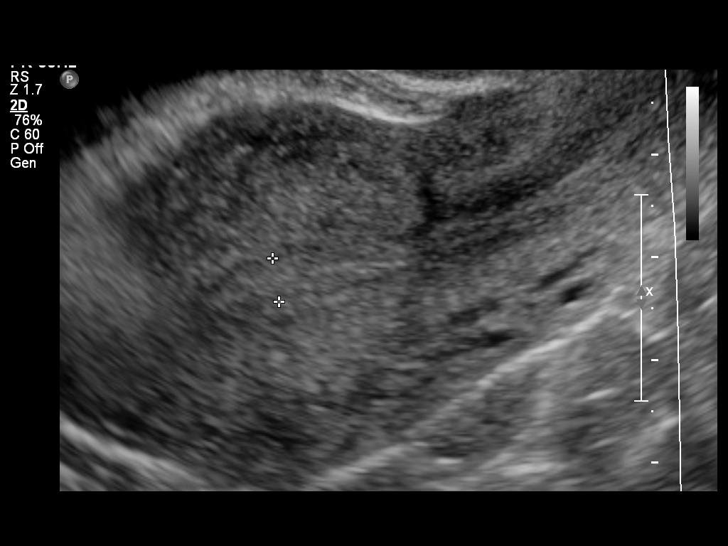
[im 43/79]
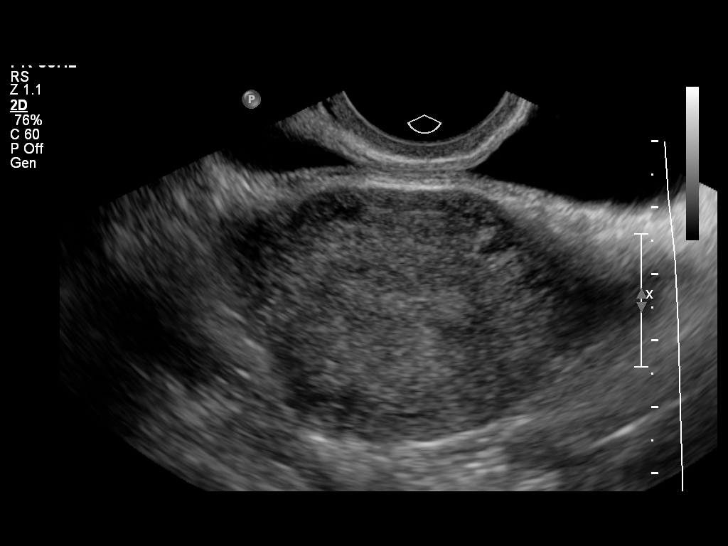
[im 49/79]
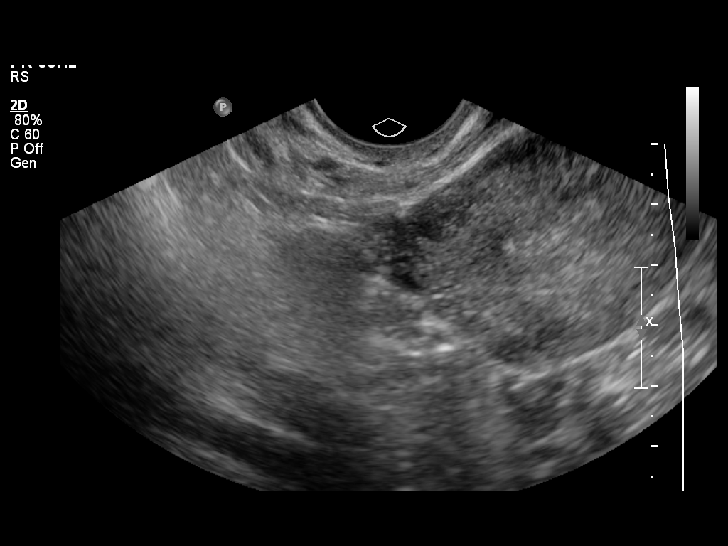
[im 53/79]
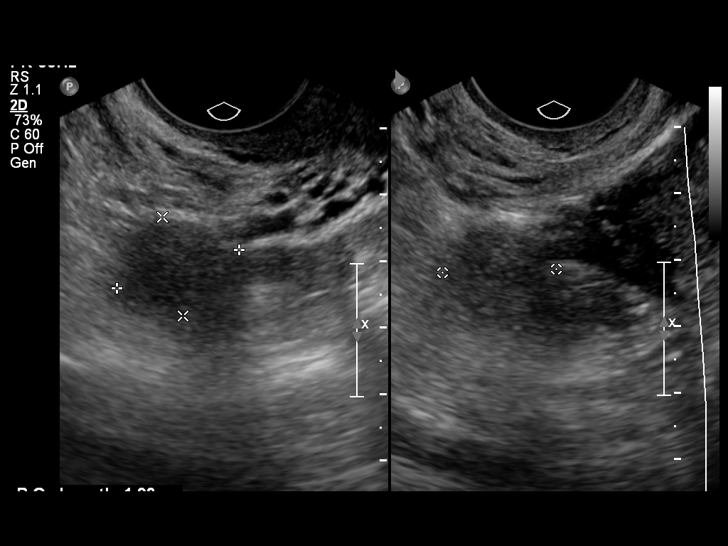
[im 59/79]
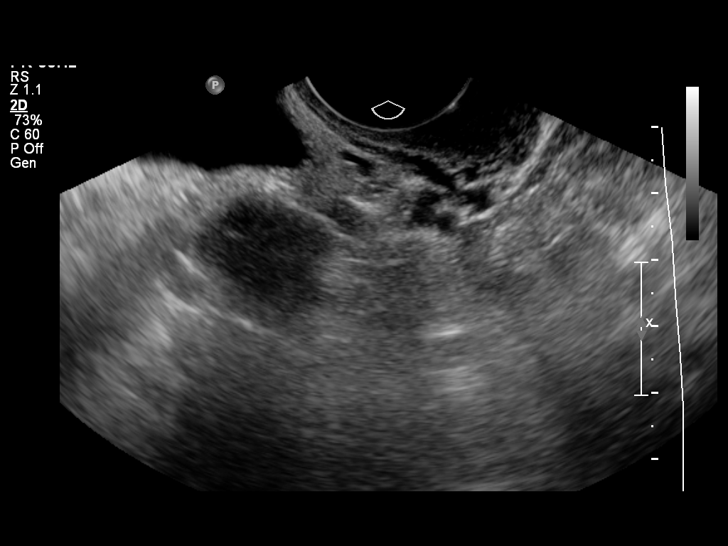
[im 66/79]
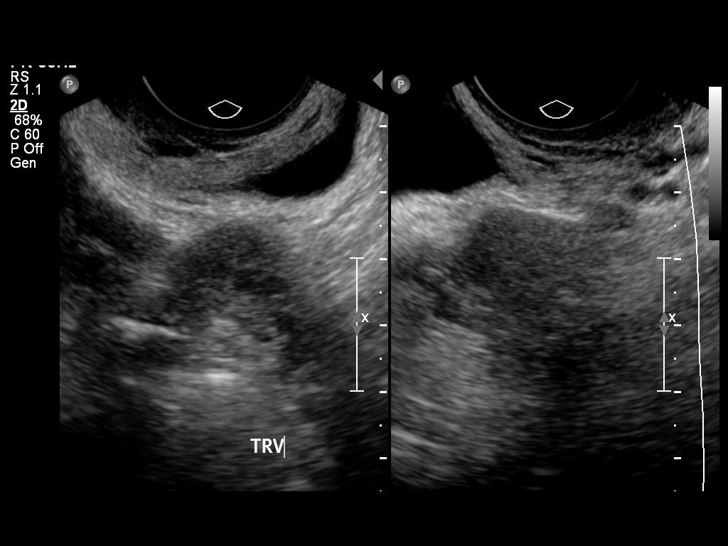
[im 72/79]
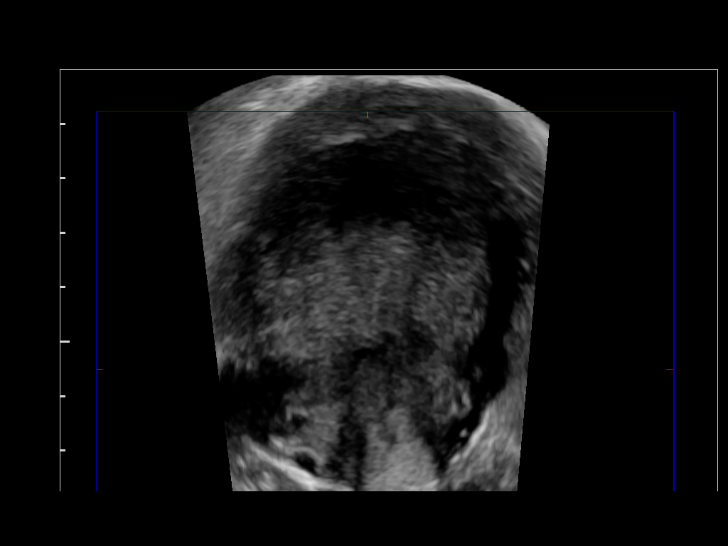
[im 79/79]
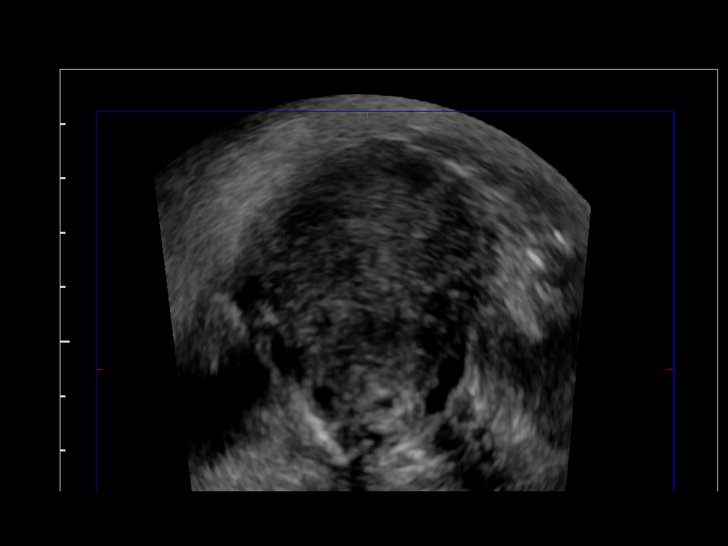

[14 of 25 positions shown; findings below may reference images not displayed]

FINDINGS: Uterus

Measurements: 8.8 x 5.3 x 3.8 cm. Linear region of hypoechogenicity
anteriorly in the uterine body likely represents prior Cesarean
section scar.. No fibroids or other mass visualized.

Endometrium

Thickness: 4.3 mm.  No focal abnormality visualized.

Right ovary

Measurements: 1.9 x 1.5 x 1.7 cm. No mass identified, although the
ovary was suboptimally visualized due to adjacent bowel.

Left ovary

Measurements: 2.2 x 1.6 x 2.1 cm. Normal appearance/no adnexal mass.

Other findings

No free fluid.
IMPRESSION: 1. Normal appearance of the endometrium.  No uterine mass.
2. Unremarkable appearance of the ovaries.

## 2015-02-16 ENCOUNTER — Other Ambulatory Visit: Payer: Self-pay | Admitting: *Deleted

## 2015-02-16 MED ORDER — ONDANSETRON HCL 4 MG PO TABS: 4.0000 mg | ORAL_TABLET | Freq: Every day | ORAL | Status: DC | PRN

## 2015-02-20 ENCOUNTER — Ambulatory Visit: Payer: Self-pay | Admitting: Internal Medicine

## 2015-03-03 ENCOUNTER — Encounter: Payer: Self-pay | Admitting: Internal Medicine

## 2015-03-03 ENCOUNTER — Other Ambulatory Visit: Payer: Self-pay | Admitting: Physician Assistant

## 2015-03-03 DIAGNOSIS — J029 Acute pharyngitis, unspecified: Secondary | ICD-10-CM

## 2015-03-03 MED ORDER — AZITHROMYCIN 250 MG PO TABS: ORAL_TABLET | ORAL | Status: DC

## 2015-03-03 MED ORDER — PREDNISONE 10 MG PO TABS: ORAL_TABLET | ORAL | Status: DC

## 2015-03-06 ENCOUNTER — Encounter: Payer: Self-pay | Admitting: Internal Medicine

## 2015-03-06 ENCOUNTER — Other Ambulatory Visit: Payer: Self-pay | Admitting: Physician Assistant

## 2015-03-06 MED ORDER — SUVOREXANT 10 MG PO TABS: 10.0000 mg | ORAL_TABLET | Freq: Every day | ORAL | Status: DC

## 2015-03-06 MED ORDER — SUVOREXANT 20 MG PO TABS: 20.0000 mg | ORAL_TABLET | Freq: Every day | ORAL | Status: DC

## 2015-03-06 MED ORDER — SUVOREXANT 5 MG PO TABS: 5.0000 mg | ORAL_TABLET | Freq: Every day | ORAL | Status: DC

## 2015-03-06 MED ORDER — SUVOREXANT 15 MG PO TABS: 15.0000 mg | ORAL_TABLET | Freq: Every day | ORAL | Status: DC

## 2015-03-07 ENCOUNTER — Encounter: Payer: Self-pay | Admitting: Internal Medicine

## 2015-03-07 DIAGNOSIS — G4700 Insomnia, unspecified: Secondary | ICD-10-CM | POA: Insufficient documentation

## 2015-03-08 ENCOUNTER — Other Ambulatory Visit: Payer: Self-pay | Admitting: Physician Assistant

## 2015-03-16 ENCOUNTER — Encounter: Payer: Self-pay | Admitting: Internal Medicine

## 2015-03-22 ENCOUNTER — Ambulatory Visit (INDEPENDENT_AMBULATORY_CARE_PROVIDER_SITE_OTHER): Payer: BLUE CROSS/BLUE SHIELD | Admitting: Physician Assistant

## 2015-03-22 ENCOUNTER — Encounter: Payer: Self-pay | Admitting: Physician Assistant

## 2015-03-22 VITALS — BP 122/78 | HR 80 | Temp 97.7°F | Resp 16 | Ht 64.0 in | Wt 206.0 lb

## 2015-03-22 DIAGNOSIS — E669 Obesity, unspecified: Secondary | ICD-10-CM

## 2015-03-22 DIAGNOSIS — G478 Other sleep disorders: Secondary | ICD-10-CM

## 2015-03-22 DIAGNOSIS — G4752 REM sleep behavior disorder: Secondary | ICD-10-CM

## 2015-03-22 DIAGNOSIS — F411 Generalized anxiety disorder: Secondary | ICD-10-CM

## 2015-03-22 DIAGNOSIS — R5383 Other fatigue: Secondary | ICD-10-CM

## 2015-03-22 MED ORDER — ESZOPICLONE 2 MG PO TABS: 2.0000 mg | ORAL_TABLET | Freq: Every day | ORAL | Status: DC

## 2015-03-22 MED ORDER — ESCITALOPRAM OXALATE 20 MG PO TABS: 20.0000 mg | ORAL_TABLET | Freq: Every day | ORAL | Status: DC

## 2015-03-22 MED ORDER — AMPHETAMINE-DEXTROAMPHETAMINE 10 MG PO TABS: 10.0000 mg | ORAL_TABLET | Freq: Two times a day (BID) | ORAL | Status: DC

## 2015-03-22 NOTE — Patient Instructions (Signed)
Stop luvox and go to lexapro Try adderall 5 mg  In the morning, can go up to 20 mg in the morning Try the lunesta 2 mg, and if this does not help will retry the belsomra.     Insomnia Insomnia is frequent trouble falling and/or staying asleep. Insomnia can be a long term problem or a short term problem. Both are common. Insomnia can be a short term problem when the wakefulness is related to a certain stress or worry. Long term insomnia is often related to ongoing stress during waking hours and/or poor sleeping habits. Overtime, sleep deprivation itself can make the problem worse. Every little thing feels more severe because you are overtired and your ability to cope is decreased. CAUSES   Stress, anxiety, and depression.  Poor sleeping habits.  Distractions such as TV in the bedroom.  Naps close to bedtime.  Engaging in emotionally charged conversations before bed.  Technical reading before sleep.  Alcohol and other sedatives. They may make the problem worse. They can hurt normal sleep patterns and normal dream activity.  Stimulants such as caffeine for several hours prior to bedtime.  Pain syndromes and shortness of breath can cause insomnia.  Exercise late at night.  Changing time zones may cause sleeping problems (jet lag). It is sometimes helpful to have someone observe your sleeping patterns. They should look for periods of not breathing during the night (sleep apnea). They should also look to see how long those periods last. If you live alone or observers are uncertain, you can also be observed at a sleep clinic where your sleep patterns will be professionally monitored. Sleep apnea requires a checkup and treatment. Give your caregivers your medical history. Give your caregivers observations your family has made about your sleep.  SYMPTOMS   Not feeling rested in the morning.  Anxiety and restlessness at bedtime.  Difficulty falling and staying asleep. TREATMENT   Your  caregiver may prescribe treatment for an underlying medical disorders. Your caregiver can give advice or help if you are using alcohol or other drugs for self-medication. Treatment of underlying problems will usually eliminate insomnia problems.  Medications can be prescribed for short time use. They are generally not recommended for lengthy use.  Over-the-counter sleep medicines are not recommended for lengthy use. They can be habit forming.  You can promote easier sleeping by making lifestyle changes such as:  Using relaxation techniques that help with breathing and reduce muscle tension.  Exercising earlier in the day.  Changing your diet and the time of your last meal. No night time snacks.  Establish a regular time to go to bed.  Counseling can help with stressful problems and worry.  Soothing music and white noise may be helpful if there are background noises you cannot remove.  Stop tedious detailed work at least one hour before bedtime. HOME CARE INSTRUCTIONS   Keep a diary. Inform your caregiver about your progress. This includes any medication side effects. See your caregiver regularly. Take note of:  Times when you are asleep.  Times when you are awake during the night.  The quality of your sleep.  How you feel the next day. This information will help your caregiver care for you.  Get out of bed if you are still awake after 15 minutes. Read or do some quiet activity. Keep the lights down. Wait until you feel sleepy and go back to bed.  Keep regular sleeping and waking hours. Avoid naps.  Exercise regularly.  Avoid  distractions at bedtime. Distractions include watching television or engaging in any intense or detailed activity like attempting to balance the household checkbook.  Develop a bedtime ritual. Keep a familiar routine of bathing, brushing your teeth, climbing into bed at the same time each night, listening to soothing music. Routines increase the success  of falling to sleep faster.  Use relaxation techniques. This can be using breathing and muscle tension release routines. It can also include visualizing peaceful scenes. You can also help control troubling or intruding thoughts by keeping your mind occupied with boring or repetitive thoughts like the old concept of counting sheep. You can make it more creative like imagining planting one beautiful flower after another in your backyard garden.  During your day, work to eliminate stress. When this is not possible use some of the previous suggestions to help reduce the anxiety that accompanies stressful situations. MAKE SURE YOU:   Understand these instructions.  Will watch your condition.  Will get help right away if you are not doing well or get worse. Document Released: 09/19/2000 Document Revised: 12/15/2011 Document Reviewed: 10/20/2007 [facility] Patient Information 2015 [place], [place]. This information is not intended to replace advice given to you by your health care provider. Make sure you discuss any questions you have with your health care provider.

## 2015-03-22 NOTE — Progress Notes (Signed)
Assessment and Plan: Insomnia/abnormal REM sleep- try lunesta and if this does not help belsomra Fatigue due to sleep/wake cycle- can not tolerate nuvigil, will try adderall low dose.  Anxiety- continue luvox for now and if we can get her sleep better will switch back to lexapro. May benefit from seeing counselor/psych.   Future Appointments Date Time Provider Department Center  04/18/2015 4:00 PM Quentin Mulling, PA-C [facility] None    HPI 29 y.o.female with history of HTN, HA, IBS, allergies, insulin resistance and depression/anxiety presents for 1 month follow up for insomnia. She had a sleep study that showed that she never reached REM without OSA. She tried the [place] but it caused imbalance, memory issues, and felt very tired in the morning. Occ migraine with periods but otherwise no headache. She has tried trazodone, xanax and valium for sleep as well but feels that she is unable to function the next day to take care of her young son OR that she will wake up in the night. We wanted to try belsomra for sleep however her insurance will not cover it without trying lunesta first.   She continues to have exhaustion during the day, that affects her quality of life and ability to spend time with her family, due to abnormal REM and these symptoms, she was tried on nuvigil but states that she has symptoms with this, sweating, unable to sleep We may try her on adderall.   If this still does not help we will refer her to a sleep medicine doctor, Dr. Alfredo Martinez. .    Past Medical History  Diagnosis Date  . Hypertension   . Headache(784.0)     migraines  . IBS (irritable bowel syndrome)   . Seasonal allergies   . Insulin resistance   . Anxiety   . Depression      No Known Allergies    Current Outpatient Prescriptions on File Prior to Visit  Medication Sig Dispense Refill  . ALPRAZolam (XANAX) 1 MG tablet TAKE ONE-HALF TO ONE TABLET TWICE DAILY 60 tablet 1  . atenolol (TENORMIN) 50 MG  tablet TAKE ONE TABLET AT BEDTIME 30 tablet 3  . azelastine (ASTELIN) 0.1 % nasal spray Place 2 sprays into both nostrils 2 (two) times daily. Use in each nostril as directed 30 mL 2  . Biotin 10 MG TABS Take by mouth daily.    . cetirizine (ZYRTEC) 10 MG tablet Take 10 mg by mouth daily as needed for allergies.    . clindamycin (CLINDAGEL) 1 % gel Apply topically 2 (two) times daily. 30 g 4  . fluticasone (FLONASE) 50 MCG/ACT nasal spray Use 1 to 2  nasal sprays to each nostril daily 16 g 99  . fluvoxaMINE (LUVOX) 50 MG tablet TAKE ONE TABLET AT BEDTIME 30 tablet 5  . hydrocortisone (ANUSOL-HC) 2.5 % rectal cream Place 1 application rectally daily as needed for hemorrhoids or itching.    . hyoscyamine (LEVSIN/SL) 0.125 MG SL tablet Place 1 tablet (0.125 mg total) under the tongue every 6 (six) hours as needed. For abdominal cramping 30 tablet 0  . ibuprofen (ADVIL,MOTRIN) 200 MG tablet Take 400-600 mg by mouth every 6 (six) hours as needed for headache.    . Multiple Vitamins-Calcium (ONE-A-DAY WOMENS FORMULA PO) Take by mouth daily.    . Norethindrone Acetate-Ethinyl Estrad-FE (LOESTRIN 24 FE) 1-20 MG-MCG(24) tablet   5  . omeprazole (PRILOSEC) 40 MG capsule Take 1 capsule (40 mg total) by mouth daily. 30 capsule 1  . ondansetron (  ZOFRAN) 4 MG tablet Take 1 tablet (4 mg total) by mouth daily as needed for nausea or vomiting. 30 tablet 2  . oxyCODONE-acetaminophen (ROXICET) 5-325 MG per tablet Take 1 tablet by mouth every 6 (six) hours as needed. 30 tablet 0  . polyethylene glycol (MIRALAX) packet Take 17 g by mouth daily. 5 each 0  . saccharomyces boulardii (FLORASTOR) 250 MG capsule Take 250 mg by mouth 2 (two) times daily.    . [DISCONTINUED] Drospirenone-Ethinyl Estradiol-Levomefol (SAFYRAL) 3-0.03-0.451 MG tablet Take 1 tablet by mouth daily. 1 Package 3   No current facility-administered medications on file prior to visit.    ROS: all negative except above.   Physical Exam: Filed  Weights   03/22/15 1535  Weight: 206 lb (93.441 kg)   BP 122/78 mmHg  Pulse 80  Temp(Src) 97.7 F (36.5 C)  Resp 16  Wt 206 lb (93.441 kg) General Appearance: Well nourished, in no apparent distress. Eyes: PERRLA, EOMs, conjunctiva no swelling or erythema Sinuses: No Frontal/maxillary tenderness ENT/Mouth: Ext aud canals clear, TMs without erythema, bulging. No erythema, swelling, or exudate on post pharynx.  Tonsils not swollen or erythematous. Hearing normal.  Neck: Supple, thyroid normal.  Respiratory: Respiratory effort normal, BS equal bilaterally without rales, rhonchi, wheezing or stridor.  Cardio: RRR with no MRGs. Brisk peripheral pulses without edema.  Abdomen: Soft, + BS.  Non tender, no guarding, rebound, hernias, masses. Lymphatics: Non tender without lymphadenopathy.  Musculoskeletal: Full ROM, 5/5 strength, normal gait.  Skin: Warm, dry without rashes, lesions, ecchymosis.  Neuro: Cranial nerves intact. Normal muscle tone, no cerebellar symptoms. Sensation intact.  Psych: Awake and oriented X 3, normal affect, Insight and Judgment appropriate.     Quentin Mulling, PA-C 3:42 PM [facility]

## 2015-03-26 ENCOUNTER — Encounter: Payer: Self-pay | Admitting: Internal Medicine

## 2015-04-04 ENCOUNTER — Encounter: Payer: Self-pay | Admitting: Physician Assistant

## 2015-04-04 ENCOUNTER — Other Ambulatory Visit: Payer: Self-pay | Admitting: Physician Assistant

## 2015-04-04 MED ORDER — FLUCONAZOLE 150 MG PO TABS: 150.0000 mg | ORAL_TABLET | Freq: Every day | ORAL | Status: DC

## 2015-04-11 ENCOUNTER — Encounter: Payer: Self-pay | Admitting: Physician Assistant

## 2015-04-11 ENCOUNTER — Ambulatory Visit (INDEPENDENT_AMBULATORY_CARE_PROVIDER_SITE_OTHER): Payer: BLUE CROSS/BLUE SHIELD | Admitting: Physician Assistant

## 2015-04-11 VITALS — BP 110/78 | HR 80 | Temp 97.7°F | Resp 16 | Ht 64.0 in | Wt 201.0 lb

## 2015-04-11 DIAGNOSIS — E559 Vitamin D deficiency, unspecified: Secondary | ICD-10-CM

## 2015-04-11 DIAGNOSIS — E669 Obesity, unspecified: Secondary | ICD-10-CM

## 2015-04-11 DIAGNOSIS — E785 Hyperlipidemia, unspecified: Secondary | ICD-10-CM

## 2015-04-11 DIAGNOSIS — N898 Other specified noninflammatory disorders of vagina: Secondary | ICD-10-CM

## 2015-04-11 DIAGNOSIS — R300 Dysuria: Secondary | ICD-10-CM

## 2015-04-11 DIAGNOSIS — O132 Gestational [pregnancy-induced] hypertension without significant proteinuria, second trimester: Secondary | ICD-10-CM

## 2015-04-11 DIAGNOSIS — E8881 Metabolic syndrome: Secondary | ICD-10-CM

## 2015-04-11 DIAGNOSIS — D649 Anemia, unspecified: Secondary | ICD-10-CM

## 2015-04-11 LAB — CBC WITH DIFFERENTIAL/PLATELET
BASOS ABS: 0 10*3/uL (ref 0.0–0.1)
BASOS PCT: 0 % (ref 0–1)
EOS PCT: 2 % (ref 0–5)
Eosinophils Absolute: 0.2 10*3/uL (ref 0.0–0.7)
HCT: 40.9 % (ref 36.0–46.0)
Hemoglobin: 13.9 g/dL (ref 12.0–15.0)
Lymphocytes Relative: 27 % (ref 12–46)
Lymphs Abs: 2.2 10*3/uL (ref 0.7–4.0)
MCH: 30.2 pg (ref 26.0–34.0)
MCHC: 34 g/dL (ref 30.0–36.0)
MCV: 88.9 fL (ref 78.0–100.0)
MONO ABS: 0.6 10*3/uL (ref 0.1–1.0)
MONOS PCT: 7 % (ref 3–12)
MPV: 9.7 fL (ref 8.6–12.4)
NEUTROS ABS: 5.2 10*3/uL (ref 1.7–7.7)
Neutrophils Relative %: 64 % (ref 43–77)
PLATELETS: 285 10*3/uL (ref 150–400)
RBC: 4.6 MIL/uL (ref 3.87–5.11)
RDW: 13.1 % (ref 11.5–15.5)
WBC: 8.1 10*3/uL (ref 4.0–10.5)

## 2015-04-11 MED ORDER — METRONIDAZOLE 0.75 % VA GEL: 1.0000 | Freq: Every day | VAGINAL | Status: DC

## 2015-04-11 MED ORDER — NYSTATIN 100000 UNIT/GM EX POWD: CUTANEOUS | Status: DC

## 2015-04-11 NOTE — Progress Notes (Signed)
Subjective:    Patient ID: Eileen Rios, female    DOB: 28-Jul-1986, 29 y.o.   MRN: [id number]  HPI 29 y.o. female with history of HTN, obesity, migraines, insulin resistance, insomnia presents with a rash and for follow up. Her blood pressure has been controlled at home, today their BP is BP: 110/78 mmHg  She does not workout. She denies chest pain, shortness of breath, dizziness.  She is not on cholesterol medication and denies myalgias. Her cholesterol is at goal. The cholesterol last visit was:   Lab Results  Component Value Date   CHOL 173 11/07/2014   HDL 47 11/07/2014   LDLCALC 91 11/07/2014   TRIG 173* 11/07/2014   CHOLHDL 3.7 11/07/2014   She has insulin resistance due to obesity but her last A1C in the office was:  Lab Results  Component Value Date   HGBA1C 5.3 11/07/2014   Patient is on Vitamin D supplement.   Lab Results  Component Value Date   VD25OH 34 11/07/2014     She contacted the office on the 29th with a possible yeast infection, itching with burning without any discharge. She was given diflucan and tried OTC monostat cream, and then noticed a rash on Saturday. She continues to itch, it is getting better. She states the burning with urination a beginning of stream resolved after the diflucan. She is married, no STD history. She is on BCP, she has 1 week of regular pills and then will start sugar pills. Denies nausea, fever, chills, constipation, diarrhea.   She is on Belsomra  which is not helping, she is on Adderall  which is helping some with her energy.   Current Outpatient Prescriptions on File Prior to Visit  Medication Sig Dispense Refill  . ALPRAZolam (XANAX) 1 MG tablet TAKE ONE-HALF TO ONE TABLET TWICE DAILY 60 tablet 1  . amphetamine-dextroamphetamine (ADDERALL) 10 MG tablet Take 1 tablet (10 mg total) by mouth 2 (two) times daily with a meal. 60 tablet 0  . atenolol (TENORMIN) 50 MG tablet TAKE ONE TABLET AT BEDTIME 30 tablet 3  .  azelastine (ASTELIN) 0.1 % nasal spray Place 2 sprays into both nostrils 2 (two) times daily. Use in each nostril as directed 30 mL 2  . bifidobacterium infantis (ALIGN) capsule Take 1 capsule by mouth daily.    . Biotin 10 MG TABS Take by mouth daily.    . cetirizine (ZYRTEC) 10 MG tablet Take 10 mg by mouth daily as needed for allergies.    . clindamycin (CLINDAGEL) 1 % gel Apply topically 2 (two) times daily. 30 g 4  . escitalopram (LEXAPRO) 20 MG tablet Take 1 tablet (20 mg total) by mouth daily. 30 tablet 2  . fluconazole (DIFLUCAN) 150 MG tablet Take 1 tablet (150 mg total) by mouth daily. 1 tablet 3  . fluticasone (FLONASE) 50 MCG/ACT nasal spray Use 1 to 2  nasal sprays to each nostril daily 16 g 99  . hydrocortisone (ANUSOL-HC) 2.5 % rectal cream Place 1 application rectally daily as needed for hemorrhoids or itching.    . hyoscyamine (LEVSIN/SL) 0.125 MG SL tablet Place 1 tablet (0.125 mg total) under the tongue every 6 (six) hours as needed. For abdominal cramping 30 tablet 0  . ibuprofen (ADVIL,MOTRIN) 200 MG tablet Take 400-600 mg by mouth every 6 (six) hours as needed for headache.    . Multiple Vitamins-Calcium (ONE-A-DAY WOMENS FORMULA PO) Take by mouth daily.    . Norethindrone Acetate-Ethinyl Estrad-FE (LOESTRIN 24  FE) 1-20 MG-MCG(24) tablet   5  . omeprazole (PRILOSEC) 40 MG capsule Take 1 capsule (40 mg total) by mouth daily. 30 capsule 1  . ondansetron (ZOFRAN) 4 MG tablet Take 1 tablet (4 mg total) by mouth daily as needed for nausea or vomiting. 30 tablet 2  . oxyCODONE-acetaminophen (ROXICET) 5-325 MG per tablet Take 1 tablet by mouth every 6 (six) hours as needed. 30 tablet 0  . polyethylene glycol (MIRALAX) packet Take 17 g by mouth daily. 5 each 0  . [DISCONTINUED] Drospirenone-Ethinyl Estradiol-Levomefol (SAFYRAL) 3-0.03-0.451 MG tablet Take 1 tablet by mouth daily. 1 Package 3   No current facility-administered medications on file prior to visit.   Past Medical  History  Diagnosis Date  . Hypertension   . Headache(784.0)     migraines  . IBS (irritable bowel syndrome)   . Seasonal allergies   . Insulin resistance   . Anxiety   . Depression     Review of Systems  Constitutional: Negative.   HENT: Negative.   Respiratory: Negative.   Cardiovascular: Negative.   Gastrointestinal: Negative.   Genitourinary: Positive for dysuria and vaginal discharge. Negative for urgency, frequency, hematuria, flank pain, decreased urine volume, vaginal bleeding, enuresis, difficulty urinating, genital sores, vaginal pain, menstrual problem, pelvic pain and dyspareunia.  Musculoskeletal: Negative.   Neurological: Negative.       Objective:   Physical Exam  Constitutional: She is oriented to person, place, and time. She appears well-developed and well-nourished.  HENT:  Head: Normocephalic and atraumatic.  Eyes: Conjunctivae are normal.  Neck: Normal range of motion.  Cardiovascular: Normal rate, regular rhythm, normal heart sounds and intact distal pulses.   Pulmonary/Chest: Effort normal and breath sounds normal.  Abdominal: Soft. Bowel sounds are normal. She exhibits no distension and no mass. There is tenderness. There is no rebound and no guarding.  Minimal suprapubic  Genitourinary:  VULVA: normal appearing vulva with no masses, tenderness or lesions, VAGINA: vaginal tenderness and erythema without lesions/masses, vaginal discharge - white, copious and malodorous, WET MOUNT done - results: sent to the lab.  Musculoskeletal: Normal range of motion.  Neurological: She is alert and oriented to person, place, and time.  Skin: Skin is warm and dry. No erythema.  Exposed area  Psychiatric: She has a normal mood and affect. Judgment normal.  Nursing note and vitals reviewed.      Assessment & Plan:  Hypertension: Continue medication, monitor blood pressure at home. Continue DASH diet.  Reminder to go to the ER if any CP, SOB, nausea, dizziness, severe  HA, changes vision/speech, left arm numbness and tingling, and jaw pain. Cholesterol: Continue diet and exercise. Check cholesterol.  Pre-diabetes-Continue diet and exercise. Check A1C Vitamin D Def- check level and continue medications.  Fatigue- increase belsomra to 15 or 20 mg, and continue the adderall.  Vaginal discharge- likely BV, will treat and get Wet Prep, get UA since some dysuria.

## 2015-04-11 NOTE — Patient Instructions (Signed)
Bacterial Vaginosis Bacterial vaginosis is a vaginal infection that occurs when the normal balance of bacteria in the vagina is disrupted. It results from an overgrowth of certain bacteria. This is the most common vaginal infection in women of childbearing age. Treatment is important to prevent complications, especially in pregnant women, as it can cause a premature delivery. CAUSES  Bacterial vaginosis is caused by an increase in harmful bacteria that are normally present in smaller amounts in the vagina. Several different kinds of bacteria can cause bacterial vaginosis. However, the reason that the condition develops is not fully understood. RISK FACTORS Certain activities or behaviors can put you at an increased risk of developing bacterial vaginosis, including:  Douching.  Using an intrauterine device (IUD) for contraception. Women do not get bacterial vaginosis from toilet seats, bedding, swimming pools, or contact with objects around them. SIGNS AND SYMPTOMS  Some women with bacterial vaginosis have no signs or symptoms. Common symptoms include:  Grey vaginal discharge.  A fishlike odor with discharge, especially after sexual intercourse.  Itching or burning of the vagina and vulva.  Burning or pain with urination. DIAGNOSIS  Your health care provider will take a medical history and examine the vagina for signs of bacterial vaginosis. A sample of vaginal fluid may be taken. Your health care provider will look at this sample under a microscope to check for bacteria and abnormal cells. A vaginal pH test may also be done.  TREATMENT  Bacterial vaginosis may be treated with antibiotic medicines. These may be given in the form of a pill or a vaginal cream. A second round of antibiotics may be prescribed if the condition comes back after treatment.  HOME CARE INSTRUCTIONS   Only take over-the-counter or prescription medicines as directed by your health care provider.  If antibiotic  medicine was prescribed, take it as directed. Make sure you finish it even if you start to feel better.  Do not have sex until treatment is completed.  Tell all sexual partners that you have a vaginal infection. They should see their health care provider and be treated if they have problems, such as a mild rash or itching. SEEK MEDICAL CARE IF:   Your symptoms are not improving after 3 days of treatment.  You have increased discharge or pain.  You have a fever. MAKE SURE YOU:   Understand these instructions.  Will watch your condition.  Will get help right away if you are not doing well or get worse. FOR MORE INFORMATION  Centers for Disease Control and Prevention, Division of STD Prevention: SolutionApps.co.zawww.cdc.gov/std American Sexual Health Association (ASHA): www.ashastd.org  Document Released: 09/22/2005 Document Revised: 07/13/2013 Document Reviewed: 05/04/2013 [facility]Exit[facility] Patient Information 2015 [place], [place]. This information is not intended to replace advice given to you by your health care provider. Make sure you discuss any questions you have with your health care provider.

## 2015-04-12 ENCOUNTER — Encounter: Payer: Self-pay | Admitting: Physician Assistant

## 2015-04-12 LAB — URINALYSIS, MICROSCOPIC ONLY: Casts: NONE SEEN

## 2015-04-12 LAB — LIPID PANEL
Cholesterol: 181 mg/dL (ref 0–200)
HDL: 46 mg/dL (ref >=46)
LDL CALC: 86 mg/dL (ref 0–99)
TRIGLYCERIDES: 243 mg/dL — AB (ref <150)
Total CHOL/HDL Ratio: 3.9 Ratio
VLDL: 49 mg/dL — AB (ref 0–40)

## 2015-04-12 LAB — BASIC METABOLIC PANEL WITH GFR
BUN: 19 mg/dL (ref 6–23)
CO2: 24 mEq/L (ref 19–32)
Calcium: 9.7 mg/dL (ref 8.4–10.5)
Chloride: 103 mEq/L (ref 96–112)
Creat: 0.92 mg/dL (ref 0.50–1.10)
GFR, Est Non African American: 85 mL/min
Glucose, Bld: 99 mg/dL (ref 70–99)
Potassium: 4.5 mEq/L (ref 3.5–5.3)
SODIUM: 140 meq/L (ref 135–145)

## 2015-04-12 LAB — URINALYSIS, ROUTINE W REFLEX MICROSCOPIC
Bilirubin Urine: NEGATIVE
Glucose, UA: NEGATIVE mg/dL
KETONES UR: NEGATIVE mg/dL
Nitrite: NEGATIVE
PH: 5.5 (ref 5.0–8.0)
Protein, ur: NEGATIVE mg/dL
SPECIFIC GRAVITY, URINE: 1.03 (ref 1.005–1.030)
UROBILINOGEN UA: 0.2 mg/dL (ref 0.0–1.0)

## 2015-04-12 LAB — MAGNESIUM: Magnesium: 1.9 mg/dL (ref 1.5–2.5)

## 2015-04-12 LAB — HEPATIC FUNCTION PANEL
ALBUMIN: 4.1 g/dL (ref 3.5–5.2)
ALT: 15 U/L (ref 0–35)
AST: 16 U/L (ref 0–37)
Alkaline Phosphatase: 77 U/L (ref 39–117)
BILIRUBIN TOTAL: 0.5 mg/dL (ref 0.2–1.2)
Bilirubin, Direct: 0.1 mg/dL (ref 0.0–0.3)
Indirect Bilirubin: 0.4 mg/dL (ref 0.2–1.2)
TOTAL PROTEIN: 7.4 g/dL (ref 6.0–8.3)

## 2015-04-12 LAB — IRON AND TIBC
%SAT: 21 % (ref 20–55)
Iron: 89 ug/dL (ref 42–145)
TIBC: 421 ug/dL (ref 250–470)
UIBC: 332 ug/dL (ref 125–400)

## 2015-04-12 LAB — WET PREP BY MOLECULAR PROBE
CANDIDA SPECIES: NEGATIVE
Gardnerella vaginalis: NEGATIVE
Trichomonas vaginosis: NEGATIVE

## 2015-04-12 LAB — VITAMIN D 25 HYDROXY (VIT D DEFICIENCY, FRACTURES): Vit D, 25-Hydroxy: 77 ng/mL (ref 30–100)

## 2015-04-12 LAB — FERRITIN: FERRITIN: 59 ng/mL (ref 10–291)

## 2015-04-12 LAB — TSH: TSH: 1.303 u[IU]/mL (ref 0.350–4.500)

## 2015-04-12 LAB — INSULIN, FASTING: Insulin fasting, serum: 89.8 u[IU]/mL — ABNORMAL HIGH (ref 2.0–19.6)

## 2015-04-12 LAB — VITAMIN B12: Vitamin B-12: 548 pg/mL (ref 211–911)

## 2015-04-13 LAB — URINE CULTURE

## 2015-04-16 ENCOUNTER — Encounter: Payer: Self-pay | Admitting: Physician Assistant

## 2015-04-18 ENCOUNTER — Encounter: Payer: Self-pay | Admitting: Physician Assistant

## 2015-04-18 ENCOUNTER — Ambulatory Visit: Payer: Self-pay | Admitting: Physician Assistant

## 2015-05-10 ENCOUNTER — Other Ambulatory Visit: Payer: Self-pay | Admitting: Physician Assistant

## 2015-05-10 ENCOUNTER — Encounter: Payer: Self-pay | Admitting: Physician Assistant

## 2015-05-10 MED ORDER — AMPHETAMINE-DEXTROAMPHETAMINE 10 MG PO TABS: 10.0000 mg | ORAL_TABLET | Freq: Two times a day (BID) | ORAL | Status: DC

## 2015-05-17 ENCOUNTER — Ambulatory Visit (INDEPENDENT_AMBULATORY_CARE_PROVIDER_SITE_OTHER): Payer: BLUE CROSS/BLUE SHIELD | Admitting: Physician Assistant

## 2015-05-17 ENCOUNTER — Encounter: Payer: Self-pay | Admitting: Physician Assistant

## 2015-05-17 VITALS — BP 112/74 | HR 68 | Temp 98.6°F | Resp 16 | Ht 64.0 in | Wt 202.8 lb

## 2015-05-17 DIAGNOSIS — J029 Acute pharyngitis, unspecified: Secondary | ICD-10-CM

## 2015-05-17 MED ORDER — AZITHROMYCIN 250 MG PO TABS: ORAL_TABLET | ORAL | Status: DC

## 2015-05-17 NOTE — Patient Instructions (Signed)
-  Make sure you are drinking plenty of fluids to stay hydrated.  -while drinking fluids pinch and hold nose close and swallow, to help open eustachian tubes and drain fluid behind ear drums. -you can do salt water gargles. You can also do 1 TSP liquid Maalox and 1 TSP liquid benadryl- mix/ gargle/ spit  If you are not feeling better in 10-14 days, then please call the office.  Pharyngitis Pharyngitis is redness, pain, and swelling (inflammation) of your pharynx.  CAUSES  Pharyngitis is usually caused by infection. Most of the time, these infections are from viruses (viral) and are part of a cold. However, sometimes pharyngitis is caused by bacteria (bacterial). Pharyngitis can also be caused by allergies. Viral pharyngitis may be spread from person to person by coughing, sneezing, and personal items or utensils (cups, forks, spoons, toothbrushes). Bacterial pharyngitis may be spread from person to person by more intimate contact, such as kissing.  SIGNS AND SYMPTOMS  Symptoms of pharyngitis include:   Sore throat.   Tiredness (fatigue).   Low-grade fever.   Headache.  Joint pain and muscle aches.  Skin rashes.  Swollen lymph nodes.  Plaque-like film on throat or tonsils (often seen with bacterial pharyngitis). DIAGNOSIS  Your health care provider will ask you questions about your illness and your symptoms. Your medical history, along with a physical exam, is often all that is needed to diagnose pharyngitis. Sometimes, a rapid strep test is done. Other lab tests may also be done, depending on the suspected cause.  TREATMENT  Viral pharyngitis will usually get better in 3-4 days without the use of medicine. Bacterial pharyngitis is treated with medicines that kill germs (antibiotics).  HOME CARE INSTRUCTIONS   Drink enough water and fluids to keep your urine clear or pale yellow.   Only take over-the-counter or prescription medicines as directed by your health care  provider:   If you are prescribed antibiotics, make sure you finish them even if you start to feel better.   Do not take aspirin.   Get lots of rest.   Gargle with 8 oz of salt water ( tsp of salt per 1 qt of water) as often as every 1-2 hours to soothe your throat.   Throat lozenges (if you are not at risk for choking) or sprays may be used to soothe your throat. SEEK MEDICAL CARE IF:   You have large, tender lumps in your neck.  You have a rash.  You cough up green, yellow-brown, or bloody spit. SEEK IMMEDIATE MEDICAL CARE IF:   Your neck becomes stiff.  You drool or are unable to swallow liquids.  You vomit or are unable to keep medicines or liquids down.  You have severe pain that does not go away with the use of recommended medicines.  You have trouble breathing (not caused by a stuffy nose). MAKE SURE YOU:   Understand these instructions.  Will watch your condition.  Will get help right away if you are not doing well or get worse. Document Released: 09/22/2005 Document Revised: 07/13/2013 Document Reviewed: 05/30/2013 [employer] Patient Information 2015 [employer]. This information is not intended to replace advice given to you by your health care provider. Make sure you discuss any questions you have with your health care provider.  

## 2015-05-17 NOTE — Progress Notes (Signed)
Subjective:    Patient ID: Eileen Rios, female    DOB: 01-Feb-1986, 29 y.o.   MRN: [id number]  HPI 29 y.o. WF with history of sinusitis, allergies presents with sore throat. Has had 4 days of sore throat, sinus congestion, but woke up this AM feeling worse and worse sore throat. Has had strep throat contact and wants to make sure it is not that. Using flonase, zyrtec.   Blood pressure 112/74, pulse 68, temperature 98.6 F (37 C), resp. rate 16, height  (1.626 m), weight 202 lb 12.8 oz (91.989 kg).  Current Outpatient Prescriptions on File Prior to Visit  Medication Sig Dispense Refill  . ALPRAZolam (XANAX) 1 MG tablet TAKE ONE-HALF TO ONE TABLET TWICE DAILY 60 tablet 1  . amphetamine-dextroamphetamine (ADDERALL) 10 MG tablet Take 1 tablet (10 mg total) by mouth 2 (two) times daily with a meal. 60 tablet 0  . atenolol (TENORMIN) 50 MG tablet TAKE ONE TABLET AT BEDTIME 30 tablet 3  . azelastine (ASTELIN) 0.1 % nasal spray Place 2 sprays into both nostrils 2 (two) times daily. Use in each nostril as directed 30 mL 2  . bifidobacterium infantis (ALIGN) capsule Take 1 capsule by mouth daily.    . Biotin 10 MG TABS Take by mouth daily.    . cetirizine (ZYRTEC) 10 MG tablet Take 10 mg by mouth daily as needed for allergies.    Marland Kitchen escitalopram (LEXAPRO) 20 MG tablet Take 1 tablet (20 mg total) by mouth daily. 30 tablet 2  . fluticasone (FLONASE) 50 MCG/ACT nasal spray Use 1 to 2  nasal sprays to each nostril daily 16 g 99  . hyoscyamine (LEVSIN/SL) 0.125 MG SL tablet Place 1 tablet (0.125 mg total) under the tongue every 6 (six) hours as needed. For abdominal cramping 30 tablet 0  . ibuprofen (ADVIL,MOTRIN) 200 MG tablet Take 400-600 mg by mouth every 6 (six) hours as needed for headache.    . Multiple Vitamins-Calcium (ONE-A-DAY WOMENS FORMULA PO) Take by mouth daily.    . Norethindrone Acetate-Ethinyl Estrad-FE (LOESTRIN 24 FE) 1-20 MG-MCG(24) tablet   5  . omeprazole (PRILOSEC) 40 MG  capsule Take 1 capsule (40 mg total) by mouth daily. 30 capsule 1  . ondansetron (ZOFRAN) 4 MG tablet Take 1 tablet (4 mg total) by mouth daily as needed for nausea or vomiting. 30 tablet 2  . Suvorexant (BELSOMRA) 20 MG TABS Take 20 mg by mouth at bedtime as needed.    . [DISCONTINUED] Drospirenone-Ethinyl Estradiol-Levomefol (SAFYRAL) 3-0.03-0.451 MG tablet Take 1 tablet by mouth daily. 1 Package 3   No current facility-administered medications on file prior to visit.   Past Medical History  Diagnosis Date  . Hypertension   . Headache(784.0)     migraines  . IBS (irritable bowel syndrome)   . Seasonal allergies   . Insulin resistance   . Anxiety   . Depression    Review of Systems  Constitutional: Positive for fatigue. Negative for chills and diaphoresis.  HENT: Positive for congestion, ear pain, sinus pressure and sore throat. Negative for sneezing.   Respiratory: Negative.  Negative for cough and shortness of breath.   Cardiovascular: Negative.   Musculoskeletal: Negative for neck pain.  Neurological: Negative.  Negative for headaches.       Objective:   Physical Exam  Constitutional: She appears well-developed and well-nourished.  HENT:  Head: Normocephalic and atraumatic.  Right Ear: External ear normal.  Left Ear: External ear normal.  Mouth/Throat: Uvula is  midline and mucous membranes are normal. Posterior oropharyngeal edema and posterior oropharyngeal erythema present.  Eyes: Conjunctivae and EOM are normal. Pupils are equal, round, and reactive to light.  Neck: Normal range of motion. Neck supple.  Cardiovascular: Normal rate, regular rhythm and normal heart sounds.   Pulmonary/Chest: Effort normal and breath sounds normal.  Abdominal: Soft. Bowel sounds are normal.  Lymphadenopathy:    She has cervical adenopathy.  Skin: Skin is warm and dry.      Assessment & Plan:  Pharyngitis: take medicationss as prescribed, increase fluids,  Salt water gargles. If  symptoms do not improve in 5-7 days or get worse contact the office or go to ER.

## 2015-05-18 ENCOUNTER — Encounter: Payer: Self-pay | Admitting: Physician Assistant

## 2015-05-19 ENCOUNTER — Other Ambulatory Visit: Payer: Self-pay | Admitting: Internal Medicine

## 2015-05-19 DIAGNOSIS — J0101 Acute recurrent maxillary sinusitis: Secondary | ICD-10-CM

## 2015-05-19 MED ORDER — PREDNISONE 10 MG PO TABS: ORAL_TABLET | ORAL | Status: DC

## 2015-05-21 ENCOUNTER — Ambulatory Visit (INDEPENDENT_AMBULATORY_CARE_PROVIDER_SITE_OTHER): Payer: BLUE CROSS/BLUE SHIELD | Admitting: Physician Assistant

## 2015-05-21 ENCOUNTER — Encounter: Payer: Self-pay | Admitting: Physician Assistant

## 2015-05-21 VITALS — BP 132/84 | HR 72 | Temp 97.7°F | Resp 16 | Ht 64.0 in | Wt 199.2 lb

## 2015-05-21 DIAGNOSIS — J069 Acute upper respiratory infection, unspecified: Secondary | ICD-10-CM | POA: Diagnosis not present

## 2015-05-21 MED ORDER — HYDROCODONE-ACETAMINOPHEN 5-325 MG PO TABS: 1.0000 | ORAL_TABLET | Freq: Four times a day (QID) | ORAL | Status: DC | PRN

## 2015-05-21 MED ORDER — LEVOFLOXACIN 500 MG PO TABS
500.0000 mg | ORAL_TABLET | Freq: Every day | ORAL | Status: DC
Start: 2015-05-21 — End: 2015-07-16

## 2015-05-21 NOTE — Progress Notes (Signed)
Subjective:    Patient ID: Eileen Rios, female    DOB: 09/10/1986, 29 y.o.   MRN: [id number]  HPI 29 y.o. WF with history of sinusitis, allergies presents for follow up, she was seen last week and given zpak, last day tomorrow, has gone from her sinuses to her chest. Still has sore throat, sinus congestion, now with cough with brown mucus,  and her ears.  Using flonase, zyrtec.   Blood pressure 132/84, pulse 72, temperature 97.7 F (36.5 C), resp. rate 16, height 5\' 4"  (1.626 m), weight 199 lb 3.2 oz (90.357 kg).  Current Outpatient Prescriptions on File Prior to Visit  Medication Sig Dispense Refill  . ALPRAZolam (XANAX) 1 MG tablet TAKE ONE-HALF TO ONE TABLET TWICE DAILY 60 tablet 1  . amphetamine-dextroamphetamine (ADDERALL) 10 MG tablet Take 1 tablet (10 mg total) by mouth 2 (two) times daily with a meal. 60 tablet 0  . atenolol (TENORMIN) 50 MG tablet TAKE ONE TABLET AT BEDTIME 30 tablet 3  . azelastine (ASTELIN) 0.1 % nasal spray Place 2 sprays into both nostrils 2 (two) times daily. Use in each nostril as directed 30 mL 2  . azithromycin (ZITHROMAX) 250 MG tablet Take 2 tablets (500 mg) on  Day 1,  followed by 1 tablet (250 mg) once daily on Days 2 through 5. 6 each 1  . bifidobacterium infantis (ALIGN) capsule Take 1 capsule by mouth daily.    . Biotin 10 MG TABS Take by mouth daily.    . cetirizine (ZYRTEC) 10 MG tablet Take 10 mg by mouth daily as needed for allergies.    Marland Kitchen escitalopram (LEXAPRO) 20 MG tablet Take 1 tablet (20 mg total) by mouth daily. 30 tablet 2  . fluticasone (FLONASE) 50 MCG/ACT nasal spray Use 1 to 2  nasal sprays to each nostril daily 16 g 99  . hyoscyamine (LEVSIN/SL) 0.125 MG SL tablet Place 1 tablet (0.125 mg total) under the tongue every 6 (six) hours as needed. For abdominal cramping 30 tablet 0  . ibuprofen (ADVIL,MOTRIN) 200 MG tablet Take 400-600 mg by mouth every 6 (six) hours as needed for headache.    . Multiple Vitamins-Calcium (ONE-A-DAY  WOMENS FORMULA PO) Take by mouth daily.    . Norethindrone Acetate-Ethinyl Estrad-FE (LOESTRIN 24 FE) 1-20 MG-MCG(24) tablet   5  . omeprazole (PRILOSEC) 40 MG capsule Take 1 capsule (40 mg total) by mouth daily. 30 capsule 1  . ondansetron (ZOFRAN) 4 MG tablet Take 1 tablet (4 mg total) by mouth daily as needed for nausea or vomiting. 30 tablet 2  . predniSONE (DELTASONE) 10 MG tablet 1 tab 3 x day for 2 days, then 1 tab 2 x day for 2 days, then 1 tab 1 x day for 3 days 13 tablet 0  . Suvorexant (BELSOMRA) 20 MG TABS Take 20 mg by mouth at bedtime as needed.    . [DISCONTINUED] Drospirenone-Ethinyl Estradiol-Levomefol (SAFYRAL) 3-0.03-0.451 MG tablet Take 1 tablet by mouth daily. 1 Package 3   No current facility-administered medications on file prior to visit.   Past Medical History  Diagnosis Date  . Hypertension   . Headache(784.0)     migraines  . IBS (irritable bowel syndrome)   . Seasonal allergies   . Insulin resistance   . Anxiety   . Depression    Review of Systems  Constitutional: Positive for fever, chills and fatigue. Negative for diaphoresis.  HENT: Positive for congestion, ear pain and sinus pressure. Negative for sneezing.  Respiratory: Positive for cough and wheezing. Negative for apnea, choking, chest tightness, shortness of breath and stridor.   Cardiovascular: Negative.   Neurological: Negative.        Objective:   Physical Exam  Constitutional: She appears well-developed and well-nourished.  HENT:  Head: Normocephalic and atraumatic.  Right Ear: External ear normal.  Left Ear: External ear normal.  Mouth/Throat: Uvula is midline and mucous membranes are normal. Posterior oropharyngeal edema and posterior oropharyngeal erythema present.  Eyes: Conjunctivae and EOM are normal. Pupils are equal, round, and reactive to light.  Neck: Normal range of motion. Neck supple.  Cardiovascular: Normal rate, regular rhythm and normal heart sounds.   Pulmonary/Chest:  Effort normal. She has wheezes (RLL).  Abdominal: Soft. Bowel sounds are normal.  Lymphadenopathy:    She has no cervical adenopathy.  Skin: Skin is warm and dry.      Assessment & Plan:  URI- Discussed diagnosis and treatment of URI. Suggested symptomatic OTC remedies. Nasal saline spray for congestion. Follow up as needed. Call in 5 days if symptoms aren't resolving.  Levaquin sent in

## 2015-05-21 NOTE — Patient Instructions (Signed)
I will give you a prescription for an antibiotic, but please only take it if you are not feeling better in 7-10 days.  Bronchitis is mostly caused by viruses and the antibiotic will do nothing.  PLEASE TRY TO DO OVER THE COUNTER TREATMENT AND/OR PREDNISONE FOR 5-7 DAYS AND IF YOU ARE NOT GETTING BETTER OR GETTING WORSE THEN YOU CAN START ON AN ANTIBIOTIC GIVEN.  Can take the prednisone AT NIGHT WITH DINNER, it take 8-12 hours to start working so it will NOT affect your sleeping if you take it at night with your food!! Take two pills the first night and 1 or two pill the second night and then 1 pill the other nights.    Rest and stay hydrated.  Make sure you drink plenty of fluids to make sure urine is clear when you urinate.  Water will help thin out mucous. - Take Mucinex DM- Maximum Strength over the counter to thin out and cough up the thick mucous.  Please follow directions on box. -Take Albuterol if prescribed.  Risk of antibiotic use: About 1 in 4 people who take antibiotics have side effects including stomach problems, dizziness, or rashes. Those problems clear up soon after stopping the drugs, but in rare cases antibiotics can cause severe allergic reaction. Over use of antibiotics also encourages the growth of bacteria that can't be controlled easily with drugs. That makes you more vunerable to antibiotic-resistant infections and undermines the benefits of antibiotics for others.   Waste of Money: Antibiotics often aren't very expensive, but any money spent on unnecessary drugs is money down the drain.   When are antibiotics needed? Only when symptoms last longer than a week.  Start to improve but then worsen again  Please call the office or message through My Chart if you have any questions.   Acute Bronchitis Bronchitis is when the airways that extend from the windpipe into the lungs get red, puffy, and painful (inflamed). Bronchitis often causes thick spit (mucus) to develop. This  leads to a cough. A cough is the most common symptom of bronchitis. In acute bronchitis, the condition usually begins suddenly and goes away over time (usually in 2 weeks). Smoking, allergies, and asthma can make bronchitis worse. Repeated episodes of bronchitis may cause more lung problems.  Most common cause of Bronchitis is viruses (rhinovirus, coronavirus, RSV).  Therefore, not requiring an antibiotic; as antibiotics only treat bacterial infections.  HOME CARE  Rest.  Drink enough fluids to keep your pee (urine) clear or pale yellow (unless you need to limit fluids as told by your doctor).  Only take over-the-counter or prescription medicines as told by your doctor.  Avoid smoking and secondhand smoke. These can make bronchitis worse. If you are a smoker, think about using nicotine gum or skin patches. Quitting smoking will help your lungs heal faster.  Reduce the chance of getting bronchitis again by:  Washing your hands often.  Avoiding people with cold symptoms.  Trying not to touch your hands to your mouth, nose, or eyes.  Follow up with your doctor as told. GET HELP IF: Your symptoms do not improve after 1 week of treatment. Symptoms include:  Cough.  Fever.  Coughing up thick spit.  Body aches.  Chest congestion.  Chills.  Shortness of breath.  Sore throat. GET HELP RIGHT AWAY IF:   You have an increased fever.  You have chills.  You have severe shortness of breath.  You have bloody thick spit (sputum).    You throw up (vomit) often.  You lose too much body fluid (dehydration).  You have a severe headache.  You faint. MAKE SURE YOU:   Understand these instructions.  Will watch your condition.  Will get help right away if you are not doing well or get worse. Document Released: 03/10/2008 Document Revised: 05/25/2013 Document Reviewed: 03/15/2013 Exit[employer] Patient Information 2015 [employer]. This information is not intended to replace  advice given to you by your health care provider. Make sure you discuss any questions you have with your health care provider.   

## 2015-06-05 ENCOUNTER — Other Ambulatory Visit: Payer: Self-pay | Admitting: Physician Assistant

## 2015-06-05 ENCOUNTER — Encounter: Payer: Self-pay | Admitting: Internal Medicine

## 2015-06-05 DIAGNOSIS — K219 Gastro-esophageal reflux disease without esophagitis: Secondary | ICD-10-CM

## 2015-06-05 MED ORDER — OMEPRAZOLE 40 MG PO CPDR: 40.0000 mg | DELAYED_RELEASE_CAPSULE | Freq: Every day | ORAL | Status: DC

## 2015-06-05 MED ORDER — ONDANSETRON HCL 4 MG PO TABS: 4.0000 mg | ORAL_TABLET | Freq: Every day | ORAL | Status: DC | PRN

## 2015-06-08 ENCOUNTER — Other Ambulatory Visit: Payer: Self-pay | Admitting: Physician Assistant

## 2015-06-08 NOTE — Telephone Encounter (Signed)
Rx called in 

## 2015-06-15 ENCOUNTER — Other Ambulatory Visit: Payer: Self-pay | Admitting: Internal Medicine

## 2015-06-17 ENCOUNTER — Encounter: Payer: Self-pay | Admitting: Physician Assistant

## 2015-06-18 ENCOUNTER — Other Ambulatory Visit: Payer: Self-pay | Admitting: Physician Assistant

## 2015-06-18 ENCOUNTER — Encounter: Payer: Self-pay | Admitting: Physician Assistant

## 2015-06-18 MED ORDER — SUVOREXANT 20 MG PO TABS: 20.0000 mg | ORAL_TABLET | Freq: Every evening | ORAL | Status: DC | PRN

## 2015-06-19 ENCOUNTER — Encounter: Payer: Self-pay | Admitting: Physician Assistant

## 2015-07-07 ENCOUNTER — Encounter: Payer: Self-pay | Admitting: Physician Assistant

## 2015-07-08 ENCOUNTER — Other Ambulatory Visit: Payer: Self-pay | Admitting: Physician Assistant

## 2015-07-08 MED ORDER — PREDNISONE 20 MG PO TABS: ORAL_TABLET | ORAL | Status: DC

## 2015-07-12 ENCOUNTER — Ambulatory Visit: Payer: Self-pay | Admitting: Physician Assistant

## 2015-07-16 ENCOUNTER — Ambulatory Visit (INDEPENDENT_AMBULATORY_CARE_PROVIDER_SITE_OTHER): Payer: BLUE CROSS/BLUE SHIELD | Admitting: Physician Assistant

## 2015-07-16 ENCOUNTER — Encounter: Payer: Self-pay | Admitting: Physician Assistant

## 2015-07-16 VITALS — BP 124/80 | HR 100 | Temp 97.5°F | Resp 16 | Ht 64.0 in | Wt 202.0 lb

## 2015-07-16 DIAGNOSIS — E669 Obesity, unspecified: Secondary | ICD-10-CM

## 2015-07-16 DIAGNOSIS — F411 Generalized anxiety disorder: Secondary | ICD-10-CM | POA: Diagnosis not present

## 2015-07-16 DIAGNOSIS — R51 Headache: Secondary | ICD-10-CM | POA: Diagnosis not present

## 2015-07-16 DIAGNOSIS — R519 Headache, unspecified: Secondary | ICD-10-CM

## 2015-07-16 DIAGNOSIS — E8881 Metabolic syndrome: Secondary | ICD-10-CM

## 2015-07-16 DIAGNOSIS — E559 Vitamin D deficiency, unspecified: Secondary | ICD-10-CM | POA: Diagnosis not present

## 2015-07-16 DIAGNOSIS — Z79899 Other long term (current) drug therapy: Secondary | ICD-10-CM | POA: Diagnosis not present

## 2015-07-16 DIAGNOSIS — O132 Gestational [pregnancy-induced] hypertension without significant proteinuria, second trimester: Secondary | ICD-10-CM

## 2015-07-16 LAB — CBC WITH DIFFERENTIAL/PLATELET
BASOS ABS: 0 10*3/uL (ref 0.0–0.1)
Basophils Relative: 0 % (ref 0–1)
Eosinophils Absolute: 0.2 10*3/uL (ref 0.0–0.7)
Eosinophils Relative: 2 % (ref 0–5)
HEMATOCRIT: 42.5 % (ref 36.0–46.0)
HEMOGLOBIN: 14.6 g/dL (ref 12.0–15.0)
LYMPHS PCT: 28 % (ref 12–46)
Lymphs Abs: 3 10*3/uL (ref 0.7–4.0)
MCH: 30.3 pg (ref 26.0–34.0)
MCHC: 34.4 g/dL (ref 30.0–36.0)
MCV: 88.2 fL (ref 78.0–100.0)
MPV: 9.1 fL (ref 8.6–12.4)
Monocytes Absolute: 0.7 10*3/uL (ref 0.1–1.0)
Monocytes Relative: 7 % (ref 3–12)
NEUTROS ABS: 6.7 10*3/uL (ref 1.7–7.7)
Neutrophils Relative %: 63 % (ref 43–77)
Platelets: 306 10*3/uL (ref 150–400)
RBC: 4.82 MIL/uL (ref 3.87–5.11)
RDW: 13.6 % (ref 11.5–15.5)
WBC: 10.6 10*3/uL — AB (ref 4.0–10.5)

## 2015-07-16 MED ORDER — AMPHETAMINE-DEXTROAMPHETAMINE 10 MG PO TABS: 10.0000 mg | ORAL_TABLET | Freq: Two times a day (BID) | ORAL | Status: DC

## 2015-07-16 MED ORDER — VILAZODONE HCL 40 MG PO TABS: 40.0000 mg | ORAL_TABLET | Freq: Every day | ORAL | Status: DC

## 2015-07-16 NOTE — Progress Notes (Signed)
Assessment and Plan:  Hypertension: Continue medication, monitor blood pressure at home. Continue DASH diet.  Reminder to go to the ER if any CP, SOB, nausea, dizziness, severe HA, changes vision/speech, left arm numbness and tingling, and jaw pain. Cholesterol: Continue diet and exercise. Check cholesterol.  Pre-diabetes-Continue diet and exercise. Check A1C Vitamin D Def- check level and continue medications.  Fatigue- better with adderall, continue belsomra as needed.  Anxiety- stop lexapro, try viibryd, follow up 1 month  Continue diet and meds as discussed. Further disposition pending results of labs.  HPI 29 y.o. female  presents for 3 month follow up with hypertension, hyperlipidemia, prediabetes and vitamin D.  Her blood pressure has been controlled at home, today their BP is BP: 124/80 mmHg  She does workout, 2-3 x a week. She denies chest pain, shortness of breath, dizziness.  She is not on cholesterol medication and denies myalgias. Her cholesterol is at goal. The cholesterol last visit was:  Lab Results  Component Value Date   CHOL 181 04/11/2015   HDL 46 04/11/2015   LDLCALC 86 04/11/2015   TRIG 243* 04/11/2015   CHOLHDL 3.9 04/11/2015  Last A1C in the office was:  Lab Results  Component Value Date   HGBA1C 5.3 11/07/2014  Patient is on Vitamin D supplement.   Lab Results  Component Value Date   VD25OH 77 04/11/2015   She has repeated sinus infections.  She has had a negative sleep study other than she was not hitting REM sleep, she was tried on nuvigil but could not tolerate it, and has been on adderall that has helped. She takes belsomra PRN for sleep PRN. HAs been on pristiq, lexapro, brintellix without help.  She has history of tension HA/TMJ- she takes xanax PRN for anxiety, on atenolol 50 at night.  BMI is Body mass index is 34.66 kg/(m^2)., she is working on diet and exercise. Wt Readings from Last 3 Encounters:  07/16/15 202 lb (91.627 kg)  05/21/15 199 lb 3.2  oz (90.357 kg)  05/17/15 202 lb 12.8 oz (91.989 kg)    Current Medications:  Current Outpatient Prescriptions on File Prior to Visit  Medication Sig Dispense Refill  . ALPRAZolam (XANAX) 1 MG tablet TAKE ONE TABLET TWICE DAILY AS NEEDED 60 tablet 0  . amphetamine-dextroamphetamine (ADDERALL) 10 MG tablet Take 1 tablet (10 mg total) by mouth 2 (two) times daily with a meal. 60 tablet 0  . atenolol (TENORMIN) 50 MG tablet TAKE ONE TABLET AT BEDTIME 90 tablet 1  . azelastine (ASTELIN) 0.1 % nasal spray Place 2 sprays into both nostrils 2 (two) times daily. Use in each nostril as directed 30 mL 2  . bifidobacterium infantis (ALIGN) capsule Take 1 capsule by mouth daily.    . Biotin 10 MG TABS Take by mouth daily.    . cetirizine (ZYRTEC) 10 MG tablet Take 10 mg by mouth daily as needed for allergies.    Marland Kitchen escitalopram (LEXAPRO) 20 MG tablet Take 1 tablet (20 mg total) by mouth daily. 30 tablet 2  . fluticasone (FLONASE) 50 MCG/ACT nasal spray Use 1 to 2  nasal sprays to each nostril daily 16 g 99  . HYDROcodone-acetaminophen (NORCO) 5-325 MG per tablet Take 1 tablet by mouth every 6 (six) hours as needed for moderate pain (for cough). 30 tablet 0  . hyoscyamine (LEVSIN/SL) 0.125 MG SL tablet Place 1 tablet (0.125 mg total) under the tongue every 6 (six) hours as needed. For abdominal cramping 30 tablet 0  .  ibuprofen (ADVIL,MOTRIN) 200 MG tablet Take 400-600 mg by mouth every 6 (six) hours as needed for headache.    . Multiple Vitamins-Calcium (ONE-A-DAY WOMENS FORMULA PO) Take by mouth daily.    . Norethindrone Acetate-Ethinyl Estrad-FE (LOESTRIN 24 FE) 1-20 MG-MCG(24) tablet   5  . omeprazole (PRILOSEC) 40 MG capsule Take 1 capsule (40 mg total) by mouth daily. 30 capsule 1  . ondansetron (ZOFRAN) 4 MG tablet Take 1 tablet (4 mg total) by mouth daily as needed for nausea or vomiting. 30 tablet 2  . Suvorexant (BELSOMRA) 20 MG TABS Take 20 mg by mouth at bedtime as needed. 30 tablet 4  .  [DISCONTINUED] Drospirenone-Ethinyl Estradiol-Levomefol (SAFYRAL) 3-0.03-0.451 MG tablet Take 1 tablet by mouth daily. 1 Package 3   No current facility-administered medications on file prior to visit.   Medical History:  Past Medical History  Diagnosis Date  . Hypertension   . Headache(784.0)     migraines  . IBS (irritable bowel syndrome)   . Seasonal allergies   . Insulin resistance   . Anxiety   . Depression    Allergies: No Known Allergies   Review of Systems:  Review of Systems  Constitutional: Positive for malaise/fatigue. Negative for fever, chills, weight loss and diaphoresis.  HENT: Positive for congestion. Negative for ear discharge, ear pain, hearing loss, nosebleeds, sore throat and tinnitus.   Eyes: Negative.  Negative for blurred vision and double vision.  Respiratory: Negative.  Negative for stridor.   Cardiovascular: Negative.   Gastrointestinal: Positive for heartburn. Negative for nausea, vomiting, abdominal pain, diarrhea, constipation, blood in stool and melena.  Genitourinary: Negative.   Musculoskeletal: Positive for myalgias and joint pain. Negative for back pain, falls and neck pain.  Skin: Negative.  Negative for rash.  Neurological: Positive for headaches. Negative for dizziness, tingling, tremors, sensory change, speech change, focal weakness, seizures, loss of consciousness and weakness.  Psychiatric/Behavioral: Negative for depression, suicidal ideas, hallucinations, memory loss and substance abuse. The patient is nervous/anxious and has insomnia.     Family history- Review and unchanged Social history- Review and unchanged Physical Exam: BP 124/80 mmHg  Pulse 100  Temp(Src) 97.5 F (36.4 C) (Temporal)  Resp 16  Ht  (1.626 m)  Wt 202 lb (91.627 kg)  BMI 34.66 kg/m2  SpO2 95% Wt Readings from Last 3 Encounters:  07/16/15 202 lb (91.627 kg)  05/21/15 199 lb 3.2 oz (90.357 kg)  05/17/15 202 lb 12.8 oz (91.989 kg)   General Appearance:  Well nourished, in no apparent distress. Eyes: PERRLA, EOMs, conjunctiva no swelling or erythema Sinuses: No Frontal/maxillary tenderness ENT/Mouth: Ext aud canals clear, TMs without erythema, bulging. No erythema, swelling, or exudate on post pharynx.  Tonsils not swollen or erythematous. Hearing normal.  Neck: Supple, thyroid normal.  Respiratory: Respiratory effort normal, BS equal bilaterally without rales, rhonchi, wheezing or stridor.  Cardio: RRR with no MRGs. Brisk peripheral pulses without edema.  Abdomen: Soft, + BS,  Non tender, no guarding, rebound, hernias, masses. Lymphatics: Non tender without lymphadenopathy.  Musculoskeletal: Full ROM, 5/5 strength, Normal gait Skin: Warm, dry without rashes, lesions, ecchymosis.  Neuro: Cranial nerves intact. Normal muscle tone, no cerebellar symptoms. Psych: Awake and oriented X 3, normal affect, Insight and Judgment appropriate.    Quentin Mulling, PA-C 4:15 PM [facility]

## 2015-07-16 NOTE — Patient Instructions (Addendum)
We want weight loss that will last so you should lose 1-2 pounds a week.  THAT IS IT! Please pick THREE things a month to change. Once it is a habit check off the item. Then pick another three items off the list to become habits.  If you are already doing a habit on the list GREAT!  Cross that item off! o Don't drink your calories. Ie, alcohol, soda, fruit juice, and sweet tea.  o Drink more water. Drink a glass when you feel hungry or before each meal.  o Eat breakfast - Complex carb and protein (likeDannon light and fit yogurt, oatmeal, fruit, eggs, turkey bacon). o Measure your cereal.  Eat no more than one cup a day. (ie Kashi) o Eat an apple a day. o Add a vegetable a day. o Try a new vegetable a month. o Use Pam! Stop using oil or butter to cook. o Don't finish your plate or use smaller plates. o Share your dessert. o Eat sugar free Jello for dessert or frozen grapes. o Don't eat 2-3 hours before bed. o Switch to whole wheat bread, pasta, and brown rice. o Make healthier choices when you eat out. No fries! o Pick baked chicken, NOT fried. o Don't forget to SLOW DOWN when you eat. It is not going anywhere.  o Take the stairs. o Park far away in the parking lot o Lift soup cans (or weights) for 10 minutes while watching TV. o Walk at work for 10 minutes during break. o Walk outside 1 time a week with your friend, kids, dog, or significant other. o Start a walking group at church. o Walk the mall as much as you can tolerate.  o Keep a food diary. o Weigh yourself daily. o Walk for 15 minutes 3 days per week. o Cook at home more often and eat out less.  If life happens and you go back to old habits, it is okay.  Just start over. You can do it!   If you experience chest pain, get short of breath, or tired during the exercise, please stop immediately and inform your doctor.   Before you even begin to attack a weight-loss plan, it pays to remember this: You are not fat. You have fat.  Losing weight isn't about blame or shame; it's simply another achievement to accomplish. Dieting is like any other skill-you have to buckle down and work at it. As long as you act in a smart, reasonable way, you'll ultimately get where you want to be. Here are some weight loss pearls for you.  1. It's Not a Diet. It's a Lifestyle Thinking of a diet as something you're on and suffering through only for the short term doesn't work. To shed weight and keep it off, you need to make permanent changes to the way you eat. It's OK to indulge occasionally, of course, but if you cut calories temporarily and then revert to your old way of eating, you'll gain back the weight quicker than you can say yo-yo. Use it to lose it. Research shows that one of the best predictors of long-term weight loss is how many pounds you drop in the first month. For that reason, nutritionists often suggest being stricter for the first two weeks of your new eating strategy to build momentum. Cut out added sugar and alcohol and avoid unrefined carbs. After that, figure out how you can reincorporate them in a way that's healthy and maintainable.  2. There's a Right   Way to Exercise Working out burns calories and fat and boosts your metabolism by building muscle. But those trying to lose weight are notorious for overestimating the number of calories they burn and underestimating the amount they take in. Unfortunately, your system is biologically programmed to hold on to extra pounds and that means when you start exercising, your body senses the deficit and ramps up its hunger signals. If you're not diligent, you'll eat everything you burn and then some. Use it to lose it. Cardio gets all the exercise glory, but strength and interval training are the real heroes. They help you build lean muscle, which in turn increases your metabolism and calorie-burning ability 3. Don't Overreact to Mild Hunger Some people have a hard time losing weight because  of hunger anxiety. To them, being hungry is bad-something to be avoided at all costs-so they carry snacks with them and eat when they don't need to. Others eat because they're stressed out or bored. While you never want to get to the point of being ravenous (that's when bingeing is likely to happen), a hunger pang, a craving, or the fact that it's 3:00 p.m. should not send you racing for the vending machine or obsessing about the energy bar in your purse. Ideally, you should put off eating until your stomach is growling and it's difficult to concentrate.  Use it to lose it. When you feel the urge to eat, use the HALT method. Ask yourself, Am I really hungry? Or am I angry or anxious, lonely or bored, or tired? If you're still not certain, try the apple test. If you're truly hungry, an apple should seem delicious; if it doesn't, something else is going on. Or you can try drinking water and making yourself busy, if you are still hungry try a healthy snack.  4. Not All Calories Are Created Equal The mechanics of weight loss are pretty simple: Take in fewer calories than you use for energy. But the kind of food you eat makes all the difference. Processed food that's high in saturated fat and refined starch or sugar can cause inflammation that disrupts the hormone signals that tell your brain you're full. The result: You eat a lot more.  Use it to lose it. Clean up your diet. Swap in whole, unprocessed foods, including vegetables, lean protein, and healthy fats that will fill you up and give you the biggest nutritional bang for your calorie buck. In a few weeks, as your brain starts receiving regular hunger and fullness signals once again, you'll notice that you feel less hungry overall and naturally start cutting back on the amount you eat.  5. Protein, Produce, and Plant-Based Fats Are Your Weight-Loss Trinity Here's why eating the three Ps regularly will help you drop pounds. Protein fills you up. You need it  to build lean muscle, which keeps your metabolism humming so that you can torch more fat. People in a weight-loss program who ate double the recommended daily allowance for protein (about 110 grams for a 150-pound woman) lost 70 percent of their weight from fat, while people who ate the RDA lost only about 40 percent, one study found. Produce is packed with filling fiber. "It's very difficult to consume too many calories if you're eating a lot of vegetables. Example: Three cups of broccoli is a lot of food, yet only 93 calories. (Fruit is another story. It can be easy to overeat and can contain a lot of calories from sugar, so be sure to   monitor your intake.) Plant-based fats like olive oil and those in avocados and nuts are healthy and extra satiating.  Use it to lose it. Aim to incorporate each of the three Ps into every meal and snack. People who eat protein throughout the day are able to keep weight off, according to a study in the American Journal of Clinical Nutrition. In addition to meat, poultry and seafood, good sources are beans, lentils, eggs, tofu, and yogurt. As for fat, keep portion sizes in check by measuring out salad dressing, oil, and nut butters (shoot for one to two tablespoons). Finally, eat veggies or a little fruit at every meal. People who did that consumed 308 fewer calories but didn't feel any hungrier than when they didn't eat more produce.  7. How You Eat Is As Important As What You Eat In order for your brain to register that you're full, you need to focus on what you're eating. Sit down whenever you eat, preferably at a table. Turn off the TV or computer, put down your phone, and look at your food. Smell it. Chew slowly, and don't put another bite on your fork until you swallow. When women ate lunch this attentively, they consumed 30 percent less when snacking later than those who listened to an audiobook at lunchtime, according to a study in the British Journal of Nutrition. 8.  Weighing Yourself Really Works The scale provides the best evidence about whether your efforts are paying off. Seeing the numbers tick up or down or stagnate is motivation to keep going-or to rethink your approach. A 2015 study at [employer] found that daily weigh-ins helped people lose more weight, keep it off, and maintain that loss, even after two years. Use it to lose it. Step on the scale at the same time every day for the best results. If your weight shoots up several pounds from one weigh-in to the next, don't freak out. Eating a lot of salt the night before or having your period is the likely culprit. The number should return to normal in a day or two. It's a steady climb that you need to do something about. 9. Too Much Stress and Too Little Sleep Are Your Enemies When you're tired and frazzled, your body cranks up the production of cortisol, the stress hormone that can cause carb cravings. Not getting enough sleep also boosts your levels of ghrelin, a hormone associated with hunger, while suppressing leptin, a hormone that signals fullness and satiety. People on a diet who slept only five and a half hours a night for two weeks lost 55 percent less fat and were hungrier than those who slept eight and a half hours, according to a study in the [employer] Journal. Use it to lose it. Prioritize sleep, aiming for seven hours or more a night, which research shows helps lower stress. And make sure you're getting quality zzz's. If a snoring spouse or a fidgety cat wakes you up frequently throughout the night, you may end up getting the equivalent of just four hours of sleep, according to a study from [employer]. Keep pets out of the bedroom, and use a white-noise app to drown out snoring. 10. You Will Hit a plateau-And You Can Bust Through It As you slim down, your body releases much less leptin, the fullness hormone.  If you're not strength training, start right now.  Building muscle can raise your metabolism to help you overcome a plateau. To keep your body challenged   and burning calories, incorporate new moves and more intense intervals into your workouts or add another sweat session to your weekly routine. Alternatively, cut an extra 100 calories or so a day from your diet. Now that you've lost weight, your body simply doesn't need as much fuel.    Recommend that you see [facility].  Address: [address] [place], [place] [address] [phone]

## 2015-07-17 LAB — HEMOGLOBIN A1C
HEMOGLOBIN A1C: 5.5 % (ref <5.7)
MEAN PLASMA GLUCOSE: 111 mg/dL (ref <117)

## 2015-07-17 LAB — HEPATIC FUNCTION PANEL
ALT: 25 U/L (ref 6–29)
AST: 21 U/L (ref 10–30)
Albumin: 4 g/dL (ref 3.6–5.1)
Alkaline Phosphatase: 82 U/L (ref 33–115)
BILIRUBIN DIRECT: 0.1 mg/dL (ref <=0.2)
BILIRUBIN INDIRECT: 0.4 mg/dL (ref 0.2–1.2)
BILIRUBIN TOTAL: 0.5 mg/dL (ref 0.2–1.2)
Total Protein: 7.4 g/dL (ref 6.1–8.1)

## 2015-07-17 LAB — LIPID PANEL
CHOL/HDL RATIO: 4.1 ratio (ref <=5.0)
Cholesterol: 195 mg/dL (ref 125–200)
HDL: 48 mg/dL (ref >=46)
LDL CALC: 104 mg/dL (ref <130)
TRIGLYCERIDES: 217 mg/dL — AB (ref <150)
VLDL: 43 mg/dL — AB (ref <30)

## 2015-07-17 LAB — BASIC METABOLIC PANEL WITH GFR
BUN: 14 mg/dL (ref 7–25)
CALCIUM: 10.5 mg/dL — AB (ref 8.6–10.2)
CHLORIDE: 98 mmol/L (ref 98–110)
CO2: 32 mmol/L — AB (ref 20–31)
Creat: 0.89 mg/dL (ref 0.50–1.10)
GFR, EST NON AFRICAN AMERICAN: 88 mL/min (ref >=60)
GFR, Est African American: >89 mL/min (ref >=60)
Glucose, Bld: 94 mg/dL (ref 65–99)
Potassium: 4.7 mmol/L (ref 3.5–5.3)
SODIUM: 136 mmol/L (ref 135–146)

## 2015-07-17 LAB — VITAMIN D 25 HYDROXY (VIT D DEFICIENCY, FRACTURES): VIT D 25 HYDROXY: 71 ng/mL (ref 30–100)

## 2015-07-17 LAB — INSULIN, FASTING: Insulin fasting, serum: 100.6 u[IU]/mL — ABNORMAL HIGH (ref 2.0–19.6)

## 2015-07-17 LAB — MAGNESIUM: Magnesium: 2 mg/dL (ref 1.5–2.5)

## 2015-07-17 LAB — TSH: TSH: 1.17 u[IU]/mL (ref 0.350–4.500)

## 2015-07-18 ENCOUNTER — Encounter: Payer: Self-pay | Admitting: Physician Assistant

## 2015-07-20 ENCOUNTER — Other Ambulatory Visit: Payer: Self-pay | Admitting: Physician Assistant

## 2015-07-24 ENCOUNTER — Encounter: Payer: Self-pay | Admitting: Physician Assistant

## 2015-08-16 ENCOUNTER — Ambulatory Visit (INDEPENDENT_AMBULATORY_CARE_PROVIDER_SITE_OTHER): Payer: BLUE CROSS/BLUE SHIELD | Admitting: Physician Assistant

## 2015-08-16 ENCOUNTER — Encounter: Payer: Self-pay | Admitting: Physician Assistant

## 2015-08-16 VITALS — BP 100/60 | HR 66 | Temp 97.7°F | Resp 16 | Ht 64.0 in | Wt 197.0 lb

## 2015-08-16 DIAGNOSIS — G4752 REM sleep behavior disorder: Secondary | ICD-10-CM

## 2015-08-16 DIAGNOSIS — F411 Generalized anxiety disorder: Secondary | ICD-10-CM | POA: Diagnosis not present

## 2015-08-16 DIAGNOSIS — R51 Headache: Secondary | ICD-10-CM | POA: Diagnosis not present

## 2015-08-16 DIAGNOSIS — G478 Other sleep disorders: Secondary | ICD-10-CM

## 2015-08-16 DIAGNOSIS — R519 Headache, unspecified: Secondary | ICD-10-CM

## 2015-08-16 MED ORDER — AMITRIPTYLINE HCL 25 MG PO TABS: 25.0000 mg | ORAL_TABLET | Freq: Every day | ORAL | Status: DC

## 2015-08-16 MED ORDER — DULOXETINE HCL 30 MG PO CPEP: 30.0000 mg | ORAL_CAPSULE | Freq: Every day | ORAL | Status: DC

## 2015-08-16 MED ORDER — AZITHROMYCIN 250 MG PO TABS: ORAL_TABLET | ORAL | Status: AC

## 2015-08-16 NOTE — Patient Instructions (Signed)
Stop the lexapro and start cymbalta 30mg  for anxiety/pain Can try amitriptyline 25 mg 1/2-1 at night

## 2015-08-16 NOTE — Progress Notes (Signed)
Subjective:    Patient ID: Eileen Rios, female    DOB: 19-Jun-1986, 29 y.o.   MRN: [id number]  HPI 29 y.o. WF presents with headache/neck pain. Has increased her lexapro which she thinks is helping but she has been having headache x Saturday, took 2 xanax and slept, felt better Sunday but then Monday had to leave work due to headache. Slept most of Monday and Tuesday. She has sinus pressure, jaw pain, neck pain. Has sinus congestion with chills but no fever. She is on allegra, flonase occ.   There were no vitals taken for this visit.  Past Medical History  Diagnosis Date  . Hypertension   . Headache(784.0)     migraines  . IBS (irritable bowel syndrome)   . Seasonal allergies   . Insulin resistance   . Anxiety   . Depression    Current Outpatient Prescriptions on File Prior to Visit  Medication Sig Dispense Refill  . ALPRAZolam (XANAX) 1 MG tablet TAKE ONE TABLET TWICE DAILY AS NEEDED 60 tablet 0  . amphetamine-dextroamphetamine (ADDERALL) 10 MG tablet Take 1 tablet (10 mg total) by mouth 2 (two) times daily with a meal. 60 tablet 0  . atenolol (TENORMIN) 50 MG tablet TAKE ONE TABLET AT BEDTIME 90 tablet 1  . azelastine (ASTELIN) 0.1 % nasal spray Place 2 sprays into both nostrils 2 (two) times daily. Use in each nostril as directed 30 mL 2  . bifidobacterium infantis (ALIGN) capsule Take 1 capsule by mouth daily.    . Biotin 10 MG TABS Take by mouth daily.    . cetirizine (ZYRTEC) 10 MG tablet Take 10 mg by mouth daily as needed for allergies.    Marland Kitchen. escitalopram (LEXAPRO) 20 MG tablet Take 1 tablet (20 mg total) by mouth daily. 30 tablet 2  . fluticasone (FLONASE) 50 MCG/ACT nasal spray Use 1 to 2  nasal sprays to each nostril daily 16 g 99  . hyoscyamine (LEVSIN/SL) 0.125 MG SL tablet Place 1 tablet (0.125 mg total) under the tongue every 6 (six) hours as needed. For abdominal cramping 30 tablet 0  . ibuprofen (ADVIL,MOTRIN) 200 MG tablet Take 400-600 mg by mouth every 6 (six)  hours as needed for headache.    . Multiple Vitamins-Calcium (ONE-A-DAY WOMENS FORMULA PO) Take by mouth daily.    . Norethindrone Acetate-Ethinyl Estrad-FE (LOESTRIN 24 FE) 1-20 MG-MCG(24) tablet   5  . omeprazole (PRILOSEC) 40 MG capsule Take 1 capsule (40 mg total) by mouth daily. 30 capsule 1  . ondansetron (ZOFRAN) 4 MG tablet Take 1 tablet (4 mg total) by mouth daily as needed for nausea or vomiting. 30 tablet 2  . [DISCONTINUED] Drospirenone-Ethinyl Estradiol-Levomefol (SAFYRAL) 3-0.03-0.451 MG tablet Take 1 tablet by mouth daily. 1 Package 3   No current facility-administered medications on file prior to visit.    Review of Systems  Constitutional: Negative for fever, chills and diaphoresis.  HENT: Positive for congestion. Negative for ear discharge, ear pain, hearing loss, nosebleeds, sore throat and tinnitus.   Eyes: Negative.   Respiratory: Negative.  Negative for stridor.   Cardiovascular: Negative.   Gastrointestinal: Negative for nausea, vomiting, abdominal pain, diarrhea, constipation and blood in stool.  Genitourinary: Negative.   Musculoskeletal: Positive for myalgias. Negative for back pain and neck pain.  Skin: Negative.  Negative for rash.  Neurological: Positive for headaches. Negative for dizziness, tremors, seizures, syncope, facial asymmetry, speech difficulty, weakness, light-headedness and numbness.  Psychiatric/Behavioral: Positive for sleep disturbance and decreased concentration. Negative  for suicidal ideas, hallucinations, behavioral problems, confusion, self-injury, dysphoric mood and agitation. The patient is nervous/anxious. The patient is not hyperactive.        Objective:   Physical Exam  Constitutional: She is oriented to person, place, and time. She appears well-developed and well-nourished.  HENT:  Head: Normocephalic and atraumatic.  Right Ear: External ear normal.  Left Ear: External ear normal.  Nose: Right sinus exhibits maxillary sinus  tenderness. Left sinus exhibits maxillary sinus tenderness.  Mouth/Throat: Uvula is midline and mucous membranes are normal. Posterior oropharyngeal edema and posterior oropharyngeal erythema present.  + TMJ tenderness  Eyes: Conjunctivae and EOM are normal. Pupils are equal, round, and reactive to light.  Neck: Normal range of motion. Neck supple.  Cardiovascular: Normal rate, regular rhythm and normal heart sounds.   Pulmonary/Chest: Effort normal and breath sounds normal.  Abdominal: Soft. Bowel sounds are normal.  Musculoskeletal:  normal range of motion and supple, without spinous process tenderness, with paraspinal muscle tenderness both sides, normal sensation, reflexes, and pulses distal.  Lymphadenopathy:    She has no cervical adenopathy.  Neurological: She is alert and oriented to person, place, and time. She has normal reflexes. No cranial nerve deficit.  Skin: Skin is warm and dry.      Assessment & Plan:  1. Nonintractable episodic headache, unspecified headache type Add on amitriptyline at night,  1/2-1 to try to decrease xanax use Stop lexapro try cymbalta- ? FM component  2. Generalized anxiety disorder ? With FM- switch lexapro to cymbalta.   3. Abnormal REM sleep Continue adderall PRN

## 2015-08-22 ENCOUNTER — Encounter: Payer: Self-pay | Admitting: Physician Assistant

## 2015-08-27 ENCOUNTER — Encounter: Payer: Self-pay | Admitting: Physician Assistant

## 2015-08-27 MED ORDER — LEVOFLOXACIN 500 MG PO TABS: 500.0000 mg | ORAL_TABLET | Freq: Every day | ORAL | Status: DC

## 2015-09-07 ENCOUNTER — Other Ambulatory Visit: Payer: Self-pay | Admitting: Internal Medicine

## 2015-09-10 ENCOUNTER — Encounter: Payer: Self-pay | Admitting: Physician Assistant

## 2015-09-10 ENCOUNTER — Other Ambulatory Visit: Payer: Self-pay | Admitting: Physician Assistant

## 2015-09-10 MED ORDER — AMPHETAMINE-DEXTROAMPHETAMINE 10 MG PO TABS: 10.0000 mg | ORAL_TABLET | Freq: Two times a day (BID) | ORAL | Status: DC

## 2015-09-10 NOTE — Telephone Encounter (Signed)
rx called into [facility].

## 2015-09-17 ENCOUNTER — Encounter: Payer: Self-pay | Admitting: Physician Assistant

## 2015-09-19 ENCOUNTER — Ambulatory Visit (INDEPENDENT_AMBULATORY_CARE_PROVIDER_SITE_OTHER): Payer: BLUE CROSS/BLUE SHIELD

## 2015-09-19 DIAGNOSIS — Z23 Encounter for immunization: Secondary | ICD-10-CM

## 2015-10-26 ENCOUNTER — Encounter: Payer: Self-pay | Admitting: Physician Assistant

## 2015-10-29 ENCOUNTER — Encounter: Payer: Self-pay | Admitting: Physician Assistant

## 2015-10-30 ENCOUNTER — Other Ambulatory Visit: Payer: Self-pay | Admitting: Physician Assistant

## 2015-10-30 MED ORDER — ROPINIROLE HCL 0.5 MG PO TABS: 1.0000 mg | ORAL_TABLET | Freq: Every day | ORAL | Status: DC

## 2015-10-31 ENCOUNTER — Encounter: Payer: Self-pay | Admitting: Physician Assistant

## 2015-10-31 MED ORDER — GABAPENTIN 100 MG PO CAPS: ORAL_CAPSULE | ORAL | Status: DC

## 2015-11-05 ENCOUNTER — Encounter: Payer: Self-pay | Admitting: Physician Assistant

## 2015-11-05 ENCOUNTER — Ambulatory Visit (INDEPENDENT_AMBULATORY_CARE_PROVIDER_SITE_OTHER): Payer: BLUE CROSS/BLUE SHIELD | Admitting: Physician Assistant

## 2015-11-05 ENCOUNTER — Ambulatory Visit: Payer: Self-pay | Admitting: Physician Assistant

## 2015-11-05 VITALS — BP 118/64 | HR 103 | Temp 97.9°F | Resp 16 | Ht 64.0 in | Wt 202.0 lb

## 2015-11-05 DIAGNOSIS — R51 Headache: Secondary | ICD-10-CM | POA: Diagnosis not present

## 2015-11-05 DIAGNOSIS — J0100 Acute maxillary sinusitis, unspecified: Secondary | ICD-10-CM

## 2015-11-05 DIAGNOSIS — R519 Headache, unspecified: Secondary | ICD-10-CM

## 2015-11-05 DIAGNOSIS — R208 Other disturbances of skin sensation: Secondary | ICD-10-CM | POA: Diagnosis not present

## 2015-11-05 DIAGNOSIS — G478 Other sleep disorders: Secondary | ICD-10-CM

## 2015-11-05 DIAGNOSIS — R200 Anesthesia of skin: Secondary | ICD-10-CM

## 2015-11-05 DIAGNOSIS — G4752 REM sleep behavior disorder: Secondary | ICD-10-CM

## 2015-11-05 MED ORDER — PREDNISONE 20 MG PO TABS: ORAL_TABLET | ORAL | Status: DC

## 2015-11-05 MED ORDER — SULFAMETHOXAZOLE-TRIMETHOPRIM 800-160 MG PO TABS: 1.0000 | ORAL_TABLET | Freq: Two times a day (BID) | ORAL | Status: DC

## 2015-11-05 MED ORDER — AMPHETAMINE-DEXTROAMPHETAMINE 10 MG PO TABS: 10.0000 mg | ORAL_TABLET | Freq: Two times a day (BID) | ORAL | Status: DC

## 2015-11-05 NOTE — Patient Instructions (Signed)

## 2015-11-05 NOTE — Progress Notes (Signed)
Subjective:    Patient ID: Eileen Rios, female    DOB: [birth year], 30 y.o.   MRN: [id number]  HPI 30 y.o. WF with history of headache, anxiety, depression, possible FM presents with Headache/right teeth pain. She is on cymbalta and started on gabapentin, off amitriptyline and could not tolerate requip. She has chronic headaches, neck and behind her eye. Has tried several preventative and acute medications without help. ? FM/TMJ component.   Has been having bilateral hand numbness at night, worse with bending her wrist/hold steering wheel.   Blood pressure 118/64, pulse 103, temperature 97.9 F (36.6 C), temperature source Temporal, resp. rate 16, height  (1.626 m), weight 202 lb (91.627 kg), SpO2 98 %.  Current Outpatient Prescriptions on File Prior to Visit  Medication Sig Dispense Refill  . ALPRAZolam (XANAX) 1 MG tablet TAKE ONE TABLET TWICE DAILY AS NEEDED 60 tablet 1  . amphetamine-dextroamphetamine (ADDERALL) 10 MG tablet Take 1 tablet (10 mg total) by mouth 2 (two) times daily with a meal. 60 tablet 0  . atenolol (TENORMIN) 50 MG tablet TAKE ONE TABLET AT BEDTIME 90 tablet 1  . azelastine (ASTELIN) 0.1 % nasal spray Place 2 sprays into both nostrils 2 (two) times daily. Use in each nostril as directed 30 mL 2  . bifidobacterium infantis (ALIGN) capsule Take 1 capsule by mouth daily.    . Biotin 10 MG TABS Take by mouth daily.    . cetirizine (ZYRTEC) 10 MG tablet Take 10 mg by mouth daily as needed for allergies.    Marland Kitchen gabapentin (NEURONTIN) 100 MG capsule 1-3 tablets at night for sleep 60 capsule 0  . Multiple Vitamins-Calcium (ONE-A-DAY WOMENS FORMULA PO) Take by mouth daily.    . Norethindrone Acetate-Ethinyl Estrad-FE (LOESTRIN 24 FE) 1-20 MG-MCG(24) tablet   5  . omeprazole (PRILOSEC) 40 MG capsule Take 1 capsule (40 mg total) by mouth daily. 30 capsule 1  . fluticasone (FLONASE) 50 MCG/ACT nasal spray Use 1 to 2  nasal sprays to each nostril daily 16 g 99  .  [DISCONTINUED] Drospirenone-Ethinyl Estradiol-Levomefol (SAFYRAL) 3-0.03-0.451 MG tablet Take 1 tablet by mouth daily. 1 Package 3   No current facility-administered medications on file prior to visit.    Past Medical History  Diagnosis Date  . Hypertension   . Headache(784.0)     migraines  . IBS (irritable bowel syndrome)   . Seasonal allergies   . Insulin resistance   . Anxiety   . Depression     Review of Systems  Constitutional: Negative for fever, chills and diaphoresis.  HENT: Positive for congestion. Negative for ear discharge, ear pain, hearing loss, nosebleeds, sore throat and tinnitus.   Eyes: Negative.   Respiratory: Negative.  Negative for stridor.   Cardiovascular: Negative.   Gastrointestinal: Negative for nausea, vomiting, abdominal pain, diarrhea, constipation and blood in stool.  Genitourinary: Negative.   Musculoskeletal: Positive for myalgias. Negative for back pain and neck pain.  Skin: Negative.  Negative for rash.  Neurological: Positive for headaches. Negative for dizziness, tremors, seizures, syncope, facial asymmetry, speech difficulty, weakness, light-headedness and numbness.  Psychiatric/Behavioral: Positive for sleep disturbance and decreased concentration. Negative for suicidal ideas, hallucinations, behavioral problems, confusion, self-injury, dysphoric mood and agitation. The patient is nervous/anxious. The patient is not hyperactive.        Objective:   Physical Exam  Constitutional: She is oriented to person, place, and time. She appears well-developed and well-nourished.  HENT:  Head: Normocephalic and atraumatic.  Right Ear:  External ear normal.  Left Ear: External ear normal.  Nose: Right sinus exhibits maxillary sinus tenderness. Left sinus exhibits maxillary sinus tenderness.  Mouth/Throat: Uvula is midline and mucous membranes are normal. Posterior oropharyngeal edema and posterior oropharyngeal erythema present.  + TMJ tenderness   Eyes: Conjunctivae and EOM are normal. Pupils are equal, round, and reactive to light.  Neck: Normal range of motion. Neck supple.  Cardiovascular: Normal rate, regular rhythm and normal heart sounds.   Pulmonary/Chest: Effort normal and breath sounds normal.  Abdominal: Soft. Bowel sounds are normal.  Musculoskeletal:  normal range of motion and supple, without spinous process tenderness, with paraspinal muscle tenderness both sides, normal sensation, reflexes, and pulses distal.  Lymphadenopathy:    She has no cervical adenopathy.  Neurological: She is alert and oriented to person, place, and time. She has normal reflexes. No cranial nerve deficit.  Skin: Skin is warm and dry.       Assessment & Plan:  Bilateral hand numbness-  Get carpal tunnel braces, ice, rest  Migraines- has failed several medications will refer to headache clinic for further evaluation Continue gabapentin and Cymbalta and atenolol in the mean time ? Worsened at this time by sinus infection- will give prednisone and bactrim

## 2015-11-12 ENCOUNTER — Encounter: Payer: Self-pay | Admitting: Physician Assistant

## 2015-11-21 ENCOUNTER — Ambulatory Visit (INDEPENDENT_AMBULATORY_CARE_PROVIDER_SITE_OTHER): Payer: BLUE CROSS/BLUE SHIELD | Admitting: Neurology

## 2015-11-21 ENCOUNTER — Encounter: Payer: Self-pay | Admitting: Neurology

## 2015-11-21 VITALS — BP 123/79 | HR 80 | Temp 97.1°F | Ht 64.0 in | Wt 207.3 lb

## 2015-11-21 DIAGNOSIS — G43009 Migraine without aura, not intractable, without status migrainosus: Secondary | ICD-10-CM

## 2015-11-21 MED ORDER — DULOXETINE HCL 30 MG PO CPEP: 30.0000 mg | ORAL_CAPSULE | Freq: Every day | ORAL | Status: DC

## 2015-11-21 MED ORDER — METOCLOPRAMIDE HCL 10 MG PO TABS: ORAL_TABLET | ORAL | Status: DC

## 2015-11-21 NOTE — Progress Notes (Signed)
[facility]    Provider:  Dr Lucia Gaskins Referring Provider: Lucky Cowboy, MD Primary Care Physician:  Nadean Corwin, MD  CC:  Headaches  HPI:  Eileen Rios is a 30 y.o. female here as a referral from Dr. Oneta Rack for headaches. PMHx HTN, IBS, Anxiety and depression, HLD, migraines, headaches. They started in HS without inciting event. They are chronic and not increasing in frequency or severity. They are stable. Headaches are worse with the weather shift. Headaches are different all the time, she has menstrual migraines in the frontal area.  Oxycodone is the only thing that works for her if she gets a migraine. She gets some mild headaches every day. She has migraines once a month or every 3 weeks depending. She can't remember what she has tried. Imitrex made her sick. A nasal spray made her sick. With her migraines she gets light sensitivity, sound sensitivity, has to go into a dark room, she vomits. She may see "confetti" in her vision before the migraines 24 hours beforehand. Oxycodone has helped. Tries advil first. She has other headache types throughout the month, mild tension headache every day. She takes alleve every other day, discussed rebound headaches. She takes xanax as well for migraines. She has insomnia. Elavil made her feel too hung over  The next day. She only gets a few hours of sleep a night, does not appear to have good sleep hygiene.She just started Cymbalta. Venlafaxine in the past helped.  Reviewed notes, labs and imaging from outside physicians, which showed:  A review of her medication history shows that she has been on the following medications which are also used in migraine treatment: Elavil, atenolol, baclofen, fioricet, flexeril, lexapro, zofran magnesium nurontin,labetalol, prednisone, phenergan,   Sleep study showed minimal obstructive sleep apnea with no desaturations, mild snoring, some difficulty initiating sleep and prolonged mid sleep  cycle awakening . 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   Cbc, cmp,tsh, hgba1c unremarkable.   Review of Systems: Patient complains of symptoms per HPI as well as the following symptoms: confusion, headache, insomnia, increased thirst, cramps, aching muscles, constipation, depression, anxiety. Pertinent negatives per HPI. All others negative.   Social History   Social History  . Marital Status: Married    Spouse Name: N/A  . Number of Children: N/A  . Years of Education: N/A   Occupational History  . Not on file.   Social History Main Topics  . Smoking status: Never Smoker   . Smokeless tobacco: Never Used  . Alcohol Use: No  . Drug Use: No  . Sexual Activity: Yes   Other Topics Concern  . Not on file   Social History Narrative    Family History  Problem Relation Age of Onset  . Migraines Neg Hx     Past Medical History  Diagnosis Date  . Hypertension   . Headache(784.0)     migraines  . IBS (irritable bowel syndrome)   . Seasonal allergies   . Insulin resistance   . Anxiety   . Depression     Past Surgical History  Procedure Laterality Date  . Wisdom tooth extraction    . Cesarean section N/A 06/05/2013    Procedure: Primary cesarean section with delivery of baby boy at 1033.  Apgars 1/8.;  Surgeon: Genia Del, MD;  Location: [facility];  Service: Obstetrics;  Laterality: N/A;    Current Outpatient Prescriptions  Medication Sig Dispense Refill  . ALPRAZolam (XANAX) 1 MG tablet  TAKE ONE TABLET TWICE DAILY AS NEEDED 60 tablet 1  . amphetamine-dextroamphetamine (ADDERALL) 10 MG tablet Take 1 tablet (10 mg total) by mouth 2 (two) times daily with a meal. 60 tablet 0  . atenolol (TENORMIN) 50 MG tablet TAKE ONE TABLET AT BEDTIME 90 tablet 1  . azelastine (ASTELIN) 0.1 % nasal spray Place 2 sprays into both nostrils 2 (two) times daily. Use in each nostril as directed 30 mL 2  . bifidobacterium infantis  (ALIGN) capsule Take 1 capsule by mouth daily.    . Biotin 10 MG TABS Take by mouth daily.    . cetirizine (ZYRTEC) 10 MG tablet Take 10 mg by mouth daily as needed for allergies.    . Multiple Vitamins-Calcium (ONE-A-DAY WOMENS FORMULA PO) Take by mouth daily.    . Norethindrone Acetate-Ethinyl Estrad-FE (LOESTRIN 24 FE) 1-20 MG-MCG(24) tablet   5  . omeprazole (PRILOSEC) 40 MG capsule Take 1 capsule (40 mg total) by mouth daily. 30 capsule 1  . DULoxetine (CYMBALTA) 30 MG capsule Take 1 capsule (30 mg total) by mouth daily. 30 capsule 12  . metoCLOPramide (REGLAN) 10 MG tablet Take at onset of migraine. Can take it every 6 hours if needed. 10 tablet 12  . [DISCONTINUED] Drospirenone-Ethinyl Estradiol-Levomefol (SAFYRAL) 3-0.03-0.451 MG tablet Take 1 tablet by mouth daily. 1 Package 3   No current facility-administered medications for this visit.    Allergies as of 11/21/2015  . (No Known Allergies)    Vitals: BP 123/79 mmHg  Pulse 80  Temp(Src) 97.1 F (36.2 C) (Oral)  Ht  (1.626 m)  Wt 207 lb 4.8 oz (94.031 kg)  BMI 35.57 kg/m2  LMP  Last Weight:  Wt Readings from Last 1 Encounters:  11/21/15 207 lb 4.8 oz (94.031 kg)   Last Height:   Ht Readings from Last 1 Encounters:  11/21/15  (1.626 m)   Physical exam: Exam: Gen: NAD, conversant, well nourised, obese, well groomed                     CV: RRR, no MRG. No Carotid Bruits. No peripheral edema, warm, nontender Eyes: Conjunctivae clear without exudates or hemorrhage  Neuro: Detailed Neurologic Exam  Speech:    Speech is normal; fluent and spontaneous with normal comprehension.  Cognition:    The patient is oriented to person, place, and time;     recent and remote memory intact;     language fluent;     normal attention, concentration,     fund of knowledge Cranial Nerves:    The pupils are equal, round, and reactive to light. The fundi are normal and spontaneous venous pulsations are present. Visual  fields are full to finger confrontation. Extraocular movements are intact. Trigeminal sensation is intact and the muscles of mastication are normal. The face is symmetric. The palate elevates in the midline. Hearing intact. Voice is normal. Shoulder shrug is normal. The tongue has normal motion without fasciculations.   Coordination:    Normal finger to nose and heel to shin. Normal rapid alternating movements.   Gait:    Heel-toe and tandem gait are normal.   Motor Observation:    No asymmetry, no atrophy, and no involuntary movements noted. Tone:    Normal muscle tone.    Posture:    Posture is normal. normal erect    Strength:    Strength is V/V in the upper and lower limbs.      Sensation: intact to  LT     Reflex Exam:  DTR's:    Deep tendon reflexes in the upper and lower extremities are normal bilaterally.   Toes:    The toes are downgoing bilaterally.   Clonus:    Clonus is absent.       Assessment/Plan:  30 year old with migraines, headaches, insomnia, depression, anxiety. Neuro exam normal.  Migraines and Headaches: Increase Cymbalta to 30mg  a day. Can then increase up to 60mg  bid as tolerated and needed.  At onset of headache take: Reglan  Provided patient with UpToDate patient medication information print outs.  Reglan: Discussed extrapyramidal sxs, dystonia, tardive dyskinesia which can be permanent, parkinsonism, seizures, drowsiness, restlessness, fatigue, dizziness. This is not a complete list, advised her to read handouts.  Insomnia: Good sleep hygiene, weight loss, recommended therapy  Do not take ibuprofen more than 10x a month, can lead to medication overuse headache.  To prevent or relieve headaches, try the following: Cool Compress. Lie down and place a cool compress on your head.  Avoid headache triggers. If certain foods or odors seem to have triggered your migraines in the past, avoid them. A headache diary might help you identify triggers.    Include physical activity in your daily routine. Try a daily walk or other moderate aerobic exercise.  Manage stress. Find healthy ways to cope with the stressors, such as delegating tasks on your to-do list.  Practice relaxation techniques. Try deep breathing, yoga, massage and visualization.  Eat regularly. Eating regularly scheduled meals and maintaining a healthy diet might help prevent headaches. Also, drink plenty of fluids.  Follow a regular sleep schedule. Sleep deprivation might contribute to headaches Consider biofeedback. With this mind-body technique, you learn to control certain bodily functions - such as muscle tension, heart rate and blood pressure - to prevent headaches or reduce headache pain.    Proceed to emergency room if you experience new or worsening symptoms or symptoms do not resolve, if you have new neurologic symptoms or if headache is severe, or for any concerning symptom.    Naomie Dean, MD  [facility] [address] [place], [place] [address]  Phone [phone] Fax [phone]

## 2015-11-21 NOTE — Patient Instructions (Addendum)
Remember to drink plenty of fluid, eat healthy meals and do not skip any meals. Try to eat protein with a every meal and eat a healthy snack such as fruit or nuts in between meals. Try to keep a regular sleep-wake schedule and try to exercise daily, particularly in the form of walking, 20-30 minutes a day, if you can.   As far as your medications are concerned, I would like to suggest:  Increase Cymbalta to  a day At onset of headache take Reglan  Our phone number is [phone]. We also have an after hours call service for urgent matters and there is a physician on-call for urgent questions. For any emergencies you know to call 911 or go to the nearest emergency room

## 2015-11-29 ENCOUNTER — Other Ambulatory Visit: Payer: Self-pay | Admitting: Physician Assistant

## 2015-11-29 NOTE — Telephone Encounter (Signed)
RX CALLED INTO [facility]. 

## 2015-12-06 IMAGING — DX DG CHEST 2V
2 series · 2 of 2 positions shown · non-contrast
Comparison: None.

CLINICAL DATA: Cough and chest pain.

EXAM:
CHEST  2 VIEW

[chest pa]
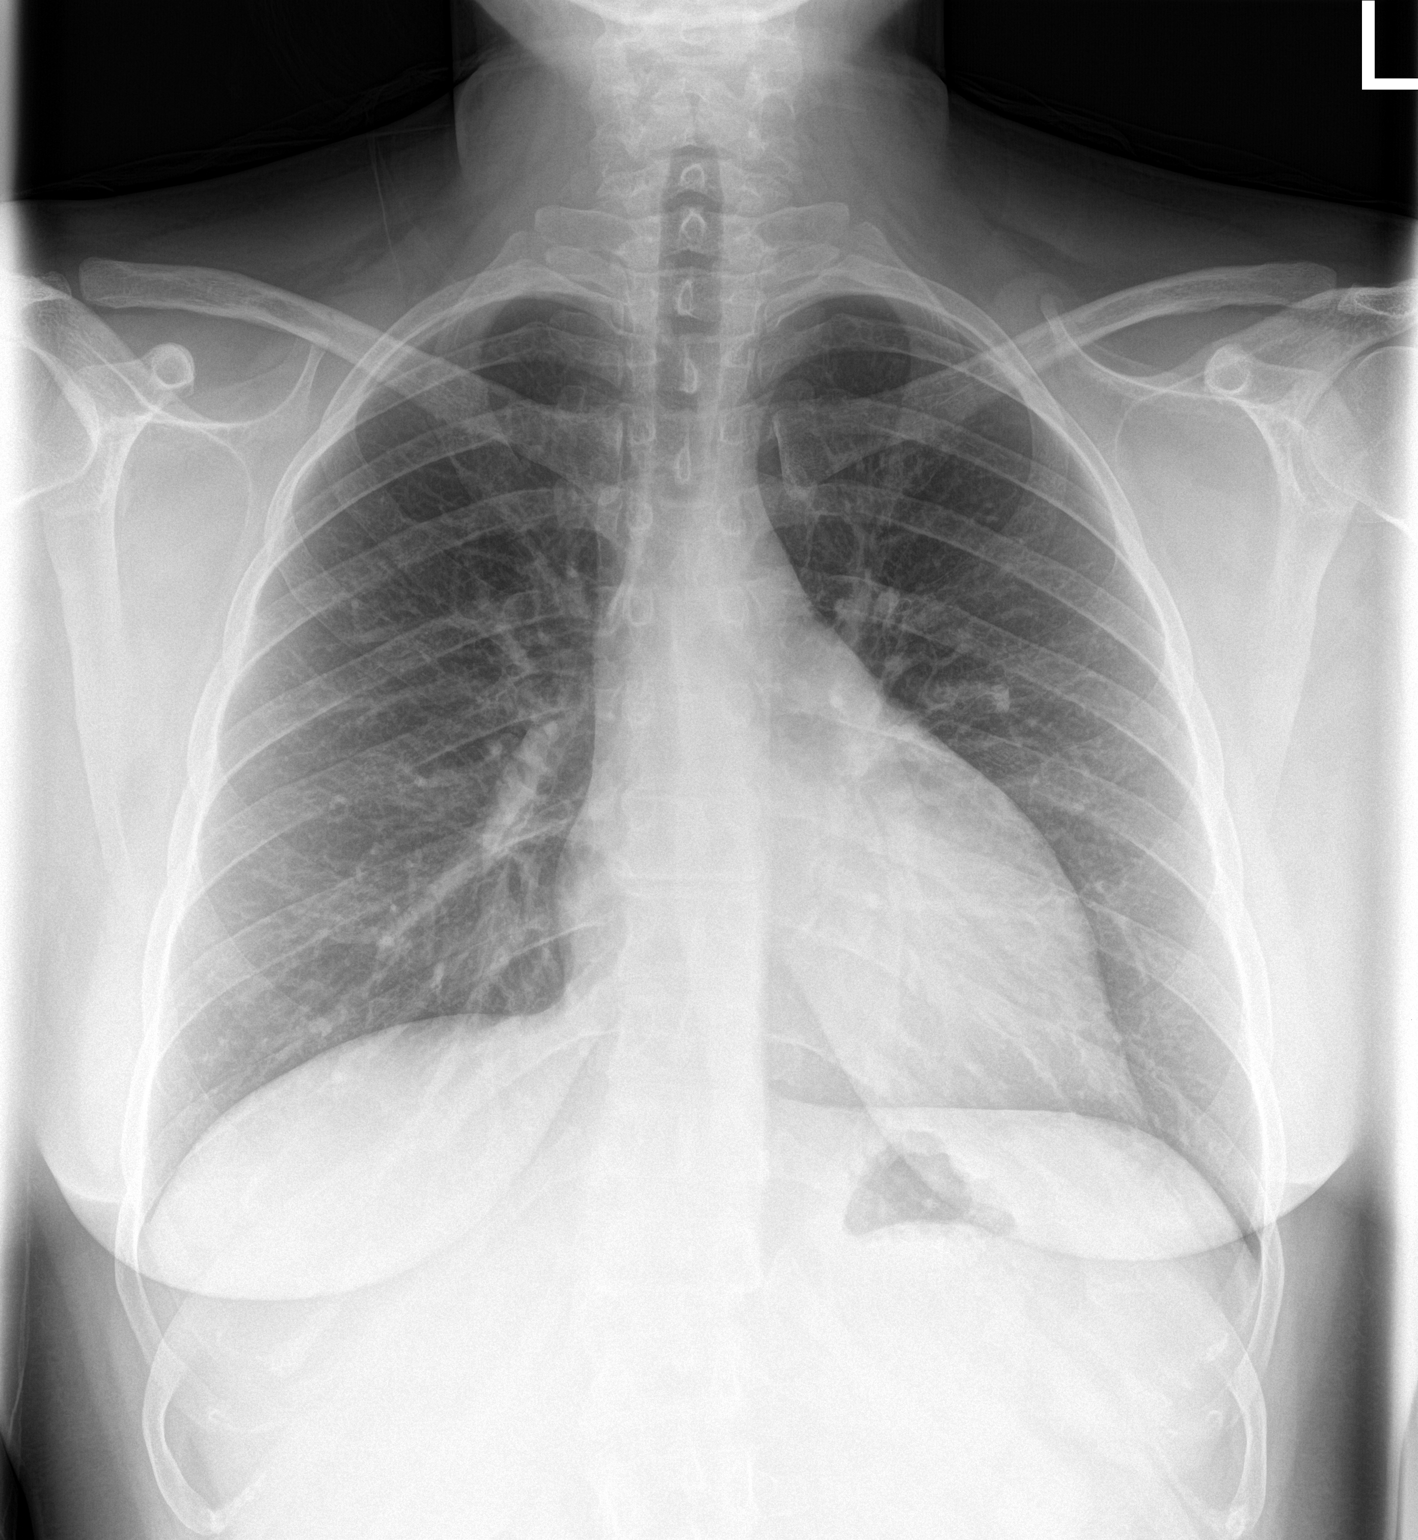

[chest lat]
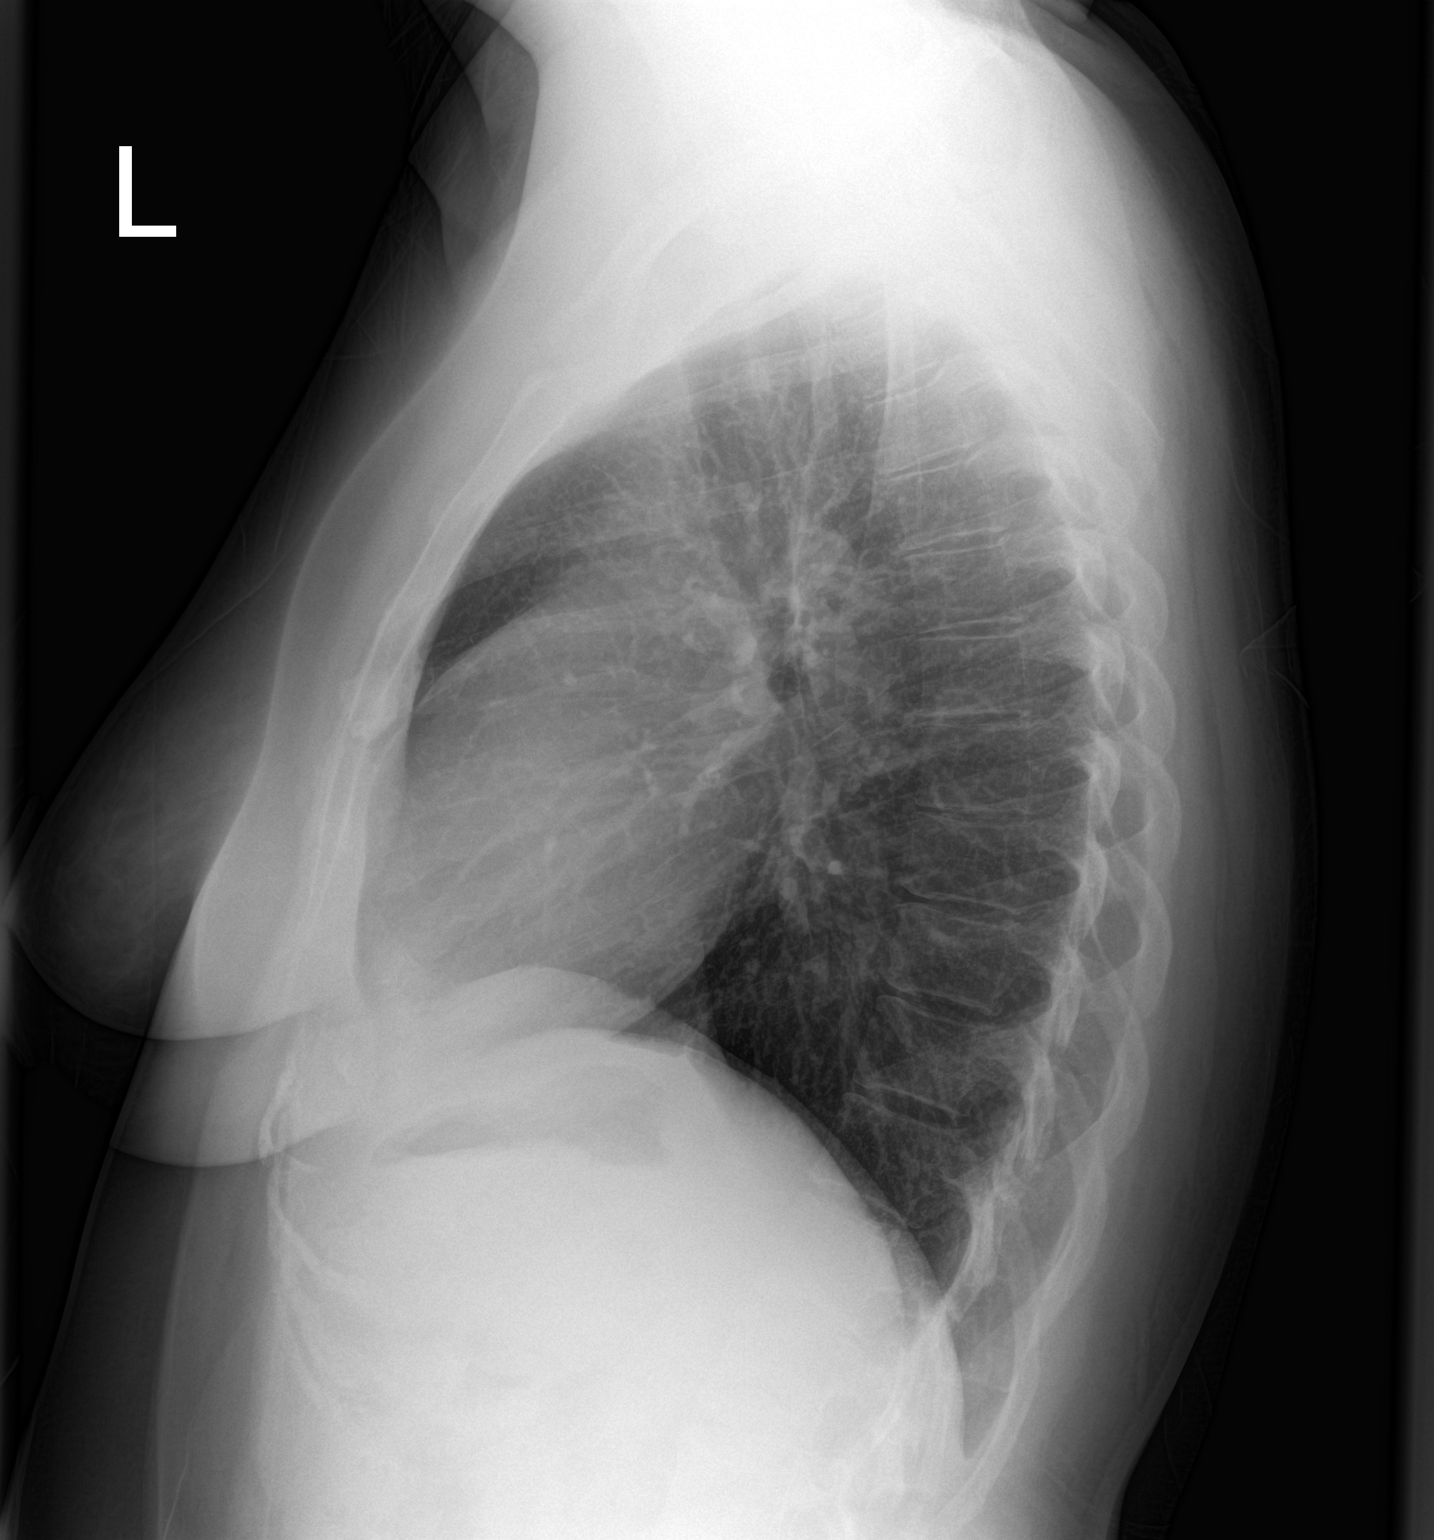

[2 of 2 positions shown; findings below may reference images not displayed]

FINDINGS: The heart size and mediastinal contours are within normal limits.
Both lungs are clear. The visualized skeletal structures are
unremarkable.
IMPRESSION: Negative two view chest.

## 2015-12-12 ENCOUNTER — Other Ambulatory Visit: Payer: Self-pay | Admitting: Physician Assistant

## 2015-12-14 ENCOUNTER — Other Ambulatory Visit: Payer: Self-pay | Admitting: Internal Medicine

## 2015-12-24 ENCOUNTER — Encounter: Payer: Self-pay | Admitting: Physician Assistant

## 2015-12-24 ENCOUNTER — Other Ambulatory Visit: Payer: Self-pay | Admitting: Physician Assistant

## 2015-12-24 MED ORDER — PREDNISONE 20 MG PO TABS: ORAL_TABLET | ORAL | Status: DC

## 2015-12-31 ENCOUNTER — Encounter: Payer: Self-pay | Admitting: Physician Assistant

## 2016-01-01 ENCOUNTER — Encounter: Payer: Self-pay | Admitting: Internal Medicine

## 2016-01-01 ENCOUNTER — Ambulatory Visit (INDEPENDENT_AMBULATORY_CARE_PROVIDER_SITE_OTHER): Payer: BLUE CROSS/BLUE SHIELD | Admitting: Internal Medicine

## 2016-01-01 VITALS — BP 128/90 | HR 92 | Temp 98.2°F | Resp 18 | Ht 64.0 in

## 2016-01-01 DIAGNOSIS — J329 Chronic sinusitis, unspecified: Secondary | ICD-10-CM

## 2016-01-01 DIAGNOSIS — J309 Allergic rhinitis, unspecified: Secondary | ICD-10-CM | POA: Diagnosis not present

## 2016-01-01 MED ORDER — KETOROLAC TROMETHAMINE 30 MG/ML IJ SOLN
30.0000 mg | Freq: Once | INTRAMUSCULAR | Status: AC
  Administered 2016-01-02: 30 mg via INTRAMUSCULAR

## 2016-01-01 MED ORDER — AMPHETAMINE-DEXTROAMPHETAMINE 10 MG PO TABS: 10.0000 mg | ORAL_TABLET | Freq: Two times a day (BID) | ORAL | Status: DC

## 2016-01-01 MED ORDER — CHLORPHEN-PE-ACETAMINOPHEN 4-10-325 MG PO TABS: 1.0000 | ORAL_TABLET | Freq: Three times a day (TID) | ORAL | Status: DC | PRN

## 2016-01-01 MED ORDER — RANITIDINE HCL 300 MG PO TABS: 300.0000 mg | ORAL_TABLET | Freq: Every day | ORAL | Status: DC

## 2016-01-01 MED ORDER — PREDNISONE 20 MG PO TABS: ORAL_TABLET | ORAL | Status: DC

## 2016-01-01 NOTE — Progress Notes (Signed)
   Subjective:    Patient ID: Eileen Rios, female    DOB: 06-14-1986, 30 y.o.   MRN: [id number]  Headache  Associated symptoms include sinus pressure. Pertinent negatives include no ear pain, fever or rhinorrhea.  Patient presents to the office for evaluation of left sided headache x 4 days.  She has been taking zyrtec in the morning and allegra at night and has also been taking prednisone.  She did start a prednisone pack and has been trying steam.  She also reports that she tried her migraine medication and that wasn't helping much either.  She reports that she is getting some blood from her nose, but can't get a lot of congestion out.  She doesn't like taking the sudafed because it makes her jittery.      Review of Systems  Constitutional: Negative for fever and chills.  HENT: Positive for congestion, postnasal drip and sinus pressure. Negative for ear pain and rhinorrhea.   Respiratory: Negative for chest tightness and shortness of breath.   Neurological: Positive for headaches.       Objective:   Physical Exam  Constitutional: She is oriented to person, place, and time. She appears well-developed and well-nourished. No distress.  HENT:  Head: Normocephalic.  Right Ear: A middle ear effusion is present.  Left Ear: A middle ear effusion is present.  Nose: Mucosal edema present. Right sinus exhibits maxillary sinus tenderness and frontal sinus tenderness. Left sinus exhibits maxillary sinus tenderness and frontal sinus tenderness.  Mouth/Throat: Uvula is midline, oropharynx is clear and moist and mucous membranes are normal. No trismus in the jaw. No oropharyngeal exudate.  Eyes: Conjunctivae are normal. No scleral icterus.  Neck: Normal range of motion. Neck supple. No JVD present. No thyromegaly present.  Cardiovascular: Normal rate, regular rhythm, normal heart sounds and intact distal pulses.  Exam reveals no gallop and no friction rub.   No murmur heard. Pulmonary/Chest:  Effort normal and breath sounds normal. No respiratory distress. She has no wheezes. She has no rales. She exhibits no tenderness.  Abdominal: Soft. Bowel sounds are normal. She exhibits no distension and no mass. There is no tenderness. There is no rebound and no guarding.  Musculoskeletal: Normal range of motion.  Lymphadenopathy:    She has no cervical adenopathy.  Neurological: She is alert and oriented to person, place, and time. No cranial nerve deficit. Coordination normal.  Skin: Skin is warm and dry. She is not diaphoretic.  Psychiatric: She has a normal mood and affect. Her behavior is normal. Judgment and thought content normal.  Nursing note and vitals reviewed.   Filed Vitals:   01/01/16 1632  BP: 128/90  Pulse: 92  Temp: 98.2 F (36.8 C)  Resp: 18         Assessment & Plan:    1. Allergic rhinitis, unspecified allergic rhinitis type -likely source of infection  2. Chronic sinusitis, unspecified location -toradol -ranitidine -norel ad -prednisone taper

## 2016-01-02 DIAGNOSIS — J329 Chronic sinusitis, unspecified: Secondary | ICD-10-CM | POA: Diagnosis not present

## 2016-01-07 ENCOUNTER — Encounter: Payer: Self-pay | Admitting: Internal Medicine

## 2016-01-07 ENCOUNTER — Other Ambulatory Visit: Payer: Self-pay | Admitting: Internal Medicine

## 2016-01-11 ENCOUNTER — Encounter: Payer: Self-pay | Admitting: Physician Assistant

## 2016-01-11 ENCOUNTER — Ambulatory Visit (INDEPENDENT_AMBULATORY_CARE_PROVIDER_SITE_OTHER): Payer: BLUE CROSS/BLUE SHIELD | Admitting: Physician Assistant

## 2016-01-11 VITALS — BP 118/72 | HR 92 | Temp 97.7°F | Resp 16 | Ht 64.0 in | Wt 209.0 lb

## 2016-01-11 DIAGNOSIS — J329 Chronic sinusitis, unspecified: Secondary | ICD-10-CM | POA: Diagnosis not present

## 2016-01-11 MED ORDER — LEVOFLOXACIN 500 MG PO TABS: 500.0000 mg | ORAL_TABLET | Freq: Every day | ORAL | Status: DC

## 2016-01-11 NOTE — Patient Instructions (Signed)

## 2016-01-11 NOTE — Progress Notes (Signed)
Subjective:    Patient ID: Eileen Rios, female    DOB: 28-Jul-1986, 30 y.o.   MRN: [id number]  HPI 30 y.o. WF with history of recurrent sinus infection and headaches presents with cold symptoms. Treated 03/28 with prednisone, toradol, and OTC medications. Prednisone long taper has helped, on mucinex/cough syrup/local honey that is helping but not getting better, productive cough with brown/green sputum, worse at night, rhinorrhea, sinus pressure, teeth pain. Going to the beach today with her husband only and wants to feel better.   Blood pressure 118/72, pulse 92, temperature 97.7 F (36.5 C), temperature source Temporal, resp. rate 16, height 5\' 4"  (1.626 m), weight 209 lb (94.802 kg), SpO2 97 %.  Past Medical History  Diagnosis Date  . Hypertension   . Headache(784.0)     migraines  . IBS (irritable bowel syndrome)   . Seasonal allergies   . Insulin resistance   . Anxiety   . Depression    Current Outpatient Prescriptions on File Prior to Visit  Medication Sig Dispense Refill  . ALPRAZolam (XANAX) 1 MG tablet TAKE ONE TABLET TWICE DAILY AS NEEDED 60 tablet 2  . amphetamine-dextroamphetamine (ADDERALL) 10 MG tablet Take 1 tablet (10 mg total) by mouth 2 (two) times daily with a meal. 60 tablet 0  . atenolol (TENORMIN) 50 MG tablet TAKE ONE TABLET AT BEDTIME 90 tablet 1  . azelastine (ASTELIN) 0.1 % nasal spray PLACE 2 SPRAYS INTO BOTH NOSTRILS 2 TIMES DAILY- USE IN EACH NOSTRIL AS DIRECTED 30 mL 3  . bifidobacterium infantis (ALIGN) capsule Take 1 capsule by mouth daily.    . Biotin 10 MG TABS Take by mouth daily.    . cetirizine (ZYRTEC) 10 MG tablet Take 10 mg by mouth daily as needed for allergies.    . Chlorphen-PE-Acetaminophen 4-10-325 MG TABS Take 1 tablet by mouth 3 (three) times daily as needed. 30 tablet 0  . DULoxetine (CYMBALTA) 30 MG capsule Take 1 capsule (30 mg total) by mouth daily. 30 capsule 12  . metoCLOPramide (REGLAN) 10 MG tablet Take at onset of migraine.  Can take it every 6 hours if needed. 10 tablet 12  . Multiple Vitamins-Calcium (ONE-A-DAY WOMENS FORMULA PO) Take by mouth daily.    . Norethindrone Acetate-Ethinyl Estrad-FE (LOESTRIN 24 FE) 1-20 MG-MCG(24) tablet   5  . omeprazole (PRILOSEC) 40 MG capsule TAKE ONE CAPSULE EACH DAY 30 capsule 2  . predniSONE (DELTASONE) 20 MG tablet 3 tabs po daily x 3 days, then 2 tabs x 3 days, then 1.5 tabs x 3 days, then 1 tab x 3 days, then 0.5 tabs x 3 days 27 tablet 0  . ranitidine (ZANTAC) 300 MG tablet Take 1 tablet (300 mg total) by mouth at bedtime. 30 tablet 1  . [DISCONTINUED] Drospirenone-Ethinyl Estradiol-Levomefol (SAFYRAL) 3-0.03-0.451 MG tablet Take 1 tablet by mouth daily. 1 Package 3   No current facility-administered medications on file prior to visit.     Review of Systems  Constitutional: Negative for chills and diaphoresis.  HENT: Positive for congestion, postnasal drip, sinus pressure and sneezing. Negative for ear pain and sore throat.   Respiratory: Positive for cough. Negative for chest tightness, shortness of breath and wheezing.   Cardiovascular: Negative.   Gastrointestinal: Negative.   Genitourinary: Negative.   Musculoskeletal: Negative for neck pain.  Neurological: Positive for headaches.       Objective:   Physical Exam  Constitutional: She appears well-developed and well-nourished.  HENT:  Head: Normocephalic and atraumatic.  Right Ear: External ear normal.  Nose: Right sinus exhibits maxillary sinus tenderness. Right sinus exhibits no frontal sinus tenderness. Left sinus exhibits maxillary sinus tenderness. Left sinus exhibits no frontal sinus tenderness.  Eyes: Conjunctivae and EOM are normal.  Neck: Normal range of motion. Neck supple.  Cardiovascular: Normal rate, regular rhythm, normal heart sounds and intact distal pulses.   Pulmonary/Chest: Effort normal and breath sounds normal. No respiratory distress. She has no wheezes.  Abdominal: Soft. Bowel sounds  are normal.  Lymphadenopathy:    She has cervical adenopathy.  Skin: Skin is warm and dry.       Assessment & Plan:  1. Chronic sinusitis, unspecified location Continue other medications, has been 10 days, not better, with teeth pain and sinus tenderness - levofloxacin (LEVAQUIN) 500 MG tablet; Take 1 tablet (500 mg total) by mouth daily.  Dispense: 10 tablet; Refill: 0

## 2016-01-25 ENCOUNTER — Ambulatory Visit (INDEPENDENT_AMBULATORY_CARE_PROVIDER_SITE_OTHER): Payer: BLUE CROSS/BLUE SHIELD

## 2016-01-25 ENCOUNTER — Encounter: Payer: Self-pay | Admitting: Physician Assistant

## 2016-01-25 DIAGNOSIS — J329 Chronic sinusitis, unspecified: Secondary | ICD-10-CM | POA: Diagnosis not present

## 2016-01-25 DIAGNOSIS — R519 Headache, unspecified: Secondary | ICD-10-CM

## 2016-01-25 DIAGNOSIS — R51 Headache: Secondary | ICD-10-CM

## 2016-01-25 MED ORDER — KETOROLAC TROMETHAMINE 30 MG/ML IJ SOLN
60.0000 mg | Freq: Once | INTRAMUSCULAR | Status: AC
  Administered 2016-01-25: 60 mg via INTRAMUSCULAR

## 2016-01-25 NOTE — Progress Notes (Signed)
Patient with history of chronic sinusitis/headaches, following with neurology presents with 3 days sinus headache, will give Toradol shot that helped last visit, continue medications the same.

## 2016-01-30 ENCOUNTER — Encounter: Payer: Self-pay | Admitting: Physician Assistant

## 2016-01-31 ENCOUNTER — Encounter: Payer: Self-pay | Admitting: Internal Medicine

## 2016-02-01 ENCOUNTER — Other Ambulatory Visit: Payer: Self-pay | Admitting: Physician Assistant

## 2016-02-05 ENCOUNTER — Encounter: Payer: Self-pay | Admitting: Internal Medicine

## 2016-02-05 ENCOUNTER — Ambulatory Visit (INDEPENDENT_AMBULATORY_CARE_PROVIDER_SITE_OTHER): Payer: BLUE CROSS/BLUE SHIELD | Admitting: Internal Medicine

## 2016-02-05 VITALS — BP 118/62 | HR 88 | Temp 98.0°F | Resp 18 | Ht 64.0 in | Wt 211.0 lb

## 2016-02-05 DIAGNOSIS — K648 Other hemorrhoids: Secondary | ICD-10-CM

## 2016-02-05 DIAGNOSIS — J309 Allergic rhinitis, unspecified: Secondary | ICD-10-CM

## 2016-02-05 DIAGNOSIS — K644 Residual hemorrhoidal skin tags: Secondary | ICD-10-CM

## 2016-02-05 NOTE — Patient Instructions (Signed)
You can take tylenol 1,000 mg three times per day.  Please avoid taking ibuprofen, advil, aleve, naproxen because these will put you at risk for ulcers in your stomach.  Please use the norel, but be careful because that does have tylenol in it to.  No more than 4,000 mg of tylenol per day.

## 2016-02-05 NOTE — Progress Notes (Signed)
Subjective:    Patient ID: Eileen Rios, female    DOB: 02/23/1986, 30 y.o.   MRN: [id number]  HPI  Patient presents to the office for evaluation of some blood in her stools.  She reports that she had about 3 days of bloody stools with some mild clots.  She reports that she started using hemorrhoid cream and the blood in her stools has stopped.  He last blood stool was on 02/02/16.  She reports that the blood was bright red in the toilet bowel.  She reports that she didn't have a lot of straining.  She reports that she was eating a lot more fiber.  She has been on her feet more, but haven't doing any heaving lifting.  Mild rectal itching, but not nearly as uncomfortable as it has been in the past.  She was able to feel them when she used the anusol.  She notes that she did have some queasiness which has mostly resolved.  She stopped taking ibuprofen, aspirin and aleve.  She reports that she hasn't taken those in a while.  She reports that she has not had reflux.  She has been taking omeprazole and ranitidine at night.  She has seen a GI doctor in the past.    She reports that her throat got sore today and she has some mild ear pain.  She reports that she has bad allergies. She has not been taking norel right now.  She reports that she is not having headaches currently.     Review of Systems  Constitutional: Negative for fever, chills and fatigue.  HENT: Positive for congestion, ear pain and sore throat. Negative for postnasal drip.   Respiratory: Negative for cough, chest tightness and shortness of breath.   Cardiovascular: Negative for chest pain and palpitations.  Gastrointestinal: Positive for nausea, blood in stool and anal bleeding. Negative for vomiting, abdominal pain, diarrhea, constipation and abdominal distention.  Genitourinary: Negative for dysuria, urgency, frequency, hematuria and difficulty urinating.       Objective:   Physical Exam  Constitutional: She is oriented to  person, place, and time. She appears well-developed and well-nourished. No distress.  HENT:  Head: Normocephalic.  Mouth/Throat: Oropharynx is clear and moist. No oropharyngeal exudate.  Eyes: Conjunctivae are normal. No scleral icterus.  Neck: Normal range of motion. Neck supple. No JVD present. No thyromegaly present.  Cardiovascular: Normal rate, regular rhythm, normal heart sounds and intact distal pulses.  Exam reveals no gallop and no friction rub.   No murmur heard. Pulmonary/Chest: Effort normal and breath sounds normal. No respiratory distress. She has no wheezes. She has no rales. She exhibits no tenderness.  Abdominal: Soft. Bowel sounds are normal. She exhibits no distension and no mass. There is no tenderness. There is no rebound and no guarding.  Genitourinary: Rectal exam shows external hemorrhoid. Rectal exam shows no internal hemorrhoid, no fissure, no mass, no tenderness and anal tone normal.  Multiple sentinel skin tags with a single soft non-thrombosed external hemorrhoid.  Internal rectal exam declined by patient.  Musculoskeletal: Normal range of motion.  Lymphadenopathy:    She has no cervical adenopathy.  Neurological: She is alert and oriented to person, place, and time. No cranial nerve deficit. Coordination normal.  Skin: Skin is warm and dry. She is not diaphoretic.  Psychiatric: She has a normal mood and affect. Her behavior is normal. Judgment and thought content normal.  Nursing note and vitals reviewed.   Filed Vitals:   02/05/16  0944  BP: 118/62  Pulse: 88  Temp: 98 F (36.7 C)  Resp: 18        Assessment & Plan:    1. External hemorrhoids -appears to be improved with proctzone cream -avoid strainging -high fiber diet -refer to GI for possible banding -avoid NSAIDs -doubt active GI bleed - Ambulatory referral to Gastroenterology  2. Allergic rhinitis, unspecified allergic rhinitis type -norel ad

## 2016-02-14 ENCOUNTER — Other Ambulatory Visit: Payer: Self-pay | Admitting: Internal Medicine

## 2016-02-14 ENCOUNTER — Encounter: Payer: Self-pay | Admitting: Physician Assistant

## 2016-02-14 DIAGNOSIS — J309 Allergic rhinitis, unspecified: Secondary | ICD-10-CM

## 2016-02-15 ENCOUNTER — Encounter: Payer: Self-pay | Admitting: Internal Medicine

## 2016-02-18 ENCOUNTER — Other Ambulatory Visit: Payer: Self-pay | Admitting: Internal Medicine

## 2016-02-18 MED ORDER — PREDNISONE 20 MG PO TABS: ORAL_TABLET | ORAL | Status: DC

## 2016-02-19 ENCOUNTER — Encounter: Payer: Self-pay | Admitting: Adult Health

## 2016-02-19 ENCOUNTER — Ambulatory Visit (INDEPENDENT_AMBULATORY_CARE_PROVIDER_SITE_OTHER): Payer: BLUE CROSS/BLUE SHIELD | Admitting: Adult Health

## 2016-02-19 VITALS — BP 108/73 | HR 81 | Ht 64.0 in | Wt 213.4 lb

## 2016-02-19 DIAGNOSIS — R519 Headache, unspecified: Secondary | ICD-10-CM

## 2016-02-19 DIAGNOSIS — R51 Headache: Secondary | ICD-10-CM | POA: Diagnosis not present

## 2016-02-19 MED ORDER — DULOXETINE HCL 20 MG PO CPEP: 40.0000 mg | ORAL_CAPSULE | Freq: Every day | ORAL | Status: DC

## 2016-02-19 NOTE — Patient Instructions (Signed)
Increase Cymbalta to 40 mg daily If your symptoms worsen or you develop new symptoms please let us know.   

## 2016-02-19 NOTE — Progress Notes (Addendum)
PATIENT: Eileen Rios DOB: 08-13-1986  REASON FOR VISIT: follow up- headaches HISTORY FROM: patient  HISTORY OF PRESENT ILLNESS: Eileen Rios is a 30 year old female with a history of headaches. She returns today for follow-up. She states that she continues to have daily headaches. At the last visit her Cymbalta was increased however she states that she was already on 30 mg. She states that she does have severe allergies and is on several medications. She feels that some of her headaches may be related to her allergies. She also states that she is under a lot of stress and has a history of obsessive-compulsive disorder. She also reports that she has a sleep disorder. 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 She has tried sleep agents but could not tolerate them. She is on Adderall to help with daytime sleepiness. She states that she only gets 1 true migraine a month. Most of her headaches are located in the temporal region. With severe headaches she does have photophobia and phonophobia and some nausea. She states that only oxycodone will help her severe headaches. She states that she will be seeing a therapist to help her deal with her stress. She also reports that she will be seen in ophthalmologist to be evaluated for glaucoma in the right eye. She returns today for an evaluation.   HISTORY 11/21/15 (MM): Eileen Rios is a 30 y.o. female here as a referral from Eileen Rios for headaches. PMHx HTN, IBS, Anxiety and depression, HLD, migraines, headaches. They started in HS without inciting event. They are chronic and not increasing in frequency or severity. They are stable. Headaches are worse with the weather shift. Headaches are different all the time, she has menstrual migraines in the frontal area. Oxycodone is the only thing that works for her if she gets a migraine. She gets some mild headaches every day. She has migraines once a month or every 3 weeks  depending. She can't remember what she has tried. Imitrex made her sick. A nasal spray made her sick. With her migraines she gets light sensitivity, sound sensitivity, has to go into a dark room, she vomits. She may see "confetti" in her vision before the migraines 24 hours beforehand. Oxycodone has helped. Tries advil first. She has other headache types throughout the month, mild tension headache every day. She takes alleve every other day, discussed rebound headaches. She takes xanax as well for migraines. She has insomnia. Elavil made her feel too hung over The next day. She only gets a few hours of sleep a night, does not appear to have good sleep hygiene.She just started Cymbalta. Venlafaxine in the past helped.  Reviewed notes, labs and imaging from outside physicians, which showed:  A review of her medication history shows that she has been on the following medications which are also used in migraine treatment: Elavil, atenolol, baclofen, fioricet, flexeril, lexapro, zofran magnesium nurontin,labetalol, prednisone, phenergan,   Sleep study showed minimal obstructive sleep apnea with no desaturations, mild snoring, some difficulty initiating sleep and prolonged mid sleep cycle awakening . 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   Cbc, cmp,tsh, hgba1c unremarkable  REVIEW OF SYSTEMS: Out of a complete 14 system review of symptoms, the patient complains only of the following symptoms, and all other reviewed systems are negative.  Headache, depression, nervous/anxious, swollen lymph nodes, environmental allergies, frequent infections, urine decreased, back pain, aching muscles, neck stiffness, moles, itching, sleep talking, daytime  sleepiness, rectal pain, rectal bleeding, excessive thirst, palpitations, cough, blurred vision, light sensitivity, eye itching, fatigue, excessive sweating, runny nose, trouble swallowing  ALLERGIES: No Known  Allergies  HOME MEDICATIONS: Outpatient Prescriptions Prior to Visit  Medication Sig Dispense Refill  . ALPRAZolam (XANAX) 1 MG tablet TAKE ONE TABLET TWICE DAILY AS NEEDED 60 tablet 2  . amphetamine-dextroamphetamine (ADDERALL) 10 MG tablet Take 1 tablet (10 mg total) by mouth 2 (two) times daily with a meal. 60 tablet 0  . atenolol (TENORMIN) 50 MG tablet TAKE ONE TABLET AT BEDTIME 90 tablet 1  . azelastine (ASTELIN) 0.1 % nasal spray PLACE 2 SPRAYS INTO BOTH NOSTRILS 2 TIMES DAILY- USE IN EACH NOSTRIL AS DIRECTED 30 mL 3  . bifidobacterium infantis (ALIGN) capsule Take 1 capsule by mouth daily.    . Biotin 10 MG TABS Take by mouth daily.    . cetirizine (ZYRTEC) 10 MG tablet Take 10 mg by mouth daily as needed for allergies.    . Chlorphen-PE-Acetaminophen 4-10-325 MG TABS Take 1 tablet by mouth 3 (three) times daily as needed. 30 tablet 0  . Cholecalciferol (VITAMIN D3) 10000 units capsule Take 10,000 Units by mouth daily.    Marland Kitchen co-enzyme Q-10 30 MG capsule Take 30 mg by mouth daily.    . DULoxetine (CYMBALTA) 30 MG capsule Take 1 capsule (30 mg total) by mouth daily. 30 capsule 12  . metoCLOPramide (REGLAN) 10 MG tablet Take at onset of migraine. Can take it every 6 hours if needed. 10 tablet 12  . Multiple Vitamins-Calcium (ONE-A-DAY WOMENS FORMULA PO) Take by mouth daily.    . Norethindrone Acetate-Ethinyl Estrad-FE (LOESTRIN 24 FE) 1-20 MG-MCG(24) tablet   5  . omeprazole (PRILOSEC) 40 MG capsule TAKE ONE CAPSULE EACH DAY 30 capsule 2  . OVER THE COUNTER MEDICATION 1 tablet daily. Tumeric    . predniSONE (DELTASONE) 20 MG tablet 3 tabs po daily x 3 days, then 2 tabs x 3 days, then 1.5 tabs x 3 days, then 1 tab x 3 days, then 0.5 tabs x 3 days 27 tablet 0  . predniSONE (DELTASONE) 20 MG tablet 3 tabs po daily x 3 days, then 2 tabs x 3 days, then 1.5 tabs x 3 days, then 1 tab x 3 days, then 0.5 tabs x 3 days 27 tablet 0  . PROCTOZONE-HC 2.5 % rectal cream APPLY RECTALLY TWICE DAILY AS  NEEDED AFTER BOWEL MOVEMENT 30 g 3  . ranitidine (ZANTAC) 300 MG tablet Take 1 tablet (300 mg total) by mouth at bedtime. 30 tablet 1  . vitamin B-12 (CYANOCOBALAMIN) 1000 MCG tablet Take 1,000 mcg by mouth every other day.    . levofloxacin (LEVAQUIN) 500 MG tablet Take 1 tablet (500 mg total) by mouth daily. (Patient not taking: Reported on 02/19/2016) 10 tablet 0   No facility-administered medications prior to visit.    PAST MEDICAL HISTORY: Past Medical History  Diagnosis Date  . Hypertension   . Headache(784.0)     migraines  . IBS (irritable bowel syndrome)   . Seasonal allergies   . Insulin resistance   . Anxiety   . Depression     PAST SURGICAL HISTORY: Past Surgical History  Procedure Laterality Date  . Wisdom tooth extraction    . Cesarean section N/A 06/05/2013    Procedure: Primary cesarean section with delivery of baby boy at 1033.  Apgars 1/8.;  Surgeon: Genia Del, MD;  Location: [facility];  Service: Obstetrics;  Laterality: N/A;  FAMILY HISTORY: Family History  Problem Relation Age of Onset  . Migraines Neg Hx   . Diabetes Mother   . Hypertension Father     SOCIAL HISTORY: Social History   Social History  . Marital Status: Married    Spouse Name: N/A  . Number of Children: N/A  . Years of Education: N/A   Occupational History  . Not on file.   Social History Main Topics  . Smoking status: Never Smoker   . Smokeless tobacco: Never Used  . Alcohol Use: No  . Drug Use: No  . Sexual Activity: Yes   Other Topics Concern  . Not on file   Social History Narrative      PHYSICAL EXAM  Filed Vitals:   02/19/16 1547  BP: 108/73  Pulse: 81  Height:  (1.626 m)  Weight: 213 lb 6.4 oz (96.798 kg)   Body mass index is 36.61 kg/(m^2).  Generalized: Well developed, in no acute distress   Neurological examination  Mentation: Alert oriented to time, place, history taking. Follows all commands speech and language fluent Cranial nerve  II-XII: Pupils were equal round reactive to light. Extraocular movements were full, visual field were full on confrontational test. Facial sensation and strength were normal. Uvula tongue midline. Head turning and shoulder shrug  were normal and symmetric. Motor: The motor testing reveals 5 over 5 strength of all 4 extremities. Good symmetric motor tone is noted throughout.  Sensory: Sensory testing is intact to soft touch on all 4 extremities. No evidence of extinction is noted.  Coordination: Cerebellar testing reveals good finger-nose-finger and heel-to-shin bilaterally.  Gait and station: Gait is normal. Tandem gait is normal. Romberg is negative. No drift is seen.  Reflexes: Deep tendon reflexes are symmetric and normal bilaterally.   DIAGNOSTIC DATA (LABS, IMAGING, TESTING) - I reviewed patient records, labs, notes, testing and imaging myself where available.  Lab Results  Component Value Date   WBC 10.6* 07/16/2015   HGB 14.6 07/16/2015   HCT 42.5 07/16/2015   MCV 88.2 07/16/2015   PLT 306 07/16/2015      Component Value Date/Time   NA 136 07/16/2015 1647   K 4.7 07/16/2015 1647   CL 98 07/16/2015 1647   CO2 32* 07/16/2015 1647   GLUCOSE 94 07/16/2015 1647   BUN 14 07/16/2015 1647   CREATININE 0.89 07/16/2015 1647   CREATININE 0.86 11/09/2013 1521   CALCIUM 10.5* 07/16/2015 1647   PROT 7.4 07/16/2015 1647   ALBUMIN 4.0 07/16/2015 1647   AST 21 07/16/2015 1647   ALT 25 07/16/2015 1647   ALKPHOS 82 07/16/2015 1647   BILITOT 0.5 07/16/2015 1647   GFRNONAA 88 07/16/2015 1647   GFRNONAA >90 11/09/2013 1521   GFRAA >89 07/16/2015 1647   GFRAA >90 11/09/2013 1521   Lab Results  Component Value Date   CHOL 195 07/16/2015   HDL 48 07/16/2015   LDLCALC 104 07/16/2015   TRIG 217* 07/16/2015   CHOLHDL 4.1 07/16/2015   Lab Results  Component Value Date   HGBA1C 5.5 07/16/2015   Lab Results  Component Value Date   VITAMINB12 548 04/11/2015   Lab Results  Component  Value Date   TSH 1.170 07/16/2015      ASSESSMENT AND PLAN 30 y.o. year old female  has a past medical history of Hypertension; Headache(784.0); IBS (irritable bowel syndrome); Seasonal allergies; Insulin resistance; Anxiety; and Depression. here with:  1. Headaches  We will increase the patient's Cymbalta to 40  mg daily. She was advised that following up with a therapist or psychiatrist may be of benefit. Patient verbalized understanding. She has been advised that if her headache and/or severity worsens she should let us know. She will follow-up in 3 months or sooner if needed.     Butch PennyMegan Nuel Dejaynes, MSN, NP-C 02/19/2016, 3:54 PM [facility] [address], [address] [place], [place] [address] [phone]    Personally participated in and made any corrections needed to history, physical, neuro exam,assessment and plan as stated above, evaluated lab date, reviewed imaging studies and agree with radiology interpretations.   Naomie DeanAntonia Ahern, MD [facility]

## 2016-03-05 ENCOUNTER — Other Ambulatory Visit: Payer: Self-pay | Admitting: Internal Medicine

## 2016-03-06 ENCOUNTER — Other Ambulatory Visit: Payer: Self-pay | Admitting: Physician Assistant

## 2016-03-06 DIAGNOSIS — F411 Generalized anxiety disorder: Secondary | ICD-10-CM

## 2016-03-12 ENCOUNTER — Encounter: Payer: Self-pay | Admitting: Adult Health

## 2016-03-13 ENCOUNTER — Encounter: Payer: Self-pay | Admitting: Physician Assistant

## 2016-03-13 ENCOUNTER — Other Ambulatory Visit: Payer: Self-pay | Admitting: Physician Assistant

## 2016-03-13 MED ORDER — FLUOXETINE HCL 20 MG PO CAPS: 20.0000 mg | ORAL_CAPSULE | Freq: Every day | ORAL | Status: DC

## 2016-03-13 NOTE — Telephone Encounter (Signed)
I called the patient- no answer. Left a message.

## 2016-03-13 NOTE — Telephone Encounter (Signed)
I called the patient on her mobile. Left a voice message. She can return my call at her convenience

## 2016-03-13 NOTE — Telephone Encounter (Signed)
Patient is returning your call. She can be reached at [phone].

## 2016-03-18 ENCOUNTER — Encounter: Payer: Self-pay | Admitting: Allergy and Immunology

## 2016-03-18 ENCOUNTER — Ambulatory Visit (INDEPENDENT_AMBULATORY_CARE_PROVIDER_SITE_OTHER): Payer: BLUE CROSS/BLUE SHIELD | Admitting: Allergy and Immunology

## 2016-03-18 VITALS — BP 128/88 | HR 80 | Temp 98.3°F | Resp 18 | Ht 63.0 in | Wt 212.8 lb

## 2016-03-18 DIAGNOSIS — H1010 Acute atopic conjunctivitis, unspecified eye: Secondary | ICD-10-CM

## 2016-03-18 DIAGNOSIS — J309 Allergic rhinitis, unspecified: Secondary | ICD-10-CM

## 2016-03-18 MED ORDER — AZELASTINE HCL 0.05 % OP SOLN: 1.0000 [drp] | Freq: Two times a day (BID) | OPHTHALMIC | Status: DC

## 2016-03-18 MED ORDER — MONTELUKAST SODIUM 10 MG PO TABS: 10.0000 mg | ORAL_TABLET | Freq: Every day | ORAL | Status: DC

## 2016-03-18 MED ORDER — OLOPATADINE HCL 0.7 % OP SOLN: 1.0000 [drp] | OPHTHALMIC | Status: DC

## 2016-03-18 NOTE — Patient Instructions (Addendum)
  1. Allergen avoidance measures  2. Treat and prevent inflammation:   A. OTC Rhinocort one spray each nostril once a day. Coupon.  B. montelukast 10 mg one tablet one time per day  3. If needed:   A. OTC antihistamine - Claritin/Zyrtec/Allegra  B. nasal saline spray  C. Pazeo one drop each eye twice a day  4. Consider a course of immunotherapy  5. Return to clinic in 3 weeks or earlier if problem

## 2016-03-18 NOTE — Progress Notes (Signed)
Dear Terri Piedra,  Thank you for referring Eileen Rios to the [facility] on 03/18/2016.   Below is a summation of this patient's evaluation and recommendations.  Thank you for your referral. I will keep you informed about this patient's response to treatment.   If you have any questions please to do hestitate to contact me.   Sincerely,  Jessica Priest, MD [facility]   ______________________________________________________________________    NEW PATIENT NOTE  Referring Provider: Terri Piedra, PA-C Primary Provider: Nadean Corwin, MD Date of office visit: 03/18/2016    Subjective:   Chief Complaint:  Zoha Spranger (DOB: [birth year]) is a 30 y.o. female with a chief complaint of New Patient (Initial Visit)  who presents to the clinic on 03/18/2016 with the following problems:  HPI: Christyana presents to this clinic in evaluation of allergies. She has a long history of spring and fall allergies that were not a particularly big problem over the course of the past several years until this spring. This spring she has had constant sneezing and nasal congestion and postnasal drip and clear rhinorrhea and itchy eyes and itchy ears and itchy swollen throat requiring the administration of systemic steroids at least 6 times causing her to gain 15 pounds of weight. She does have a pressure-like frontal headache but no anosmia or ugly nasal discharge. She has difficulty using most nasal steroid sprays because of migraine headaches. There is no obvious provoking factors giving rise to her symptoms.  Past Medical History  Diagnosis Date  . Hypertension   . Headache(784.0)     migraines  . IBS (irritable bowel syndrome)   . Seasonal allergies   . Insulin resistance   . Anxiety   . Depression   . Urticaria     Past Surgical History  Procedure Laterality Date  . Wisdom  tooth extraction    . Cesarean section N/A 06/05/2013    Procedure: Primary cesarean section with delivery of baby boy at 1033.  Apgars 1/8.;  Surgeon: Genia Del, MD;  Location: [facility];  Service: Obstetrics;  Laterality: N/A;      Medication List           ALPRAZolam 1 MG tablet  Commonly known as:  XANAX  Take 1/2 to 1 tablet 1 to 2 x /day if needed for anxiety     amphetamine-dextroamphetamine 10 MG tablet  Commonly known as:  ADDERALL  Take 1 tablet (10 mg total) by mouth 2 (two) times daily with a meal.     atenolol 50 MG tablet  Commonly known as:  TENORMIN  TAKE ONE TABLET AT BEDTIME     azelastine 0.05 % ophthalmic solution  Commonly known as:  OPTIVAR  Place 1 drop into both eyes 2 (two) times daily.     Biotin 10 MG Tabs  Take by mouth daily.     cetirizine 10 MG tablet  Commonly known as:  ZYRTEC  Take 10 mg by mouth daily as needed for allergies.     Chlorphen-PE-Acetaminophen 4-10-325 MG Tabs  Take 1 tablet by mouth 3 (three) times daily as needed.     co-enzyme Q-10 30 MG capsule  Take 30 mg by mouth daily.     DULoxetine 20 MG capsule  Commonly known as:  CYMBALTA  Take 2 capsules (40 mg total) by mouth daily.     FLUoxetine 20 MG capsule  Commonly known  as:  PROZAC  Take 1 capsule (20 mg total) by mouth daily.     metoCLOPramide 10 MG tablet  Commonly known as:  REGLAN  Take at onset of migraine. Can take it every 6 hours if needed.     montelukast 10 MG tablet  Commonly known as:  SINGULAIR  Take 1 tablet (10 mg total) by mouth at bedtime.     Norethindrone Acetate-Ethinyl Estrad-FE 1-20 MG-MCG(24) tablet  Commonly known as:  LOESTRIN 24 FE     omeprazole 40 MG capsule  Commonly known as:  PRILOSEC  TAKE ONE CAPSULE EACH DAY     ONE-A-DAY WOMENS FORMULA PO  Take by mouth daily.     OVER THE COUNTER MEDICATION  1 tablet daily. Tumeric     PROCTOZONE-HC 2.5 % rectal cream  Generic drug:  hydrocortisone  APPLY RECTALLY TWICE  DAILY AS NEEDED AFTER BOWEL MOVEMENT     ranitidine 300 MG tablet  Commonly known as:  ZANTAC  TAKE ONE TABLET AT BEDTIME     vitamin B-12 1000 MCG tablet  Commonly known as:  CYANOCOBALAMIN  Take 1,000 mcg by mouth every other day.     Vitamin D3 10000 units capsule  Take 10,000 Units by mouth daily.        No Known Allergies  Review of systems negative except as noted in HPI / PMHx or noted below:  Review of Systems  Constitutional: Negative.   HENT: Negative.   Eyes: Negative.   Respiratory: Negative.   Cardiovascular: Negative.   Gastrointestinal: Negative.   Genitourinary: Negative.   Musculoskeletal: Negative.   Skin: Negative.   Neurological: Negative.   Endo/Heme/Allergies: Negative.   Psychiatric/Behavioral: Negative.     Family History  Problem Relation Age of Onset  . Migraines Neg Hx   . Diabetes Mother   . Hypertension Father     Social History   Social History  . Marital Status: Married    Spouse Name: N/A  . Number of Children: N/A  . Years of Education: N/A   Occupational History  . Not on file.   Social History Main Topics  . Smoking status: Never Smoker   . Smokeless tobacco: Never Used  . Alcohol Use: Yes  . Drug Use: No  . Sexual Activity: Yes   Other Topics Concern  . 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  HENT:  Head: Normocephalic.  Right Ear: Tympanic membrane, external ear and ear canal normal.  Left Ear: Tympanic membrane, external ear  and ear canal normal.  Nose: Mucosal edema present. No rhinorrhea.  Mouth/Throat: Uvula is midline, oropharynx is clear and moist and mucous membranes are normal. No oropharyngeal exudate.  Eyes: Conjunctivae are normal.  Neck: Trachea normal. No tracheal tenderness present. No tracheal deviation present. No thyromegaly present.  Cardiovascular: Normal rate, regular rhythm, S1 normal, S2 normal and normal heart sounds.   No murmur heard. Pulmonary/Chest: Breath sounds normal.  No stridor. No respiratory distress. She has no wheezes. She has no rales.  Musculoskeletal: She exhibits no edema.  Lymphadenopathy:       Head (right side): No tonsillar adenopathy present.       Head (left side): No tonsillar adenopathy present.    She has no cervical adenopathy.  Neurological: She is alert. Gait normal.  Skin: No rash noted. She is not diaphoretic. No erythema. Nails show no clubbing.  Psychiatric: Mood and affect normal.     Diagnostics: Allergy skin tests were performed. She demonstrated hypersensitivity against house dust mite    Assessment and Plan:    1. Allergic rhinoconjunctivitis     1. Allergen avoidance measures  2. Treat and prevent inflammation:   A. OTC Rhinocort one spray each nostril once a day. Coupon.  B. montelukast 10 mg one tablet one time per day  3. If needed:   A. OTC antihistamine - Claritin/Zyrtec/Allegra  B. nasal saline spray  C. Pazeo one drop each eye twice a day  4. Consider a course of immunotherapy  5. Return to clinic in 3 weeks or earlier if problem  Safiyah has a atopic immune system manifested as allergic rhinoconjunctivitis that should come under pretty good control with attention to allergen avoidance measures and the consistent use of some anti-inflammatory medications. If she fails medical therapy she would definitely be a candidate for immunotherapy and I did give her literature on this form of treatment during today's visit. I'll regroup  with her over the course of the next 3-4 weeks to assess her response to therapy and consider further evaluation and treatment pending her response.  Jessica Priest, MD [facility]

## 2016-04-04 ENCOUNTER — Encounter: Payer: Self-pay | Admitting: Physician Assistant

## 2016-04-09 MED ORDER — AMPHETAMINE-DEXTROAMPHETAMINE 10 MG PO TABS: 10.0000 mg | ORAL_TABLET | Freq: Two times a day (BID) | ORAL | Status: DC

## 2016-04-10 ENCOUNTER — Ambulatory Visit (INDEPENDENT_AMBULATORY_CARE_PROVIDER_SITE_OTHER): Payer: BLUE CROSS/BLUE SHIELD | Admitting: Physician Assistant

## 2016-04-10 ENCOUNTER — Encounter: Payer: Self-pay | Admitting: Physician Assistant

## 2016-04-10 VITALS — BP 130/84 | HR 101 | Temp 98.1°F | Resp 16 | Ht 63.0 in | Wt 214.0 lb

## 2016-04-10 DIAGNOSIS — R51 Headache: Secondary | ICD-10-CM

## 2016-04-10 DIAGNOSIS — J309 Allergic rhinitis, unspecified: Secondary | ICD-10-CM

## 2016-04-10 DIAGNOSIS — R519 Headache, unspecified: Secondary | ICD-10-CM

## 2016-04-10 DIAGNOSIS — J329 Chronic sinusitis, unspecified: Secondary | ICD-10-CM | POA: Diagnosis not present

## 2016-04-10 MED ORDER — KETOROLAC TROMETHAMINE 30 MG/ML IJ SOLN
60.0000 mg | Freq: Once | INTRAMUSCULAR | Status: AC
  Administered 2016-04-10: 60 mg via INTRAMUSCULAR

## 2016-04-10 NOTE — Progress Notes (Signed)
Subjective:    Patient ID: Eileen Rios, female    DOB: [birth year], [age] y.o.   MRN: [id number]  HPI 30 y.o. WF presents with allergies/migraines. She has seen an allergist and neurologist. She states that she is doing okay on the prozac 20mg , no side effects, she is off the cymbalta for now but may reintroduce it. She is doing better on the singulair, zyrtec. She woke up with a sore throat this AM and is suppose to visit a friends new born baby but does not want to go.    She is getting colonoscopy the 25th.   Blood pressure 130/84, pulse 101, temperature 98.1 F (36.7 C), temperature source Temporal, resp. rate 16, height 5\' 3"  (1.6 m), weight 214 lb (97.07 kg), SpO2 97 %.  Past Medical History  Diagnosis Date  . Hypertension   . Headache(784.0)     migraines  . IBS (irritable bowel syndrome)   . Seasonal allergies   . Insulin resistance   . Anxiety   . Depression   . Urticaria    Current Outpatient Prescriptions on File Prior to Visit  Medication Sig Dispense Refill  . ALPRAZolam (XANAX) 1 MG tablet Take 1/2 to 1 tablet 1 to 2 x /day if needed for anxiety 60 tablet 1  . amphetamine-dextroamphetamine (ADDERALL) 10 MG tablet Take 1 tablet (10 mg total) by mouth 2 (two) times daily with a meal. 60 tablet 0  . atenolol (TENORMIN) 50 MG tablet TAKE ONE TABLET AT BEDTIME 90 tablet 1  . azelastine (OPTIVAR) 0.05 % ophthalmic solution Place 1 drop into both eyes 2 (two) times daily. 6 mL 5  . Biotin 10 MG TABS Take by mouth daily.    . cetirizine (ZYRTEC) 10 MG tablet Take 10 mg by mouth daily as needed for allergies.    . Chlorphen-PE-Acetaminophen 4-10-325 MG TABS Take 1 tablet by mouth 3 (three) times daily as needed. 30 tablet 0  . Cholecalciferol (VITAMIN D3) 10000 units capsule Take 10,000 Units by mouth daily.    Marland Kitchen. co-enzyme Q-10 30 MG capsule Take 30 mg by mouth daily.    Marland Kitchen. FLUoxetine (PROZAC) 20 MG capsule Take 1 capsule (20 mg total) by mouth daily. 60 capsule 0  .  metoCLOPramide (REGLAN) 10 MG tablet Take at onset of migraine. Can take it every 6 hours if needed. 10 tablet 12  . montelukast (SINGULAIR) 10 MG tablet Take 1 tablet (10 mg total) by mouth at bedtime. 30 tablet 5  . Multiple Vitamins-Calcium (ONE-A-DAY WOMENS FORMULA PO) Take by mouth daily.    . Norethindrone Acetate-Ethinyl Estrad-FE (LOESTRIN 24 FE) 1-20 MG-MCG(24) tablet   5  . omeprazole (PRILOSEC) 40 MG capsule TAKE ONE CAPSULE EACH DAY 30 capsule 2  . OVER THE COUNTER MEDICATION 1 tablet daily. Tumeric    . PROCTOZONE-HC 2.5 % rectal cream APPLY RECTALLY TWICE DAILY AS NEEDED AFTER BOWEL MOVEMENT 30 g 3  . ranitidine (ZANTAC) 300 MG tablet TAKE ONE TABLET AT BEDTIME 30 tablet 1  . vitamin B-12 (CYANOCOBALAMIN) 1000 MCG tablet Take 1,000 mcg by mouth every other day.    . [DISCONTINUED] Drospirenone-Ethinyl Estradiol-Levomefol (SAFYRAL) 3-0.03-0.451 MG tablet Take 1 tablet by mouth daily. 1 Package 3   No current facility-administered medications on file prior to visit.    Review of Systems  Constitutional: Negative for chills and diaphoresis.  HENT: Positive for congestion, postnasal drip, sinus pressure and sneezing. Negative for ear pain and sore throat.   Respiratory: Negative for cough,  chest tightness, shortness of breath and wheezing.   Cardiovascular: Negative.   Gastrointestinal: Negative.   Genitourinary: Negative.   Musculoskeletal: Negative for neck pain.  Neurological: Positive for headaches.       Objective:   Physical Exam  Constitutional: She appears well-developed and well-nourished.  HENT:  Head: Normocephalic and atraumatic.  Right Ear: External ear normal.  Nose: Right sinus exhibits maxillary sinus tenderness. Right sinus exhibits no frontal sinus tenderness. Left sinus exhibits maxillary sinus tenderness. Left sinus exhibits no frontal sinus tenderness.  Eyes: Conjunctivae and EOM are normal.  Neck: Normal range of motion. Neck supple.  Cardiovascular:  Normal rate, regular rhythm, normal heart sounds and intact distal pulses.   Pulmonary/Chest: Effort normal and breath sounds normal. No respiratory distress. She has no wheezes.  Abdominal: Soft. Bowel sounds are normal.  Lymphadenopathy:    She has no cervical adenopathy.  Skin: Skin is warm and dry.       Assessment & Plan:  1. Allergic rhinitis, unspecified allergic rhinitis type Add back on nasocort but only 2 x a week due to headache  2. Chronic sinusitis, unspecified location No meds at this time, no fever/chills, take allergy mes  3. Nonintractable episodic headache, unspecified headache type Will give samples of zembrace  4. Depression Continue prozac for now

## 2016-04-22 ENCOUNTER — Other Ambulatory Visit: Payer: Self-pay | Admitting: Physician Assistant

## 2016-04-23 ENCOUNTER — Encounter: Payer: Self-pay | Admitting: Physician Assistant

## 2016-04-24 MED ORDER — SUMATRIPTAN SUCCINATE 6 MG/0.5ML ~~LOC~~ SOLN: SUBCUTANEOUS | Status: DC

## 2016-04-24 MED ORDER — "INSULIN SYRINGE-NEEDLE U-100 31G X 15/64"" 0.5 ML MISC": Status: DC

## 2016-05-06 ENCOUNTER — Ambulatory Visit: Payer: BLUE CROSS/BLUE SHIELD | Admitting: Allergy and Immunology

## 2016-05-09 ENCOUNTER — Other Ambulatory Visit: Payer: Self-pay | Admitting: Internal Medicine

## 2016-05-15 ENCOUNTER — Other Ambulatory Visit: Payer: Self-pay | Admitting: Physician Assistant

## 2016-05-15 DIAGNOSIS — F411 Generalized anxiety disorder: Secondary | ICD-10-CM

## 2016-05-15 MED ORDER — ALPRAZOLAM 1 MG PO TABS: ORAL_TABLET | ORAL | 1 refills | Status: DC

## 2016-05-27 ENCOUNTER — Encounter: Payer: Self-pay | Admitting: Physician Assistant

## 2016-05-27 ENCOUNTER — Other Ambulatory Visit: Payer: Self-pay | Admitting: Internal Medicine

## 2016-05-27 MED ORDER — AMPHETAMINE-DEXTROAMPHETAMINE 10 MG PO TABS: 10.0000 mg | ORAL_TABLET | Freq: Two times a day (BID) | ORAL | 0 refills | Status: DC

## 2016-05-28 ENCOUNTER — Encounter: Payer: Self-pay | Admitting: Adult Health

## 2016-05-28 ENCOUNTER — Ambulatory Visit (INDEPENDENT_AMBULATORY_CARE_PROVIDER_SITE_OTHER): Payer: BLUE CROSS/BLUE SHIELD | Admitting: Adult Health

## 2016-05-28 VITALS — BP 110/78 | HR 82 | Ht 63.0 in | Wt 211.4 lb

## 2016-05-28 DIAGNOSIS — R519 Headache, unspecified: Secondary | ICD-10-CM

## 2016-05-28 DIAGNOSIS — R51 Headache: Secondary | ICD-10-CM | POA: Diagnosis not present

## 2016-05-28 NOTE — Patient Instructions (Signed)
Improve Sleep Hygiene: - limit exposure to TV, ipad, phone before bedtime - Take a hot shower before bedtime - Sleep cold, dark room - No caffeine after 12:00   Ok to take naps if it prevents migraines

## 2016-05-28 NOTE — Progress Notes (Addendum)
PATIENT: Eileen Rios DOB: Oct 26, 1985  REASON FOR VISIT: follow up- headache HISTORY FROM: patient  HISTORY OF PRESENT ILLNESS: Eileen Rios is a 30 year old female with a history of headaches. She returns today for follow-up. At the last visit Cymbalta was increased to 40 mg daily. She reports that she was unable to tolerate Cymbalta. Her primary care's switched her Prozac. She states this has been working better for her. She states that she has found that if she takes a 3-4 hour nap every couple days this prevents her from having migraines. She is also wondering what to do to improve her sleep hygiene at night so that would not be necessary. She denies any new neurological symptoms. He returns today for an evaluation.  HISTORY 02/19/16:Eileen Rios is a 30 year old female with a history of headaches. She returns today for follow-up. She states that she continues to have daily headaches. At the last visit her Cymbalta was increased however s he states that she was already on 30 mg. She states that she does have severe allergies and is on several medications. She feels that some of her headaches may be related to her allergies. She also states that she is under a lot of stress and has a history of obsessive-compulsive disorder. She also reports that she has a sleep disorder. 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 She has tried sleep agents but could not tolerate them. She is on Adderall to help with daytime sleepiness. She states that she only gets 1 true migraine a month. Most of her headaches are located in the temporal region. With severe headaches she does have photophobia and phonophobia and some nausea. She states that only oxycodone will help her severe headaches. She states that she will be seeing a therapist to help her deal with her stress. She also reports that she will be seen in ophthalmologist to be evaluated for glaucoma in the right eye. She returns today  for an evaluation.   REVIEW OF SYSTEMS: Out of a complete 14 system review of symptoms, the patient complains only of the following symptoms, and all other reviewed systems are negative.  Blurred vision, fatigue, excessive sweating, daytime sleepiness, depression, nervous/anxious, headache  ALLERGIES: No Known Allergies  HOME MEDICATIONS: Outpatient Medications Prior to Visit  Medication Sig Dispense Refill  . ALPRAZolam (XANAX) 1 MG tablet Take 1/2 to 1 tablet 1 to 2 x /day if needed for anxiety 60 tablet 1  . amphetamine-dextroamphetamine (ADDERALL) 10 MG tablet Take 1 tablet (10 mg total) by mouth 2 (two) times daily with a meal. 60 tablet 0  . atenolol (TENORMIN) 50 MG tablet TAKE ONE TABLET AT BEDTIME 90 tablet 1  . azelastine (OPTIVAR) 0.05 % ophthalmic solution Place 1 drop into both eyes 2 (two) times daily. 6 mL 5  . Biotin 10 MG TABS Take by mouth daily.    . cetirizine (ZYRTEC) 10 MG tablet Take 10 mg by mouth daily as needed for allergies.    . Chlorphen-PE-Acetaminophen 4-10-325 MG TABS Take 1 tablet by mouth 3 (three) times daily as needed. 30 tablet 0  . Cholecalciferol (VITAMIN D3) 10000 units capsule Take 10,000 Units by mouth daily.    Marland Kitchen. co-enzyme Q-10 30 MG capsule Take 30 mg by mouth daily.    Marland Kitchen. FLUoxetine (PROZAC) 20 MG capsule TAKE ONE CAPSULE EACH DAY 60 capsule 2  . Insulin Syringe-Needle U-100 31G X 15/64" 0.5 ML MISC Use two pens daily with  insulin 90 each 0  . metoCLOPramide (REGLAN) 10 MG tablet Take at onset of migraine. Can take it every 6 hours if needed. 10 tablet 12  . montelukast (SINGULAIR) 10 MG tablet Take 1 tablet (10 mg total) by mouth at bedtime. 30 tablet 5  . Multiple Vitamins-Calcium (ONE-A-DAY WOMENS FORMULA PO) Take by mouth daily.    . Norethindrone Acetate-Ethinyl Estrad-FE (LOESTRIN 24 FE) 1-20 MG-MCG(24) tablet   5  . omeprazole (PRILOSEC) 40 MG capsule TAKE ONE CAPSULE EACH DAY 30 capsule 2  . OVER THE COUNTER MEDICATION 1 tablet daily.  Tumeric    . PROCTOZONE-HC 2.5 % rectal cream APPLY RECTALLY TWICE DAILY AS NEEDED AFTER BOWEL MOVEMENT 30 g 3  . ranitidine (ZANTAC) 300 MG tablet TAKE ONE TABLET AT BEDTIME 30 tablet 2  . SUMAtriptan (IMITREX) 6 MG/0.5ML SOLN injection 0.715ml subQ for headache. May repeat in 2 hrs if headache persists, no more than 2 shots in 24 hrs. 8 vial 2  . vitamin B-12 (CYANOCOBALAMIN) 1000 MCG tablet Take 1,000 mcg by mouth every other day.     No facility-administered medications prior to visit.     PAST MEDICAL HISTORY: Past Medical History:  Diagnosis Date  . Anxiety   . Depression   . Headache(784.0)    migraines  . Hypertension   . IBS (irritable bowel syndrome)   . Insulin resistance   . Seasonal allergies   . Urticaria     PAST SURGICAL HISTORY: Past Surgical History:  Procedure Laterality Date  . CESAREAN SECTION N/A 06/05/2013   Procedure: Primary cesarean section with delivery of baby boy at 1033.  Apgars 1/8.;  Surgeon: Genia DelMarie-Lyne Lavoie, MD;  Location: [facility];  Service: Obstetrics;  Laterality: N/A;  . WISDOM TOOTH EXTRACTION      FAMILY HISTORY: Family History  Problem Relation Age of Onset  . Migraines Neg Hx   . Diabetes Mother   . Hypertension Father     SOCIAL HISTORY: Social History   Social History  . Marital status: Married    Spouse name: N/A  . Number of children: N/A  . Years of education: N/A   Occupational History  . Not on file.   Social History Main Topics  . Smoking status: Never Smoker  . Smokeless tobacco: Never Used  . Alcohol use Yes  . Drug use: No  . Sexual activity: Yes   Other Topics Concern  . Not on file   Social History Narrative  . No narrative on file      PHYSICAL EXAM  Vitals:   05/28/16 1518  BP: 110/78  Pulse: 82  Weight: 211 lb 6.4 oz (95.9 kg)  Height: 5\' 3"  (1.6 m)   Body mass index is 37.45 kg/m.  Generalized: Well developed, in no acute distress   Neurological examination  Mentation: Alert  oriented to time, place, history taking. Follows all commands speech and language fluent Cranial nerve II-XII: Pupils were equal round reactive to light. Extraocular movements were full, visual field were full on confrontational test. Facial sensation and strength were normal. Uvula tongue midline. Head turning and shoulder shrug  were normal and symmetric. Motor: The motor testing reveals 5 over 5 strength of all 4 extremities. Good symmetric motor tone is noted throughout.  Sensory: Sensory testing is intact to soft touch on all 4 extremities. No evidence of extinction is noted.  Coordination: Cerebellar testing reveals good finger-nose-finger and heel-to-shin bilaterally.  Gait and station: Gait is normal. Tandem gait is normal.  Romberg is negative. No drift is seen.  Reflexes: Deep tendon reflexes are symmetric and normal bilaterally.   DIAGNOSTIC DATA (LABS, IMAGING, TESTING) - I reviewed patient records, labs, notes, testing and imaging myself where available.  Lab Results  Component Value Date   WBC 10.6 (H) 07/16/2015   HGB 14.6 07/16/2015   HCT 42.5 07/16/2015   MCV 88.2 07/16/2015   PLT 306 07/16/2015      Component Value Date/Time   NA 136 07/16/2015 1647   K 4.7 07/16/2015 1647   CL 98 07/16/2015 1647   CO2 32 (H) 07/16/2015 1647   GLUCOSE 94 07/16/2015 1647   BUN 14 07/16/2015 1647   CREATININE 0.89 07/16/2015 1647   CALCIUM 10.5 (H) 07/16/2015 1647   PROT 7.4 07/16/2015 1647   ALBUMIN 4.0 07/16/2015 1647   AST 21 07/16/2015 1647   ALT 25 07/16/2015 1647   ALKPHOS 82 07/16/2015 1647   BILITOT 0.5 07/16/2015 1647   GFRNONAA 88 07/16/2015 1647   GFRAA >89 07/16/2015 1647   Lab Results  Component Value Date   CHOL 195 07/16/2015   HDL 48 07/16/2015   LDLCALC 104 07/16/2015   TRIG 217 (H) 07/16/2015   CHOLHDL 4.1 07/16/2015   Lab Results  Component Value Date   HGBA1C 5.5 07/16/2015   Lab Results  Component Value Date   VITAMINB12 548 04/11/2015   Lab  Results  Component Value Date   TSH 1.170 07/16/2015      ASSESSMENT AND PLAN 30 y.o. year old female  has a past medical history of Anxiety; Depression; Headache(784.0); Hypertension; IBS (irritable bowel syndrome); Insulin resistance; Seasonal allergies; and Urticaria. here with:  1. Headaches  The patient reports that her headaches seem to be prevented if she takes a nap every couple days. I have reviewed with the patient ways to improve her sleep hygiene. Encourage the patient to endorse a regular sleep routine. Okay for her to take naps if it helps prevent her from having migraines. For now we will not start any additional medication. Patient is amenable to this plan. She will follow-up in 6 months or sooner if needed.     Butch Penny, MSN, NP-C 05/28/2016, 3:18 PM [facility] [address], [address] [place], [place] [address] [phone]  Personally have participated in and made any corrections needed to history, physical, neuro exam,assessment and plan as stated above.  I have personally discussed with NP, evaluated lab date, reviewed imaging studies and agree with radiology interpretations.    Naomie Dean, MD [facility]

## 2016-06-03 ENCOUNTER — Ambulatory Visit (INDEPENDENT_AMBULATORY_CARE_PROVIDER_SITE_OTHER): Payer: BLUE CROSS/BLUE SHIELD | Admitting: Allergy and Immunology

## 2016-06-03 ENCOUNTER — Encounter: Payer: Self-pay | Admitting: Allergy and Immunology

## 2016-06-03 VITALS — BP 110/78 | HR 72 | Resp 20

## 2016-06-03 DIAGNOSIS — J309 Allergic rhinitis, unspecified: Secondary | ICD-10-CM

## 2016-06-03 DIAGNOSIS — H1010 Acute atopic conjunctivitis, unspecified eye: Secondary | ICD-10-CM | POA: Diagnosis not present

## 2016-06-03 NOTE — Patient Instructions (Signed)
  1. Continue to perform Allergen avoidance measures  2. Continue to Treat and prevent inflammation:   A. OTC Rhinocort one spray each nostril three times per week  B. montelukast 10 mg one tablet one time per day  3. If needed:   A. OTC antihistamine - Claritin/Zyrtec/Allegra  B. nasal saline spray  C. Pazeo one drop each eye twice a day  4. Consider a course of immunotherapy if medical therapy fails  5. Return to clinic in 1 year or earlier if problem  6. Obtain fall flu vaccine

## 2016-06-03 NOTE — Progress Notes (Signed)
Follow-up Note  Referring Provider: Lucky Cowboy, MD Primary Provider: Nadean Corwin, MD Date of Office Visit: 06/03/2016  Subjective:   Eileen Rios (DOB: Nov 24, 1985) is a 30 y.o. female [facility] in re-evaluation of the following:  HPI: Eileen Rios returns to this clinic in reevaluation of her allergic rhinoconjunctivitis addressed during her initial evaluation of 03/18/2016. While performing allergen avoidance measures against dust mite and consistently using montelukast she's had a very good response. She has very little issues with her nose or eyes. She cannot tolerate the Rhinocort for when she used it for one straight week she ended up getting a migraine.    Medication List      ALPRAZolam 1 MG tablet Commonly known as:  XANAX Take 1/2 to 1 tablet 1 to 2 x /day if needed for anxiety   amphetamine-dextroamphetamine 10 MG tablet Commonly known as:  ADDERALL Take 1 tablet (10 mg total) by mouth 2 (two) times daily with a meal.   atenolol 50 MG tablet Commonly known as:  TENORMIN TAKE ONE TABLET AT BEDTIME   azelastine 0.05 % ophthalmic solution Commonly known as:  OPTIVAR Place 1 drop into both eyes 2 (two) times daily.   Biotin 10 MG Tabs Take by mouth daily.   cetirizine 10 MG tablet Commonly known as:  ZYRTEC Take 10 mg by mouth daily as needed for allergies.   Chlorphen-PE-Acetaminophen 4-10-325 MG Tabs Take 1 tablet by mouth 3 (three) times daily as needed.   co-enzyme Q-10 30 MG capsule Take 30 mg by mouth daily.   FLUoxetine 40 MG capsule Commonly known as:  PROZAC Take 40 mg by mouth daily.   Insulin Syringe-Needle U-100 31G X 15/64" 0.5 ML Misc Use two pens daily with insulin   metoCLOPramide 10 MG tablet Commonly known as:  REGLAN Take at onset of migraine. Can take it every 6 hours if needed.   montelukast 10 MG tablet Commonly known as:  SINGULAIR Take 1 tablet (10 mg total) by  mouth at bedtime.   Norethindrone Acetate-Ethinyl Estrad-FE 1-20 MG-MCG(24) tablet Commonly known as:  LOESTRIN 24 FE   omeprazole 40 MG capsule Commonly known as:  PRILOSEC TAKE ONE CAPSULE EACH DAY   ONE-A-DAY WOMENS FORMULA PO Take by mouth daily.   OVER THE COUNTER MEDICATION 1 tablet daily. Tumeric   PROCTOZONE-HC 2.5 % rectal cream Generic drug:  hydrocortisone APPLY RECTALLY TWICE DAILY AS NEEDED AFTER BOWEL MOVEMENT   ranitidine 300 MG tablet Commonly known as:  ZANTAC TAKE ONE TABLET AT BEDTIME   SUMAtriptan 6 MG/0.5ML Soln injection Commonly known as:  IMITREX 0.44ml subQ for headache. May repeat in 2 hrs if headache persists, no more than 2 shots in 24 hrs.   vitamin B-12 1000 MCG tablet Commonly known as:  CYANOCOBALAMIN Take 1,000 mcg by mouth every other day.   Vitamin D3 10000 units capsule Take 10,000 Units by mouth daily.       Past Medical History:  Diagnosis Date  . Anxiety   . Depression   . Headache(784.0)    migraines  . Hypertension   . IBS (irritable bowel syndrome)   . Insulin resistance   . Seasonal allergies   . Urticaria     Past Surgical History:  Procedure Laterality Date  . CESAREAN SECTION N/A 06/05/2013   Procedure: Primary cesarean section with delivery of baby boy at 1033.  Apgars 1/8.;  Surgeon: Genia Del, MD;  Location: [facility];  Service: Obstetrics;  Laterality: N/A;  . WISDOM TOOTH EXTRACTION      No Known Allergies  Review of systems negative except as noted in HPI / PMHx or noted below:  Review of Systems  Constitutional: Negative.   HENT: Negative.   Eyes: Negative.   Respiratory: Negative.   Cardiovascular: Negative.   Gastrointestinal: Negative.   Genitourinary: Negative.   Musculoskeletal: Negative.   Skin: Negative.   Neurological: Negative.   Endo/Heme/Allergies: Negative.   Psychiatric/Behavioral: Negative.      Objective:   Vitals:   06/03/16 1534  BP: 110/78  Pulse: 72  Resp: 20            Physical Exam  Constitutional: She is well-developed, well-nourished, and in no distress.  HENT:  Head: Normocephalic.  Right Ear: Tympanic membrane, external ear and ear canal normal.  Left Ear: Tympanic membrane, external ear and ear canal normal.  Nose: Nose normal. No mucosal edema or rhinorrhea.  Mouth/Throat: Uvula is midline, oropharynx is clear and moist and mucous membranes are normal. No oropharyngeal exudate.  Eyes: Conjunctivae are normal.  Neck: Trachea normal. No tracheal tenderness present. No tracheal deviation present. No thyromegaly present.  Cardiovascular: Normal rate, regular rhythm, S1 normal, S2 normal and normal heart sounds.   No murmur heard. Pulmonary/Chest: Breath sounds normal. No stridor. No respiratory distress. She has no wheezes. She has no rales.  Musculoskeletal: She exhibits no edema.  Lymphadenopathy:       Head (right side): No tonsillar adenopathy present.       Head (left side): No tonsillar adenopathy present.    She has no cervical adenopathy.  Neurological: She is alert. Gait normal.  Skin: No rash noted. She is not diaphoretic. No erythema. Nails show no clubbing.  Psychiatric: Mood and affect normal.    Diagnostics: None   Assessment and Plan:   1. Allergic rhinoconjunctivitis     1. Continue to perform Allergen avoidance measures  2. Continue to Treat and prevent inflammation:   A. OTC Rhinocort one spray each nostril three times per week  B. montelukast 10 mg one tablet one time per day  3. If needed:   A. OTC antihistamine - Claritin/Zyrtec/Allegra  B. nasal saline spray  C. Pazeo one drop each eye twice a day  4. Consider a course of immunotherapy if medical therapy fails  5. Return to clinic in 1 year or earlier if problem  6. Obtain fall flu vaccine  Eileen Rios has done quite well on her current medical therapy. Should she develop a little bit more problem with her airway she can always reinstitute the use  of Rhinocort although at a lower dose with the hope of not precipitating a migraine. She could try this agent 3 times a week. If she fails medical therapy she would definitely be a candidate for immunotherapy and we have discussed this issue during her initial evaluation. I will see her back in this clinic in 1 year or earlier if there is a problem.  Laurette SchimkeEric Kozlow, MD [facility]

## 2016-06-14 ENCOUNTER — Other Ambulatory Visit: Payer: Self-pay | Admitting: Internal Medicine

## 2016-06-20 ENCOUNTER — Encounter: Payer: Self-pay | Admitting: Physician Assistant

## 2016-06-20 ENCOUNTER — Ambulatory Visit (INDEPENDENT_AMBULATORY_CARE_PROVIDER_SITE_OTHER): Payer: BLUE CROSS/BLUE SHIELD | Admitting: Physician Assistant

## 2016-06-20 VITALS — BP 126/74 | HR 96 | Temp 97.3°F | Resp 16 | Ht 63.0 in | Wt 208.8 lb

## 2016-06-20 DIAGNOSIS — J029 Acute pharyngitis, unspecified: Secondary | ICD-10-CM

## 2016-06-20 DIAGNOSIS — J069 Acute upper respiratory infection, unspecified: Secondary | ICD-10-CM

## 2016-06-20 LAB — POCT RAPID STREP A (OFFICE): RAPID STREP A SCREEN: NEGATIVE

## 2016-06-20 MED ORDER — PREDNISONE 20 MG PO TABS: ORAL_TABLET | ORAL | 0 refills | Status: DC

## 2016-06-20 MED ORDER — KETOROLAC TROMETHAMINE 60 MG/2ML IM SOLN
60.0000 mg | Freq: Once | INTRAMUSCULAR | Status: AC
  Administered 2016-06-20: 60 mg via INTRAMUSCULAR

## 2016-06-20 NOTE — Patient Instructions (Signed)
The majority of colds are caused by viruses and do not require antibiotics. Please read the rest of this hand out to learn more about the common cold and what you can do to help yourself as well as help prevent the over use of antibiotics.   COMMON COLD SIGNS AND SYMPTOMS - The common cold usually causes nasal congestion, runny nose, and sneezing. A sore throat may be present on the first day but usually resolves quickly. If a cough occurs, it generally develops on about the fourth or fifth day of symptoms, and is always the last thing to go away.   COMMON COLD COMPLICATIONS - In most cases, colds do not cause serious illness or complications. Most colds last for three to seven days, although many people continue to have symptoms (coughing, sneezing, congestion) for up to two weeks.  COMMON COLD TREATMENT - There is no specific treatment for the viruses that cause the common cold. Most treatments are aimed at relieving some of the symptoms of the cold, but do not shorten or cure the cold.   Antibiotics are not useful for treating the common cold; antibiotics are only used to treat illnesses caused by bacteria, not viruses. Unnecessary use of antibiotics for the treatment of the common cold can cause allergic reactions, diarrhea, or other gastrointestinal symptoms in some patients.   The symptoms of a cold will resolve over time, even without any treatment. People with underlying medical conditions and those who use other over-the-counter or prescription medications should speak with their healthcare provider or pharmacist to ensure that it is safe to use these treatments. The following are treatments that may reduce the symptoms caused by the common cold.  Nasal congestion - Decongestants are good for nasal congestion- if you feel very stuffy but no mucus is coming out, this is the medication that will help you the most.  Pseudoephedrine is a decongestant that can improve nasal congestion. Although a  prescription is not required, drugstores in the [place] keep pseudoephedrine behind the counter, so it must be requested from a pharmacist. If you have a heart condition or high blood pressure please use Coricidin BPH instead.   Runny nose - Antihistamines such as diphenhydramine (Benadryl), certazine (Zyrtec) which are best taking at night because they can make you tired OR loratadine (Claritin),  fexafinadine (Allegra) help with a runny nose.   Nasal sprays such an oxymetazoline (Afrin and others) may also give temporary relief of nasal congestion. However, these sprays should never be used for more than two to three days; use for more than three days use can worsen congestion.  Nasocort is now over the counter and can help decrease a runny nose. Please stop the medication if you have blurry vision or nose bleeds.   Sore throat and headache - Sore throat and headache are best treated with a mild pain reliever such as acetaminophen (Tylenol) or a non-steroidal anti-inflammatory agent such as ibuprofen or naproxen (Motrin or Aleve). These medications should be taken with food to prevent stomach problems. As well as gargling with warm water and salt.   Cough - Common cough medicine ingredients include guaifenesin and dextromethorphan; these are often combined with other medications in over-the-counter cold formulas. Often a cough is worse at night or first in the morning due to post nasal drip from you nose. You can try to sleep at an angle to decrease a cough.   Alternative treatments - Heated, humidified air can improve symptoms of nasal congestion and   runny nose, and causes few to no side effects. A number of alternative products, including vitamin C, doubling up on your vitamin D and herbal products such as echinacea, may help. Certain products, such as nasal gels that contain zinc (eg, Zicam), have been associated with a permanent loss of smell.  Antibiotics - Antibiotics should not be used to  treat an uncomplicated common cold. Often you need to give your body 7-10 days to fight off a common cold while treating the symptoms with the medications listed above. If after 7-10 days your symptoms are not improving, you are getting worse, you have shortness of breath, chest pain, a fever of over 103 you should seek medical help immediately.   PREVENTION IS THE BEST MEDICINE - Hand washing is an essential and highly effective way to prevent the spread of infection.  Alcohol-based hand rubs are a good alternative for disinfecting hands if a sink is not available.  Hands should be washed before preparing food and eating and after coughing, blowing the nose, or sneezing. While it is not always possible to limit contact with people who may be infected with a cold, touching the eyes, nose, or mouth after direct contact should be avoided when possible. Sneezing/coughing into the sleeve of one's clothing (at the inner elbow) is another means of containing sprays of saliva and secretions and does not contaminate the hands.    Influenza is a virus that does not respond to antibiotics. We can give you medications to treat the symptoms.   Tamiflu is a medication that can be given to prevent the flu if someone in your house has been diagnosed with the flu or is suppose to help decrease the duration of the flu. If taken within 2 days of symptoms it has been proven to only shorten the duration of the flu 30% of the time and only by 30% of the duration. In addition, the side effects of this medication can be just as bad as the flu and include:  -allergic reactions like skin rash, itching or hives, swelling of the face, lips, or tongue -anxiety, confusion, unusual behavior -breathing problems -hallucination, loss of contact with reality -redness, blistering, peeling or loosening of the skin, including inside the mouth -seizures Side effects that usually do not require medical attention (report to your doctor  or health care professional if they continue or are bothersome): -cough -diarrhea -dizziness -headache -nausea, vomiting -stomach pain

## 2016-06-20 NOTE — Progress Notes (Signed)
Subjective:    Patient ID: Eileen ScheuermannSamantha Rios, female    DOB: [birth year], 30 y.o.   MRN: [id number]  HPI 30 y.o. WF with history of recurrent sinusitis/headaches, following with neurology and an allergies presents with sore throat x 2 days. 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 Woke up with sore throat, nausea, and headache. Husband tested + for flu at urgent care last saturday, got tamiflu/prednisone.    Blood pressure 126/74, pulse 96, temperature 97.3 F (36.3 C), resp. rate 16, height 5\' 3"  (1.6 m), weight 208 lb 12.8 oz (94.7 kg), SpO2 98 %.  Medications Current Outpatient Prescriptions on File Prior to Visit  Medication Sig  . ALPRAZolam (XANAX) 1 MG tablet Take 1/2 to 1 tablet 1 to 2 x /day if needed for anxiety  . amphetamine-dextroamphetamine (ADDERALL) 10 MG tablet Take 1 tablet (10 mg total) by mouth 2 (two) times daily with a meal.  . atenolol (TENORMIN) 50 MG tablet TAKE ONE TABLET AT BEDTIME  . azelastine (OPTIVAR) 0.05 % ophthalmic solution Place 1 drop into both eyes 2 (two) times daily. (Patient taking differently: Place 1 drop into both eyes 2 (two) times daily as needed. )  . Biotin 10 MG TABS Take by mouth daily.  . cetirizine (ZYRTEC) 10 MG tablet Take 10 mg by mouth daily as needed for allergies.  . Chlorphen-PE-Acetaminophen 4-10-325 MG TABS Take 1 tablet by mouth 3 (three) times daily as needed.  . Cholecalciferol (VITAMIN D3) 10000 units capsule Take 10,000 Units by mouth daily.  Marland Kitchen. co-enzyme Q-10 30 MG capsule Take 30 mg by mouth daily.  Marland Kitchen. FLUoxetine (PROZAC) 40 MG capsule Take 40 mg by mouth daily.  . Insulin Syringe-Needle U-100 31G X 15/64" 0.5 ML MISC Use two pens daily with insulin  . metoCLOPramide (REGLAN) 10 MG tablet Take at onset of migraine. Can take it every 6 hours if needed.  . montelukast (SINGULAIR) 10 MG tablet Take 1 tablet (10 mg total) by mouth at bedtime.  . Multiple Vitamins-Calcium (ONE-A-DAY WOMENS FORMULA PO) Take by  mouth daily.  . Norethindrone Acetate-Ethinyl Estrad-FE (LOESTRIN 24 FE) 1-20 MG-MCG(24) tablet   . omeprazole (PRILOSEC) 40 MG capsule TAKE ONE CAPSULE EACH DAY  . OVER THE COUNTER MEDICATION 1 tablet daily. Tumeric  . PROCTOZONE-HC 2.5 % rectal cream APPLY RECTALLY TWICE DAILY AS NEEDED AFTER BOWEL MOVEMENT  . ranitidine (ZANTAC) 300 MG tablet TAKE ONE TABLET AT BEDTIME  . SUMAtriptan (IMITREX) 6 MG/0.5ML SOLN injection 0.665ml subQ for headache. May repeat in 2 hrs if headache persists, no more than 2 shots in 24 hrs.  . vitamin B-12 (CYANOCOBALAMIN) 1000 MCG tablet Take 1,000 mcg by mouth every other day.  . [DISCONTINUED] Drospirenone-Ethinyl Estradiol-Levomefol (SAFYRAL) 3-0.03-0.451 MG tablet Take 1 tablet by mouth daily.   No current facility-administered medications on file prior to visit.     Problem list She has Sinusitis, chronic; Allergic rhinitis; IBS; FEMALE INFERTILITY; ACNE VULGARIS; Fatigue; Malar rash; MIGRAINES, HX OF; Gestational hypertension; Headache; Epigastric abdominal pain; Insulin resistance; Obesity (BMI 30-39.9); Generalized anxiety disorder; and Insomnia on her problem list.  Review of Systems  Constitutional: Positive for fatigue. Negative for chills and diaphoresis.  HENT: Positive for congestion, sinus pressure and sore throat. Negative for ear pain and sneezing.   Respiratory: Negative.  Negative for cough and shortness of breath.   Cardiovascular: Negative.   Musculoskeletal: Negative for neck pain.  Neurological: Positive for headaches. Negative for dizziness and light-headedness.  Objective:   Physical Exam  Constitutional: She appears well-developed and well-nourished.  HENT:  Head: Normocephalic and atraumatic.  Right Ear: External ear normal.  Nose: Right sinus exhibits maxillary sinus tenderness. Right sinus exhibits no frontal sinus tenderness. Left sinus exhibits maxillary sinus tenderness. Left sinus exhibits no frontal sinus tenderness.   Mouth/Throat: Mucous membranes are normal. No trismus in the jaw. Posterior oropharyngeal erythema present. No oropharyngeal exudate, posterior oropharyngeal edema or tonsillar abscesses.  Eyes: Conjunctivae and EOM are normal.  Neck: Normal range of motion. Neck supple.  Cardiovascular: Normal rate, regular rhythm, normal heart sounds and intact distal pulses.   Pulmonary/Chest: Effort normal and breath sounds normal. No respiratory distress. She has no wheezes.  Abdominal: Soft. Bowel sounds are normal.  Lymphadenopathy:    She has no cervical adenopathy.  Skin: Skin is warm and dry.       Assessment & Plan:  1. Sore throat/URI Treat symptoms, if not better message and will send in ABX - POCT rapid strep A- negative - predniSONE (DELTASONE) 20 MG tablet; 2 tablets daily for 3 days, 1 tablet daily for 4 days.  Dispense: 10 tablet; Refill: 0 - ketorolac (TORADOL) injection 60 mg; Inject 2 mLs (60 mg total) into the muscle once.  Future Appointments Date Time Provider Department Center  06/20/2016 10:15 AM Quentin Mulling, PA-C [facility] None  12/02/2016 3:30 PM Butch Penny, NP [facility] None

## 2016-06-27 ENCOUNTER — Encounter: Payer: Self-pay | Admitting: Physician Assistant

## 2016-06-28 ENCOUNTER — Encounter: Payer: Self-pay | Admitting: Physician Assistant

## 2016-06-28 MED ORDER — LEVOFLOXACIN 500 MG PO TABS: 500.0000 mg | ORAL_TABLET | Freq: Every day | ORAL | 0 refills | Status: DC

## 2016-07-01 ENCOUNTER — Other Ambulatory Visit: Payer: Self-pay | Admitting: Physician Assistant

## 2016-07-01 ENCOUNTER — Encounter: Payer: Self-pay | Admitting: Physician Assistant

## 2016-07-01 MED ORDER — DOXEPIN HCL 3 MG PO TABS: ORAL_TABLET | ORAL | 0 refills | Status: DC

## 2016-07-02 ENCOUNTER — Other Ambulatory Visit: Payer: Self-pay | Admitting: Physician Assistant

## 2016-07-02 MED ORDER — ESZOPICLONE 3 MG PO TABS: 3.0000 mg | ORAL_TABLET | Freq: Every evening | ORAL | 0 refills | Status: DC | PRN

## 2016-07-15 ENCOUNTER — Other Ambulatory Visit: Payer: Self-pay | Admitting: Internal Medicine

## 2016-07-16 ENCOUNTER — Other Ambulatory Visit: Payer: Self-pay | Admitting: Physician Assistant

## 2016-07-16 DIAGNOSIS — F411 Generalized anxiety disorder: Secondary | ICD-10-CM

## 2016-07-16 MED ORDER — ALPRAZOLAM 1 MG PO TABS: ORAL_TABLET | ORAL | 1 refills | Status: DC

## 2016-07-19 ENCOUNTER — Encounter: Payer: Self-pay | Admitting: Physician Assistant

## 2016-07-25 ENCOUNTER — Other Ambulatory Visit: Payer: Self-pay | Admitting: Physician Assistant

## 2016-07-30 ENCOUNTER — Encounter: Payer: Self-pay | Admitting: Physician Assistant

## 2016-07-30 ENCOUNTER — Ambulatory Visit (INDEPENDENT_AMBULATORY_CARE_PROVIDER_SITE_OTHER): Payer: BLUE CROSS/BLUE SHIELD | Admitting: Physician Assistant

## 2016-07-30 VITALS — BP 118/66 | HR 86 | Temp 97.3°F | Resp 16 | Ht 63.0 in | Wt 206.0 lb

## 2016-07-30 DIAGNOSIS — F411 Generalized anxiety disorder: Secondary | ICD-10-CM | POA: Diagnosis not present

## 2016-07-30 DIAGNOSIS — J329 Chronic sinusitis, unspecified: Secondary | ICD-10-CM | POA: Diagnosis not present

## 2016-07-30 DIAGNOSIS — R51 Headache: Secondary | ICD-10-CM | POA: Diagnosis not present

## 2016-07-30 DIAGNOSIS — R519 Headache, unspecified: Secondary | ICD-10-CM

## 2016-07-30 MED ORDER — LEVOFLOXACIN 500 MG PO TABS: 500.0000 mg | ORAL_TABLET | Freq: Every day | ORAL | 0 refills | Status: DC

## 2016-07-30 MED ORDER — VENLAFAXINE HCL ER 37.5 MG PO CP24
37.5000 mg | ORAL_CAPSULE | Freq: Every day | ORAL | 2 refills | Status: DC
Start: 2016-07-30 — End: 2016-09-18

## 2016-07-30 MED ORDER — KETOROLAC TROMETHAMINE 60 MG/2ML IM SOLN
60.0000 mg | Freq: Once | INTRAMUSCULAR | Status: AC
  Administered 2016-07-30: 60 mg via INTRAMUSCULAR

## 2016-07-30 MED ORDER — VENLAFAXINE HCL ER 75 MG PO CP24: 75.0000 mg | ORAL_CAPSULE | Freq: Every day | ORAL | 2 refills | Status: DC

## 2016-07-30 MED ORDER — AMPHETAMINE-DEXTROAMPHETAMINE 10 MG PO TABS: 10.0000 mg | ORAL_TABLET | Freq: Two times a day (BID) | ORAL | 0 refills | Status: DC

## 2016-07-30 NOTE — Progress Notes (Signed)
Subjective:    Patient ID: Eileen Rios, female    DOB: Dec 15, 1985, 30 y.o.   MRN: [id number]  HPI 30 y.o. WF presents with sinus issues and HA.  Has history of headaches/migraines, has had migraine x 3 days with sinus issues, teeth pain, fever, chills, stuffy nose. She is on prozac 40mg  and states that it is not helping with much with depression or is hit or miss.   Blood pressure 118/66, pulse 86, temperature 97.3 F (36.3 C), resp. rate 16, height 5\' 3"  (1.6 m), weight 206 lb (93.4 kg), SpO2 97 %.  Medications Current Outpatient Prescriptions on File Prior to Visit  Medication Sig  . ALPRAZolam (XANAX) 1 MG tablet Take 1/2 to 1 tablet 1 to 2 x /day if needed for anxiety  . amphetamine-dextroamphetamine (ADDERALL) 10 MG tablet Take 1 tablet (10 mg total) by mouth 2 (two) times daily with a meal.  . atenolol (TENORMIN) 50 MG tablet TAKE ONE TABLET AT BEDTIME  . azelastine (OPTIVAR) 0.05 % ophthalmic solution Place 1 drop into both eyes 2 (two) times daily. (Patient taking differently: Place 1 drop into both eyes 2 (two) times daily as needed. )  . Biotin 10 MG TABS Take by mouth daily.  . cetirizine (ZYRTEC) 10 MG tablet Take 10 mg by mouth daily as needed for allergies.  . Chlorphen-PE-Acetaminophen 4-10-325 MG TABS Take 1 tablet by mouth 3 (three) times daily as needed.  . Cholecalciferol (VITAMIN D3) 10000 units capsule Take 10,000 Units by mouth daily.  Marland Kitchen. co-enzyme Q-10 30 MG capsule Take 30 mg by mouth daily.  . Eszopiclone (ESZOPICLONE) 3 MG TABS Take 1 tablet (3 mg total) by mouth at bedtime as needed.  Marland Kitchen. FLUoxetine (PROZAC) 20 MG capsule TAKE ONE CAPSULE EACH DAY  . metoCLOPramide (REGLAN) 10 MG tablet Take at onset of migraine. Can take it every 6 hours if needed.  . montelukast (SINGULAIR) 10 MG tablet Take 1 tablet (10 mg total) by mouth at bedtime.  . Multiple Vitamins-Calcium (ONE-A-DAY WOMENS FORMULA PO) Take by mouth daily.  . Norethindrone Acetate-Ethinyl Estrad-FE  (LOESTRIN 24 FE) 1-20 MG-MCG(24) tablet   . omeprazole (PRILOSEC) 40 MG capsule TAKE ONE CAPSULE EACH DAY  . OVER THE COUNTER MEDICATION 1 tablet daily. Tumeric  . PROCTOZONE-HC 2.5 % rectal cream APPLY RECTALLY TWICE DAILY AS NEEDED AFTER BOWEL MOVEMENT  . ranitidine (ZANTAC) 300 MG tablet TAKE ONE TABLET AT BEDTIME  . SUMAtriptan (IMITREX) 6 MG/0.5ML SOLN injection 0.655ml subQ for headache. May repeat in 2 hrs if headache persists, no more than 2 shots in 24 hrs.  . vitamin B-12 (CYANOCOBALAMIN) 1000 MCG tablet Take 1,000 mcg by mouth every other day.  . [DISCONTINUED] Drospirenone-Ethinyl Estradiol-Levomefol (SAFYRAL) 3-0.03-0.451 MG tablet Take 1 tablet by mouth daily.   No current facility-administered medications on file prior to visit.     Problem list She has Sinusitis, chronic; Allergic rhinitis; IBS; FEMALE INFERTILITY; ACNE VULGARIS; Fatigue; Malar rash; MIGRAINES, HX OF; Gestational hypertension; Headache; Epigastric abdominal pain; Insulin resistance; Obesity (BMI 30-39.9); Generalized anxiety disorder; and Insomnia on her problem list.  Review of Systems  Constitutional: Negative for chills and diaphoresis.  HENT: Positive for congestion, postnasal drip, sinus pressure and sneezing. Negative for ear pain and sore throat.   Respiratory: Negative for cough, chest tightness, shortness of breath and wheezing.   Cardiovascular: Negative.   Gastrointestinal: Negative.   Genitourinary: Negative.   Musculoskeletal: Negative for neck pain.  Neurological: Positive for headaches.  Objective:   Physical Exam  Constitutional: She appears well-developed and well-nourished.  HENT:  Head: Normocephalic and atraumatic.  Right Ear: External ear normal.  Nose: Right sinus exhibits maxillary sinus tenderness. Right sinus exhibits no frontal sinus tenderness. Left sinus exhibits maxillary sinus tenderness. Left sinus exhibits no frontal sinus tenderness.  Eyes: Conjunctivae and EOM are  normal.  Neck: Normal range of motion. Neck supple.  Cardiovascular: Normal rate, regular rhythm, normal heart sounds and intact distal pulses.   Pulmonary/Chest: Effort normal and breath sounds normal. No respiratory distress. She has no wheezes.  Abdominal: Soft. Bowel sounds are normal.  Lymphadenopathy:    She has no cervical adenopathy.  Skin: Skin is warm and dry.       Assessment & Plan:  1. Chronic sinusitis, unspecified location - ketorolac (TORADOL) injection 60 mg; Inject 2 mLs (60 mg total) into the muscle once.  2. Nonintractable episodic headache, unspecified headache type - venlafaxine XR (EFFEXOR XR) 37.5 MG 24 hr capsule; Take 1 capsule (37.5 mg total) by mouth daily.  Dispense: 30 capsule; Refill: 2 - venlafaxine XR (EFFEXOR XR) 75 MG 24 hr capsule; Take 1 capsule (75 mg total) by mouth daily.  Dispense: 30 capsule; Refill: 2 - ketorolac (TORADOL) injection 60 mg; Inject 2 mLs (60 mg total) into the muscle once.  3. Generalized anxiety disorder - venlafaxine XR (EFFEXOR XR) 37.5 MG 24 hr capsule; Take 1 capsule (37.5 mg total) by mouth daily.  Dispense: 30 capsule; Refill: 2 - venlafaxine XR (EFFEXOR XR) 75 MG 24 hr capsule; Take 1 capsule (75 mg total) by mouth daily.  Dispense: 30 capsule; Refill: 2  Future Appointments Date Time Provider Department Center  09/18/2016 10:30 AM Quentin Mulling, PA-C [facility] None  12/02/2016 3:30 PM Butch Penny, NP [facility] None

## 2016-07-30 NOTE — Addendum Note (Signed)
Addended by: Quentin MullingOLLIER, Ivana Nicastro R on: 07/30/2016 12:00 PM   Modules accepted: Orders

## 2016-07-30 NOTE — Patient Instructions (Addendum)
Take only 20mg  of the prozac Add on effexor 37.5 mg daily with this Stay on this for at least 2 weeks  We can increase the effexor to 75mg  or 2 pills for 2 weeks While doing this you can 1) stay on the prozac or 2) stop the prozac.   We can increase the effexor to 150mg  a day or stay on the 75mg  WITH OR WITHOUT THE PROZAC

## 2016-08-14 ENCOUNTER — Other Ambulatory Visit: Payer: Self-pay | Admitting: Physician Assistant

## 2016-08-27 ENCOUNTER — Other Ambulatory Visit: Payer: Self-pay | Admitting: *Deleted

## 2016-08-27 MED ORDER — ESZOPICLONE 3 MG PO TABS: 3.0000 mg | ORAL_TABLET | Freq: Every evening | ORAL | 0 refills | Status: DC | PRN

## 2016-09-09 ENCOUNTER — Encounter: Payer: Self-pay | Admitting: Physician Assistant

## 2016-09-10 MED ORDER — PREDNISONE 20 MG PO TABS: ORAL_TABLET | ORAL | 1 refills | Status: DC

## 2016-09-11 ENCOUNTER — Other Ambulatory Visit: Payer: Self-pay

## 2016-09-11 MED ORDER — MONTELUKAST SODIUM 10 MG PO TABS: 10.0000 mg | ORAL_TABLET | Freq: Every day | ORAL | 5 refills | Status: DC

## 2016-09-18 ENCOUNTER — Encounter: Payer: Self-pay | Admitting: Physician Assistant

## 2016-09-18 ENCOUNTER — Ambulatory Visit (INDEPENDENT_AMBULATORY_CARE_PROVIDER_SITE_OTHER): Payer: BLUE CROSS/BLUE SHIELD | Admitting: Physician Assistant

## 2016-09-18 VITALS — BP 118/76 | HR 81 | Temp 97.1°F | Resp 16 | Ht 63.0 in | Wt 210.0 lb

## 2016-09-18 DIAGNOSIS — Z79899 Other long term (current) drug therapy: Secondary | ICD-10-CM

## 2016-09-18 DIAGNOSIS — D649 Anemia, unspecified: Secondary | ICD-10-CM

## 2016-09-18 DIAGNOSIS — E785 Hyperlipidemia, unspecified: Secondary | ICD-10-CM

## 2016-09-18 DIAGNOSIS — E669 Obesity, unspecified: Secondary | ICD-10-CM | POA: Diagnosis not present

## 2016-09-18 DIAGNOSIS — E559 Vitamin D deficiency, unspecified: Secondary | ICD-10-CM | POA: Diagnosis not present

## 2016-09-18 LAB — FERRITIN: Ferritin: 62 ng/mL (ref 10–154)

## 2016-09-18 LAB — IRON AND TIBC
%SAT: 23 % (ref 11–50)
Iron: 93 ug/dL (ref 40–190)
TIBC: 400 ug/dL (ref 250–450)
UIBC: 307 ug/dL (ref 125–400)

## 2016-09-18 LAB — TSH: TSH: 1.59 m[IU]/L

## 2016-09-18 LAB — VITAMIN B12: VITAMIN B 12: 409 pg/mL (ref 200–1100)

## 2016-09-18 MED ORDER — LEVOFLOXACIN 500 MG PO TABS: 500.0000 mg | ORAL_TABLET | Freq: Every day | ORAL | 0 refills | Status: DC

## 2016-09-18 MED ORDER — CIPROFLOXACIN HCL 500 MG PO TABS: 500.0000 mg | ORAL_TABLET | Freq: Two times a day (BID) | ORAL | 0 refills | Status: DC

## 2016-09-18 MED ORDER — AMPHETAMINE-DEXTROAMPHETAMINE 10 MG PO TABS: 10.0000 mg | ORAL_TABLET | Freq: Two times a day (BID) | ORAL | 0 refills | Status: DC

## 2016-09-18 MED ORDER — PREDNISONE 20 MG PO TABS: ORAL_TABLET | ORAL | 0 refills | Status: DC

## 2016-09-18 MED ORDER — CHLORPHEN-PE-ACETAMINOPHEN 4-10-325 MG PO TABS: 1.0000 | ORAL_TABLET | Freq: Three times a day (TID) | ORAL | 0 refills | Status: DC | PRN

## 2016-09-18 MED ORDER — HYOSCYAMINE SULFATE SL 0.125 MG SL SUBL: 0.1250 mg | SUBLINGUAL_TABLET | SUBLINGUAL | 3 refills | Status: DC | PRN

## 2016-09-18 NOTE — Progress Notes (Signed)
Assessment and Plan:  Hypertension: Continue medication, monitor blood pressure at home. Continue DASH diet.  Reminder to go to the ER if any CP, SOB, nausea, dizziness, severe HA, changes vision/speech, left arm numbness and tingling, and jaw pain. Cholesterol: Continue diet and exercise. Check cholesterol.  Pre-diabetes-Continue diet and exercise. Check A1C Vitamin D Def- check level and continue medications.  Fatigue- better with adderall, continue sleep meds Anxiety- continue effexor  Continue diet and meds as discussed. Further disposition pending results of labs.  HPI 30 y.o. female  presents for 3 month follow up with hypertension, hyperlipidemia, prediabetes and vitamin D.  She is going to [place] from Dec 18th to 29th with her husband and family, would like refills on medication and would like some meds incase she has diarrhea, etc.     Her blood pressure has been controlled at home, today their BP is BP: 118/76  She does workoutShe denies chest pain, shortness of breath, dizziness.  She is not on cholesterol medication and denies myalgias. Her cholesterol is at goal. The cholesterol last visit was:  Lab Results  Component Value Date   CHOL 195 07/16/2015   HDL 48 07/16/2015   LDLCALC 104 07/16/2015   TRIG 217 (H) 07/16/2015   CHOLHDL 4.1 07/16/2015  Last A1C in the office was:  Lab Results  Component Value Date   HGBA1C 5.5 07/16/2015  Patient is on Vitamin D supplement.   Lab Results  Component Value Date   VD25OH 6871 07/16/2015   She has repeated sinus infections, follows with ENT.  She has had a negative sleep study other than she was not hitting REM sleep, she was tried on nuvigil but could not tolerate it, and has been on adderall that has helped. She is on adderall of this, on effexor for anxiety/HA that is helping, she has not had migraine for 2 months, has tension HA with new job but doing very well.  BMI is Body mass index is 37.2 kg/m., she is working on diet and  exercise. Wt Readings from Last 3 Encounters:  09/18/16 210 lb (95.3 kg)  07/30/16 206 lb (93.4 kg)  06/20/16 208 lb 12.8 oz (94.7 kg)    Current Medications:  Current Outpatient Prescriptions on File Prior to Visit  Medication Sig Dispense Refill  . ALPRAZolam (XANAX) 1 MG tablet Take 1/2 to 1 tablet 1 to 2 x /day if needed for anxiety 60 tablet 1  . amphetamine-dextroamphetamine (ADDERALL) 10 MG tablet Take 1 tablet (10 mg total) by mouth 2 (two) times daily with a meal. 60 tablet 0  . atenolol (TENORMIN) 50 MG tablet TAKE ONE TABLET AT BEDTIME 90 tablet 1  . azelastine (OPTIVAR) 0.05 % ophthalmic solution Place 1 drop into both eyes 2 (two) times daily. (Patient taking differently: Place 1 drop into both eyes 2 (two) times daily as needed. ) 6 mL 5  . Biotin 10 MG TABS Take by mouth daily.    . cetirizine (ZYRTEC) 10 MG tablet Take 10 mg by mouth daily as needed for allergies.    . Chlorphen-PE-Acetaminophen 4-10-325 MG TABS Take 1 tablet by mouth 3 (three) times daily as needed. 30 tablet 0  . Cholecalciferol (VITAMIN D3) 10000 units capsule Take 10,000 Units by mouth daily.    Marland Kitchen. co-enzyme Q-10 30 MG capsule Take 30 mg by mouth daily.    . Eszopiclone (ESZOPICLONE) 3 MG TABS Take 1 tablet (3 mg total) by mouth at bedtime as needed. 30 tablet 0  .  FLUoxetine (PROZAC) 20 MG capsule TAKE ONE CAPSULE EACH DAY 60 capsule 2  . metoCLOPramide (REGLAN) 10 MG tablet Take at onset of migraine. Can take it every 6 hours if needed. 10 tablet 12  . montelukast (SINGULAIR) 10 MG tablet Take 1 tablet (10 mg total) by mouth at bedtime. 30 tablet 5  . Multiple Vitamins-Calcium (ONE-A-DAY WOMENS FORMULA PO) Take by mouth daily.    . Norethindrone Acetate-Ethinyl Estrad-FE (LOESTRIN 24 FE) 1-20 MG-MCG(24) tablet   5  . omeprazole (PRILOSEC) 40 MG capsule TAKE ONE CAPSULE EACH DAY 90 capsule 1  . OVER THE COUNTER MEDICATION 1 tablet daily. Tumeric    . PROCTOZONE-HC 2.5 % rectal cream APPLY RECTALLY TWICE  DAILY AS NEEDED AFTER BOWEL MOVEMENT 30 g 3  . ranitidine (ZANTAC) 300 MG tablet TAKE ONE TABLET AT BEDTIME 30 tablet 2  . SUMAtriptan (IMITREX) 6 MG/0.5ML SOLN injection 0.50ml subQ for headache. May repeat in 2 hrs if headache persists, no more than 2 shots in 24 hrs. 8 vial 2  . venlafaxine XR (EFFEXOR XR) 75 MG 24 hr capsule Take 1 capsule (75 mg total) by mouth daily. 30 capsule 2  . vitamin B-12 (CYANOCOBALAMIN) 1000 MCG tablet Take 1,000 mcg by mouth every other day.    . [DISCONTINUED] Drospirenone-Ethinyl Estradiol-Levomefol (SAFYRAL) 3-0.03-0.451 MG tablet Take 1 tablet by mouth daily. 1 Package 3   No current facility-administered medications on file prior to visit.    Medical History:  Past Medical History:  Diagnosis Date  . Anxiety   . Depression   . Headache(784.0)    migraines  . Hypertension   . IBS (irritable bowel syndrome)   . Insulin resistance   . Seasonal allergies   . Urticaria    Allergies: No Known Allergies   Review of Systems:  Review of Systems  Constitutional: Positive for malaise/fatigue. Negative for chills, diaphoresis, fever and weight loss.  HENT: Positive for congestion. Negative for ear discharge, ear pain, hearing loss, nosebleeds, sore throat and tinnitus.   Eyes: Negative.  Negative for blurred vision and double vision.  Respiratory: Negative.  Negative for stridor.   Cardiovascular: Negative.   Gastrointestinal: Positive for heartburn. Negative for abdominal pain, blood in stool, constipation, diarrhea, melena, nausea and vomiting.  Genitourinary: Negative.   Musculoskeletal: Positive for joint pain and myalgias. Negative for back pain, falls and neck pain.  Skin: Negative.  Negative for rash.  Neurological: Positive for headaches. Negative for dizziness, tingling, tremors, sensory change, speech change, focal weakness, seizures, loss of consciousness and weakness.  Psychiatric/Behavioral: Negative for depression, hallucinations, memory  loss, substance abuse and suicidal ideas. The patient is nervous/anxious and has insomnia.     Family history- Review and unchanged Social history- Review and unchanged Physical Exam: BP 118/76   Pulse 81   Temp 97.1 F (36.2 C)   Resp 16   Ht 5\' 3"  (1.6 m)   Wt 210 lb (95.3 kg)   SpO2 98%   BMI 37.20 kg/m  Wt Readings from Last 3 Encounters:  09/18/16 210 lb (95.3 kg)  07/30/16 206 lb (93.4 kg)  06/20/16 208 lb 12.8 oz (94.7 kg)   General Appearance: Well nourished, in no apparent distress. Eyes: PERRLA, EOMs, conjunctiva no swelling or erythema Sinuses: No Frontal/maxillary tenderness ENT/Mouth: Ext aud canals clear, TMs without erythema, bulging. No erythema, swelling, or exudate on post pharynx.  Tonsils not swollen or erythematous. Hearing normal.  Neck: Supple, thyroid normal.  Respiratory: Respiratory effort normal, BS equal bilaterally  without rales, rhonchi, wheezing or stridor.  Cardio: RRR with no MRGs. Brisk peripheral pulses without edema.  Abdomen: Soft, + BS,  Non tender, no guarding, rebound, hernias, masses. Lymphatics: Non tender without lymphadenopathy.  Musculoskeletal: Full ROM, 5/5 strength, Normal gait Skin: Warm, dry without rashes, lesions, ecchymosis.  Neuro: Cranial nerves intact. Normal muscle tone, no cerebellar symptoms. Psych: Awake and oriented X 3, normal affect, Insight and Judgment appropriate.    Quentin MullingAmanda Vivia Rosenburg, PA-C 10:58 AM [facility]

## 2016-09-18 NOTE — Patient Instructions (Signed)

## 2016-09-19 LAB — VITAMIN D 25 HYDROXY (VIT D DEFICIENCY, FRACTURES): Vit D, 25-Hydroxy: 40 ng/mL (ref 30–100)

## 2016-09-22 ENCOUNTER — Other Ambulatory Visit: Payer: Self-pay | Admitting: Internal Medicine

## 2016-09-22 NOTE — Telephone Encounter (Signed)
Eszopiclone was called into [facility]

## 2016-10-13 ENCOUNTER — Ambulatory Visit (INDEPENDENT_AMBULATORY_CARE_PROVIDER_SITE_OTHER): Payer: BLUE CROSS/BLUE SHIELD | Admitting: Internal Medicine

## 2016-10-13 ENCOUNTER — Encounter: Payer: Self-pay | Admitting: Internal Medicine

## 2016-10-13 VITALS — BP 106/68 | HR 132 | Temp 100.0°F | Resp 16

## 2016-10-13 DIAGNOSIS — J101 Influenza due to other identified influenza virus with other respiratory manifestations: Secondary | ICD-10-CM

## 2016-10-13 DIAGNOSIS — R509 Fever, unspecified: Secondary | ICD-10-CM

## 2016-10-13 LAB — POCT INFLUENZA A/B
Influenza A, POC: POSITIVE — AB
Influenza B, POC: NEGATIVE

## 2016-10-13 MED ORDER — KETOROLAC TROMETHAMINE 30 MG/ML IJ SOLN
30.0000 mg | Freq: Once | INTRAMUSCULAR | Status: AC
  Administered 2016-10-13: 30 mg via INTRAMUSCULAR

## 2016-10-13 MED ORDER — ALBUTEROL SULFATE HFA 108 (90 BASE) MCG/ACT IN AERS: 2.0000 | INHALATION_SPRAY | RESPIRATORY_TRACT | 0 refills | Status: DC | PRN

## 2016-10-13 MED ORDER — AZITHROMYCIN 250 MG PO TABS: ORAL_TABLET | ORAL | 0 refills | Status: DC

## 2016-10-13 MED ORDER — KETOROLAC TROMETHAMINE 60 MG/2ML IM SOLN: 60.0000 mg | Freq: Once | INTRAMUSCULAR | Status: DC

## 2016-10-13 MED ORDER — PROMETHAZINE-CODEINE 6.25-10 MG/5ML PO SYRP: 5.0000 mL | ORAL_SOLUTION | Freq: Four times a day (QID) | ORAL | 0 refills | Status: DC | PRN

## 2016-10-13 MED ORDER — PREDNISONE 20 MG PO TABS: ORAL_TABLET | ORAL | 0 refills | Status: DC

## 2016-10-13 NOTE — Progress Notes (Signed)
Patient ID: Eileen Rios, female   DOB: Oct 01, 1986, 31 y.o.   MRN: [id number]  HPI  Patient presents to the office for evaluation of cough.  It has been going on for 1 days.  Patient reports all the time, wet, cough with post tussive coughing..  They also endorse change in voice, chills, fever, postnasal drip, shortness of breath and headaches, body aches, ears ringing.  .  They have tried tylenol and ibuprofen, last dose this morning, sudafed at 6 am.  She did not take her regular prescription.  They report that nothing has worked.  They admits to other sick contacts.  Her son had a cold but he has improved.  She had two coworkers diagnosed with flu last week.    Review of Systems  Constitutional: Positive for malaise/fatigue. Negative for chills and fever.  HENT: Positive for congestion, ear pain, hearing loss and sore throat.   Respiratory: Positive for cough. Negative for sputum production, shortness of breath and wheezing.   Cardiovascular: Negative for chest pain, palpitations and leg swelling.  Gastrointestinal: Positive for nausea.  Musculoskeletal: Positive for myalgias.  Neurological: Positive for headaches.    PE:  Vitals:   10/13/16 1522  BP: 106/68  Pulse: (!) 132  Resp: 16  Temp: 100 F (37.8 C)    General:  Alert and non-toxic, WDWN, NAD HEENT: NCAT, PERLA, EOM normal, no occular discharge or erythema.  Nasal mucosal edema with sinus tenderness to palpation.  Oropharynx clear with minimal oropharyngeal edema and erythema.  Mucous membranes moist and pink. Neck:  Cervical adenopathy Chest:  RRR no MRGs.  Lungs clear to auscultation A&P with no wheezes rhonchi or rales.   Abdomen: +BS x 4 quadrants, soft, non-tender, no guarding, rigidity, or rebound. Skin: warm and dry no rash Neuro: A&Ox4, CN II-XII grossly intact  Assessment and Plan:   1. Fever, unspecified fever cause  - POCT Influenza A/B - ketorolac (TORADOL) 30 MG/ML injection 30 mg; Inject 1 mL (30 mg  total) into the muscle once.  2. Influenza A  - promethazine-codeine (PHENERGAN WITH CODEINE) 6.25-10 MG/5ML syrup; Take 5 mLs by mouth every 6 (six) hours as needed for cough.  Dispense: 180 mL; Refill: 0 - predniSONE (DELTASONE) 20 MG tablet; 3 tabs po daily x 3 days, then 2 tabs x 3 days, then 1.5 tabs x 3 days, then 1 tab x 3 days, then 0.5 tabs x 3 days  Dispense: 27 tablet; Refill: 0 - albuterol (PROVENTIL HFA;VENTOLIN HFA) 108 (90 Base) MCG/ACT inhaler; Inhale 2 puffs into the lungs every 2 (two) hours as needed for wheezing or shortness of breath (cough).  Dispense: 1 Inhaler; Refill: 0 - azithromycin (ZITHROMAX Z-PAK) 250 MG tablet; 2 po day one, then 1 daily x 4 days  Dispense: 6 tablet; Refill: 0 -increase fluid intake -alternate tylenol and ibuprofen every 6-8 hours as needed for fever control -given colored sputum production will also cover for possible superimposed bacterial infection -patient to return to the office if worsening symptoms -patient aware that the course of the illness will likely be 7-10 days.

## 2016-10-16 ENCOUNTER — Encounter: Payer: Self-pay | Admitting: Internal Medicine

## 2016-10-18 ENCOUNTER — Other Ambulatory Visit: Payer: Self-pay | Admitting: Physician Assistant

## 2016-10-18 DIAGNOSIS — F411 Generalized anxiety disorder: Secondary | ICD-10-CM

## 2016-10-20 NOTE — Telephone Encounter (Signed)
Xanax was called into pharmacy @ 8:32am

## 2016-10-28 ENCOUNTER — Encounter: Payer: Self-pay | Admitting: Internal Medicine

## 2016-11-05 ENCOUNTER — Encounter: Payer: Self-pay | Admitting: Physician Assistant

## 2016-11-05 ENCOUNTER — Ambulatory Visit (INDEPENDENT_AMBULATORY_CARE_PROVIDER_SITE_OTHER): Payer: BLUE CROSS/BLUE SHIELD | Admitting: Physician Assistant

## 2016-11-05 VITALS — BP 110/88 | HR 106 | Temp 97.3°F | Resp 16 | Ht 63.0 in | Wt 215.4 lb

## 2016-11-05 DIAGNOSIS — Z0001 Encounter for general adult medical examination with abnormal findings: Secondary | ICD-10-CM

## 2016-11-05 DIAGNOSIS — R6889 Other general symptoms and signs: Secondary | ICD-10-CM

## 2016-11-05 DIAGNOSIS — E669 Obesity, unspecified: Secondary | ICD-10-CM

## 2016-11-05 DIAGNOSIS — F411 Generalized anxiety disorder: Secondary | ICD-10-CM

## 2016-11-05 DIAGNOSIS — Z79899 Other long term (current) drug therapy: Secondary | ICD-10-CM

## 2016-11-05 DIAGNOSIS — G43809 Other migraine, not intractable, without status migrainosus: Secondary | ICD-10-CM | POA: Diagnosis not present

## 2016-11-05 DIAGNOSIS — G4700 Insomnia, unspecified: Secondary | ICD-10-CM | POA: Diagnosis not present

## 2016-11-05 DIAGNOSIS — E559 Vitamin D deficiency, unspecified: Secondary | ICD-10-CM | POA: Diagnosis not present

## 2016-11-05 DIAGNOSIS — R5383 Other fatigue: Secondary | ICD-10-CM

## 2016-11-05 DIAGNOSIS — O132 Gestational [pregnancy-induced] hypertension without significant proteinuria, second trimester: Secondary | ICD-10-CM

## 2016-11-05 DIAGNOSIS — E785 Hyperlipidemia, unspecified: Secondary | ICD-10-CM

## 2016-11-05 LAB — CBC WITH DIFFERENTIAL/PLATELET
BASOS ABS: 83 {cells}/uL (ref 0–200)
Basophils Relative: 1 %
EOS ABS: 249 {cells}/uL (ref 15–500)
Eosinophils Relative: 3 %
HCT: 40.3 % (ref 35.0–45.0)
HEMOGLOBIN: 13.7 g/dL (ref 11.7–15.5)
LYMPHS ABS: 2822 {cells}/uL (ref 850–3900)
LYMPHS PCT: 34 %
MCH: 30 pg (ref 27.0–33.0)
MCHC: 34 g/dL (ref 32.0–36.0)
MCV: 88.4 fL (ref 80.0–100.0)
MONO ABS: 747 {cells}/uL (ref 200–950)
MPV: 9.2 fL (ref 7.5–12.5)
Monocytes Relative: 9 %
NEUTROS PCT: 53 %
Neutro Abs: 4399 cells/uL (ref 1500–7800)
Platelets: 295 10*3/uL (ref 140–400)
RBC: 4.56 MIL/uL (ref 3.80–5.10)
RDW: 14 % (ref 11.0–15.0)
WBC: 8.3 10*3/uL (ref 3.8–10.8)

## 2016-11-05 LAB — URINALYSIS, ROUTINE W REFLEX MICROSCOPIC
Bilirubin Urine: NEGATIVE
GLUCOSE, UA: NEGATIVE
Ketones, ur: NEGATIVE
Nitrite: NEGATIVE
PH: 5.5 (ref 5.0–8.0)
Protein, ur: NEGATIVE
SPECIFIC GRAVITY, URINE: 1.017 (ref 1.001–1.035)

## 2016-11-05 LAB — HEMOGLOBIN A1C
Hgb A1c MFr Bld: 5 % (ref <5.7)
Mean Plasma Glucose: 97 mg/dL

## 2016-11-05 LAB — TSH: TSH: 1.07 mIU/L

## 2016-11-05 MED ORDER — PHENTERMINE HCL 37.5 MG PO TABS
37.5000 mg | ORAL_TABLET | Freq: Every day | ORAL | 2 refills | Status: DC
Start: 2016-11-05 — End: 2016-12-22

## 2016-11-05 NOTE — Patient Instructions (Signed)
Phentermine  While taking the medication we may ask that you come into the office once a month or once every 2-3 months to monitor your weight, blood pressure, and heart rate. In addition we can help answer your questions about diet, exercise, and help you every step of the way with your weight loss journey. Sometime it is helpful if you bring in a food diary or use an app on your phone such as myfitnesspal to record your calorie intake, especially in the beginning.   You can start out on 1/3 to 1/2 a pill in the morning and if you are tolerating it well you can increase to one pill daily. I also have some patients that take 1/3 or 1/2 at lunch to help prevent night time eating.  This medication is cheapest CASH pay at [facility] is 14-17 dollars and you do NOT need a membership to get meds from there.    What is this medicine? PHENTERMINE (FEN ter meen) decreases your appetite. This medicine is intended to be used in addition to a healthy reduced calorie diet and exercise. The best results are achieved this way. This medicine is only indicated for short-term use. Eventually your weight loss may level out and the medication will no longer be needed.   How should I use this medicine? Take this medicine by mouth. Follow the directions on the prescription label. The tablets should stay in the bottle until immediately before you take your dose. Take your doses at regular intervals. Do not take your medicine more often than directed.  Overdosage: If you think you have taken too much of this medicine contact a poison control center or emergency room at once. NOTE: This medicine is only for you. Do not share this medicine with others.  What if I miss a dose? If you miss a dose, take it as soon as you can. If it is almost time for your next dose, take only that dose. Do not take double or extra doses. Do not increase or in any way change your dose without consulting your  doctor.  What should I watch for while using this medicine? Notify your physician immediately if you become short of breath while doing your normal activities. Do not take this medicine within 6 hours of bedtime. It can keep you from getting to sleep. Avoid drinks that contain caffeine and try to stick to a regular bedtime every night. Do not stand or sit up quickly, especially if you are an older patient. This reduces the risk of dizzy or fainting spells. Avoid alcoholic drinks.  What side effects may I notice from receiving this medicine? Side effects that you should report to your doctor or health care professional as soon as possible: -chest pain, palpitations -depression or severe changes in mood -increased blood pressure -irritability -nervousness or restlessness -severe dizziness -shortness of breath -problems urinating -unusual swelling of the legs -vomiting  Side effects that usually do not require medical attention (report to your doctor or health care professional if they continue or are bothersome): -blurred vision or other eye problems -changes in sexual ability or desire -constipation or diarrhea -difficulty sleeping -dry mouth or unpleasant taste -headache -nausea This list may not describe all possible side effects. Call your doctor for medical advice about side effects. You may report side effects to FDA at 1-800-FDA-1088.   Simple math prevails.    1st - exercise does not produce significant weight loss - at best one  converts fat into muscle , "bulks up", loses inches, but usually stays "weight neutral"     2nd - think of your body weightas a check book: If you eat more calories than you burn up - you save money or gain weight .... Or if you spend more money than you put in the check book, ie burn up more calories than you eat, then you lose weight     3rd - if you walk or run 1 mile, you burn up 100 calories - you have to burn up 3,500 calories to lose 1 pound,  ie you have to walk/run 35 miles to lose 1 measly pound. So if you want to lose 10 #, then you have to walk/run 350 miles, so.... clearly exercise is not the solution.     4. So if you consume 1,500 calories, then you have to burn up the equivalent of 15 miles to stay weight neutral - It also stands to reason that if you consume 1,500 cal/day and don't lose weight, then you must be burning up about 1,500 cals/day to stay weight neutral.     5. If you really want to lose weight, you must cut your calorie intake 300 calories /day and at that rate you should lose about 1 # every 3 days.   6. Please purchase Dr Joel Fuhrman's book(s) "The End of Dieting" & "Eat to Live" . It has some great concepts and recipes.      

## 2016-11-05 NOTE — Progress Notes (Signed)
Complete Physical  Assessment and Plan:   Pregnancy-induced hypertension in second trimester Continue atenolol for now, patient is interested in pursuing another child in August Discussed need weight loss and to switch or discontinue many of her medication before that time -     Urinalysis, Routine w reflex microscopic -     Microalbumin / creatinine urine ratio  Obesity (BMI 30-39.9) Continue atenolol for now, patient is interested in pursuing another child in August Discussed need weight loss and to switch or discontinue many of her medication before that time -     Hemoglobin A1c -     phentermine (ADIPEX-P) 37.5 MG tablet; Take 1 tablet (37.5 mg total) by mouth daily before breakfast.  Other migraine without status migrainosus, not intractable Continue effexor for now  Insomnia, unspecified type Continue lunestra for now  Other fatigue -     TSH -     Iron and TIBC -     Ferritin -     Vitamin B12  Generalized anxiety disorder Continue effexor for now, may stop and switch to prozac when she is read to get off BCP  Vitamin D deficiency -     VITAMIN D 25 Hydroxy (Vit-D Deficiency, Fractures)  Medication management -     CBC with Differential/Platelet -     BASIC METABOLIC PANEL WITH GFR -     Hepatic function panel -     Magnesium  Hyperlipidemia, unspecified hyperlipidemia type -     Lipid panel  Encounter for general adult medical examination with abnormal findings   . Discussed med's effects and SE's. Screening labs and tests as requested with regular follow-up as recommended. Over 40 minutes of exam, counseling, chart review and critical decision making was performed  HPI  This very nice 31 y.o.female presents for complete physical.  Patient has no major health issues.  Patient reports no complaints at this time.  She does not workout.  Finally, patient has history of Vitamin D Deficiency and last vitamin D was  Lab Results  Component Value Date    VD25OH 40 09/18/2016  .Currently on supplementation  She has repeated sinus infections, follows with ENT.  She has had a negative sleep study other than she was not hitting REM sleep, she was tried on nuvigil but could not tolerate it, and has been on adderall that has helped. She is on adderall of this, on effexor for anxiety/HA that is helping, she has not had migraine for 2 months, has tension HA with new job but doing very well.  Andres Ege will be 4 in August and she may want to try again for another baby, we will have to take her off many of her medicaitons BMI is Body mass index is 38.16 kg/m., she is working on diet and exercise. Wt Readings from Last 3 Encounters:  11/05/16 215 lb 6.4 oz (97.7 kg)  09/18/16 210 lb (95.3 kg)  07/30/16 206 lb (93.4 kg)     Current Medications:  Current Outpatient Prescriptions on File Prior to Visit  Medication Sig Dispense Refill  . albuterol (PROVENTIL HFA;VENTOLIN HFA) 108 (90 Base) MCG/ACT inhaler Inhale 2 puffs into the lungs every 2 (two) hours as needed for wheezing or shortness of breath (cough). 1 Inhaler 0  . ALPRAZolam (XANAX) 1 MG tablet TAKE 1/2 TO 1 TABLET TWICE DAILY AS NEEDED FOR ANXIETY 60 tablet 1  . amphetamine-dextroamphetamine (ADDERALL) 10 MG tablet Take 1 tablet (10 mg total) by mouth 2 (two) times daily  with a meal. 60 tablet 0  . atenolol (TENORMIN) 50 MG tablet TAKE ONE TABLET AT BEDTIME 90 tablet 1  . azelastine (OPTIVAR) 0.05 % ophthalmic solution Place 1 drop into both eyes 2 (two) times daily. (Patient taking differently: Place 1 drop into both eyes 2 (two) times daily as needed. ) 6 mL 5  . azithromycin (ZITHROMAX Z-PAK) 250 MG tablet 2 po day one, then 1 daily x 4 days 6 tablet 0  . Biotin 10 MG TABS Take by mouth daily.    . cetirizine (ZYRTEC) 10 MG tablet Take 10 mg by mouth daily as needed for allergies.    . Chlorphen-PE-Acetaminophen 4-10-325 MG TABS Take 1 tablet by mouth 3 (three) times daily as needed. 30 tablet 0   . Cholecalciferol (VITAMIN D3) 10000 units capsule Take 10,000 Units by mouth daily.    Marland Kitchen. co-enzyme Q-10 30 MG capsule Take 30 mg by mouth daily.    . Eszopiclone 3 MG TABS TAKE ONE TABLET AT BEDTIME AS NEEDED 30 tablet 3  . Hyoscyamine Sulfate SL (LEVSIN/SL) 0.125 MG SUBL Place 0.125 mg under the tongue every 4 (four) hours as needed. 30 each 3  . metoCLOPramide (REGLAN) 10 MG tablet Take at onset of migraine. Can take it every 6 hours if needed. 10 tablet 12  . montelukast (SINGULAIR) 10 MG tablet Take 1 tablet (10 mg total) by mouth at bedtime. 30 tablet 5  . Norethindrone Acetate-Ethinyl Estrad-FE (LOESTRIN 24 FE) 1-20 MG-MCG(24) tablet   5  . omeprazole (PRILOSEC) 40 MG capsule TAKE ONE CAPSULE EACH DAY 90 capsule 1  . predniSONE (DELTASONE) 20 MG tablet 3 tabs po daily x 3 days, then 2 tabs x 3 days, then 1.5 tabs x 3 days, then 1 tab x 3 days, then 0.5 tabs x 3 days 27 tablet 0  . PROCTOZONE-HC 2.5 % rectal cream APPLY RECTALLY TWICE DAILY AS NEEDED AFTER BOWEL MOVEMENT 30 g 3  . promethazine-codeine (PHENERGAN WITH CODEINE) 6.25-10 MG/5ML syrup Take 5 mLs by mouth every 6 (six) hours as needed for cough. 180 mL 0  . ranitidine (ZANTAC) 300 MG tablet TAKE ONE TABLET AT BEDTIME 30 tablet 2  . SUMAtriptan (IMITREX) 6 MG/0.5ML SOLN injection 0.285ml subQ for headache. May repeat in 2 hrs if headache persists, no more than 2 shots in 24 hrs. 8 vial 2  . venlafaxine XR (EFFEXOR XR) 75 MG 24 hr capsule Take 1 capsule (75 mg total) by mouth daily. 30 capsule 2  . vitamin B-12 (CYANOCOBALAMIN) 1000 MCG tablet Take 1,000 mcg by mouth every other day.    . [DISCONTINUED] Drospirenone-Ethinyl Estradiol-Levomefol (SAFYRAL) 3-0.03-0.451 MG tablet Take 1 tablet by mouth daily. 1 Package 3   No current facility-administered medications on file prior to visit.    Health Maintenance:   Immunization History  Administered Date(s) Administered  . H1N1 10/24/2008  . Influenza Whole 10/24/2008, 10/11/2010   . Influenza, Seasonal, Injecte, Preservative Fre 09/19/2015  . Td 11/13/2005  . Tdap 04/28/2013   TD/TDAP: 2014 Influenza:2016 Pneumovax:  Prevnar 13:  HPV vaccines:   LMP: No LMP recorded. Patient is not currently having periods (Reason: Oral contraceptives). Sexually Active: yes STD testing offered Pap: at [facility] MGM: : N/A  Allergies: No Known Allergies Medical History:  has Sinusitis, chronic; Allergic rhinitis; IBS; FEMALE INFERTILITY; ACNE VULGARIS; Fatigue; Malar rash; MIGRAINES, HX OF; Gestational hypertension; Headache; Epigastric abdominal pain; Insulin resistance; Obesity (BMI 30-39.9); Generalized anxiety disorder; and Insomnia on her problem list. Surgical History:  She  has a past surgical history that includes Wisdom tooth extraction and Cesarean section (N/A, 06/05/2013). Family History:  Her family history includes Diabetes in her mother; Hypertension in her father. Social History:   reports that she has never smoked. She has never used smokeless tobacco. She reports that she drinks alcohol. She reports that she does not use drugs.  Review of Systems: Review of Systems  Constitutional: Negative.   HENT: Negative.   Eyes: Negative.   Respiratory: Negative.   Cardiovascular: Negative.   Gastrointestinal: Negative.   Genitourinary: Negative.   Musculoskeletal: Negative.   Skin: Negative.   Neurological: Negative.   Endo/Heme/Allergies: Negative.   Psychiatric/Behavioral: Negative.     Physical Exam: Estimated body mass index is 37.2 kg/m as calculated from the following:   Height as of 09/18/16: 5\' 3"  (1.6 m).   Weight as of 09/18/16: 210 lb (95.3 kg). There were no vitals taken for this visit. General Appearance: Well nourished, in no apparent distress.  Eyes: PERRLA, EOMs, conjunctiva no swelling or erythema, normal fundi and vessels.  Sinuses: No Frontal/maxillary tenderness  ENT/Mouth: Ext aud canals clear, normal light reflex with TMs without  erythema, bulging. Good dentition. No erythema, swelling, or exudate on post pharynx. Tonsils not swollen or erythematous. Hearing normal.  Neck: Supple, thyroid normal. No bruits  Respiratory: Respiratory effort normal, BS equal bilaterally without rales, rhonchi, wheezing or stridor.  Cardio: RRR without murmurs, rubs or gallops. Brisk peripheral pulses without edema.  Chest: symmetric, with normal excursions and percussion.  Breasts: Symmetric, without lumps, nipple discharge, retractions.  Abdomen: Soft, nontender, no guarding, rebound, hernias, masses, or organomegaly.  Lymphatics: Non tender without lymphadenopathy.  Genitourinary:  Musculoskeletal: Full ROM all peripheral extremities,5/5 strength, and normal gait.  Skin: Warm, dry without rashes, lesions, ecchymosis. Neuro: Cranial nerves intact, reflexes equal bilaterally. Normal muscle tone, no cerebellar symptoms. Sensation intact.  Psych: Awake and oriented X 3, normal affect, Insight and Judgment appropriate.   EKG: defer  Quentin Mulling 12:53 PM [facility]

## 2016-11-06 ENCOUNTER — Encounter: Payer: Self-pay | Admitting: Physician Assistant

## 2016-11-06 LAB — BASIC METABOLIC PANEL WITH GFR
BUN: 11 mg/dL (ref 7–25)
CO2: 24 mmol/L (ref 20–31)
CREATININE: 1.06 mg/dL (ref 0.50–1.10)
Calcium: 9.7 mg/dL (ref 8.6–10.2)
Chloride: 105 mmol/L (ref 98–110)
GFR, EST AFRICAN AMERICAN: 81 mL/min (ref >=60)
GFR, Est Non African American: 71 mL/min (ref >=60)
GLUCOSE: 77 mg/dL (ref 65–99)
Potassium: 4.7 mmol/L (ref 3.5–5.3)
Sodium: 140 mmol/L (ref 135–146)

## 2016-11-06 LAB — MICROALBUMIN / CREATININE URINE RATIO
Creatinine, Urine: 154 mg/dL (ref 20–320)
MICROALB UR: 0.5 mg/dL
MICROALB/CREAT RATIO: 3 ug/mg{creat} (ref <30)

## 2016-11-06 LAB — HEPATIC FUNCTION PANEL
ALBUMIN: 4.2 g/dL (ref 3.6–5.1)
ALT: 23 U/L (ref 6–29)
AST: 19 U/L (ref 10–30)
Alkaline Phosphatase: 84 U/L (ref 33–115)
BILIRUBIN INDIRECT: 0.4 mg/dL (ref 0.2–1.2)
Bilirubin, Direct: 0.1 mg/dL (ref <=0.2)
TOTAL PROTEIN: 7.5 g/dL (ref 6.1–8.1)
Total Bilirubin: 0.5 mg/dL (ref 0.2–1.2)

## 2016-11-06 LAB — URINALYSIS, MICROSCOPIC ONLY
BACTERIA UA: NONE SEEN [HPF]
CASTS: NONE SEEN [LPF]
CRYSTALS: NONE SEEN [HPF]
YEAST: NONE SEEN [HPF]

## 2016-11-06 LAB — IRON AND TIBC
%SAT: 14 % (ref 11–50)
IRON: 58 ug/dL (ref 40–190)
TIBC: 404 ug/dL (ref 250–450)
UIBC: 346 ug/dL (ref 125–400)

## 2016-11-06 LAB — LIPID PANEL
Cholesterol: 186 mg/dL (ref <200)
HDL: 44 mg/dL — ABNORMAL LOW (ref >50)
LDL Cholesterol: 79 mg/dL (ref <100)
Total CHOL/HDL Ratio: 4.2 Ratio (ref <5.0)
Triglycerides: 315 mg/dL — ABNORMAL HIGH (ref <150)
VLDL: 63 mg/dL — AB (ref <30)

## 2016-11-06 LAB — FERRITIN: Ferritin: 120 ng/mL (ref 10–154)

## 2016-11-06 LAB — MAGNESIUM: Magnesium: 2.1 mg/dL (ref 1.5–2.5)

## 2016-11-06 LAB — VITAMIN B12: Vitamin B-12: 805 pg/mL (ref 200–1100)

## 2016-11-06 LAB — VITAMIN D 25 HYDROXY (VIT D DEFICIENCY, FRACTURES): Vit D, 25-Hydroxy: 38 ng/mL (ref 30–100)

## 2016-11-19 ENCOUNTER — Encounter: Payer: Self-pay | Admitting: Physician Assistant

## 2016-11-21 ENCOUNTER — Encounter: Payer: Self-pay | Admitting: Physician Assistant

## 2016-11-21 ENCOUNTER — Other Ambulatory Visit: Payer: Self-pay | Admitting: Internal Medicine

## 2016-11-21 ENCOUNTER — Other Ambulatory Visit: Payer: Self-pay | Admitting: Physician Assistant

## 2016-11-21 DIAGNOSIS — R51 Headache: Principal | ICD-10-CM

## 2016-11-21 DIAGNOSIS — R519 Headache, unspecified: Secondary | ICD-10-CM

## 2016-11-21 DIAGNOSIS — F411 Generalized anxiety disorder: Secondary | ICD-10-CM

## 2016-11-26 ENCOUNTER — Encounter: Payer: Self-pay | Admitting: Physician Assistant

## 2016-11-30 ENCOUNTER — Encounter: Payer: Self-pay | Admitting: Physician Assistant

## 2016-12-01 ENCOUNTER — Other Ambulatory Visit: Payer: Self-pay | Admitting: Physician Assistant

## 2016-12-02 ENCOUNTER — Ambulatory Visit: Payer: BLUE CROSS/BLUE SHIELD | Admitting: Adult Health

## 2016-12-04 ENCOUNTER — Ambulatory Visit (INDEPENDENT_AMBULATORY_CARE_PROVIDER_SITE_OTHER): Payer: BLUE CROSS/BLUE SHIELD | Admitting: *Deleted

## 2016-12-04 DIAGNOSIS — R51 Headache: Secondary | ICD-10-CM

## 2016-12-04 DIAGNOSIS — R519 Headache, unspecified: Secondary | ICD-10-CM

## 2016-12-04 MED ORDER — KETOROLAC TROMETHAMINE 30 MG/ML IJ SOLN
60.0000 mg | Freq: Once | INTRAMUSCULAR | Status: AC
  Administered 2016-12-04: 60 mg via INTRAMUSCULAR

## 2016-12-04 NOTE — Progress Notes (Signed)
Patient presents for Toradol injection for headache symptoms per Terri Piedraourtney Forcucci, PA-C orders.

## 2016-12-22 ENCOUNTER — Encounter: Payer: Self-pay | Admitting: Physician Assistant

## 2016-12-22 ENCOUNTER — Ambulatory Visit (INDEPENDENT_AMBULATORY_CARE_PROVIDER_SITE_OTHER): Payer: BLUE CROSS/BLUE SHIELD | Admitting: Physician Assistant

## 2016-12-22 VITALS — BP 138/100 | HR 81 | Temp 97.3°F | Resp 16 | Ht 63.0 in | Wt 216.2 lb

## 2016-12-22 DIAGNOSIS — K219 Gastro-esophageal reflux disease without esophagitis: Secondary | ICD-10-CM

## 2016-12-22 DIAGNOSIS — I10 Essential (primary) hypertension: Secondary | ICD-10-CM | POA: Diagnosis not present

## 2016-12-22 DIAGNOSIS — A084 Viral intestinal infection, unspecified: Secondary | ICD-10-CM | POA: Diagnosis not present

## 2016-12-22 MED ORDER — ONDANSETRON HCL 4 MG PO TABS: 4.0000 mg | ORAL_TABLET | Freq: Every day | ORAL | 1 refills | Status: DC | PRN

## 2016-12-22 MED ORDER — LISDEXAMFETAMINE DIMESYLATE 20 MG PO CAPS: 20.0000 mg | ORAL_CAPSULE | Freq: Every day | ORAL | 0 refills | Status: DC

## 2016-12-22 MED ORDER — METOCLOPRAMIDE HCL 10 MG PO TABS: ORAL_TABLET | ORAL | 0 refills | Status: DC

## 2016-12-22 NOTE — Patient Instructions (Signed)
Food Choices to Help Relieve Diarrhea, Adult When you have diarrhea, the foods you eat and your eating habits are very important. Choosing the right foods and drinks can help:  Relieve diarrhea.  Replace lost fluids and nutrients.  Prevent dehydration.  What general guidelines should I follow? Relieving diarrhea  Choose foods with less than 2 g or .07 oz. of fiber per serving.  Limit fats to less than 8 tsp (38 g or 1.34 oz.) a day.  Avoid the following: ? Foods and beverages sweetened with high-fructose corn syrup, honey, or sugar alcohols such as xylitol, sorbitol, and mannitol. ? Foods that contain a lot of fat or sugar. ? Fried, greasy, or spicy foods. ? High-fiber grains, breads, and cereals. ? Raw fruits and vegetables.  Eat foods that are rich in probiotics. These foods include dairy products such as yogurt and fermented milk products. They help increase healthy bacteria in the stomach and intestines (gastrointestinal tract, or GI tract).  If you have lactose intolerance, avoid dairy products. These may make your diarrhea worse.  Take medicine to help stop diarrhea (antidiarrheal medicine) only as told by your health care provider. Replacing nutrients  Eat small meals or snacks every 3-4 hours.  Eat bland foods, such as white rice, toast, or baked potato, until your diarrhea starts to get better. Gradually reintroduce nutrient-rich foods as tolerated or as told by your health care provider. This includes: ? Well-cooked protein foods. ? Peeled, seeded, and soft-cooked fruits and vegetables. ? Low-fat dairy products.  Take vitamin and mineral supplements as told by your health care provider. Preventing dehydration   Start by sipping water or a special solution to prevent dehydration (oral rehydration solution, ORS). Urine that is clear or pale yellow means that you are getting enough fluid.  Try to drink at least 8-10 cups of fluid each day to help replace lost  fluids.  You may add other liquids in addition to water, such as clear juice or decaffeinated sports drinks, as tolerated or as told by your health care provider.  Avoid drinks with caffeine, such as coffee, tea, or soft drinks.  Avoid alcohol. What foods are recommended? The items listed may not be a complete list. Talk with your health care provider about what dietary choices are best for you. Grains White rice. White, French, or pita breads (fresh or toasted), including plain rolls, buns, or bagels. White pasta. Saltine, soda, or graham crackers. Pretzels. Low-fiber cereal. Cooked cereals made with water (such as cornmeal, farina, or cream cereals). Plain muffins. Matzo. Melba toast. Zwieback. Vegetables Potatoes (without the skin). Most well-cooked and canned vegetables without skins or seeds. Tender lettuce. Fruits Apple sauce. Fruits canned in juice. Cooked apricots, cherries, grapefruit, peaches, pears, or plums. Fresh bananas and cantaloupe. Meats and other protein foods Baked or boiled chicken. Eggs. Tofu. Fish. Seafood. Smooth nut butters. Ground or well-cooked tender beef, ham, veal, lamb, pork, or poultry. Dairy Plain yogurt, kefir, and unsweetened liquid yogurt. Lactose-free milk, buttermilk, skim milk, or soy milk. Low-fat or nonfat hard cheese. Beverages Water. Low-calorie sports drinks. Fruit juices without pulp. Strained tomato and vegetable juices. Decaffeinated teas. Sugar-free beverages not sweetened with sugar alcohols. Oral rehydration solutions, if approved by your health care provider. Seasoning and other foods Bouillon, broth, or soups made from recommended foods. What foods are not recommended? The items listed may not be a complete list. Talk with your health care provider about what dietary choices are best for you. Grains Whole grain, whole wheat,   bran, or rye breads, rolls, pastas, and crackers. Wild or brown rice. Whole grain or bran cereals. Barley. Oats and  oatmeal. Corn tortillas or taco shells. Granola. Popcorn. Vegetables Raw vegetables. Fried vegetables. Cabbage, broccoli, Brussels sprouts, artichokes, baked beans, beet greens, corn, kale, legumes, peas, sweet potatoes, and yams. Potato skins. Cooked spinach and cabbage. Fruits Dried fruit, including raisins and dates. Raw fruits. Stewed or dried prunes. Canned fruits with syrup. Meat and other protein foods Fried or fatty meats. Deli meats. Chunky nut butters. Nuts and seeds. Beans and lentils. Bacon. Hot dogs. Sausage. Dairy High-fat cheeses. Whole milk, chocolate milk, and beverages made with milk, such as milk shakes. Half-and-half. Cream. sour cream. Ice cream. Beverages Caffeinated beverages (such as coffee, tea, soda, or energy drinks). Alcoholic beverages. Fruit juices with pulp. Prune juice. Soft drinks sweetened with high-fructose corn syrup or sugar alcohols. High-calorie sports drinks. Fats and oils Butter. Cream sauces. Margarine. Salad oils. Plain salad dressings. Olives. Avocados. Mayonnaise. Sweets and desserts Sweet rolls, doughnuts, and sweet breads. Sugar-free desserts sweetened with sugar alcohols such as xylitol and sorbitol. Seasoning and other foods Honey. Hot sauce. Chili powder. Gravy. Cream-based or milk-based soups. Pancakes and waffles. Summary  When you have diarrhea, the foods you eat and your eating habits are very important.  Make sure you get at least 8-10 cups of fluid each day, or enough to keep your urine clear or pale yellow.  Eat bland foods and gradually reintroduce healthy, nutrient-rich foods as tolerated, or as told by your health care provider.  Avoid high-fiber, fried, greasy, or spicy foods. This information is not intended to replace advice given to you by your health care provider. Make sure you discuss any questions you have with your health care provider. Document Released: 12/13/2003 Document Revised: 09/19/2016 Document Reviewed:  09/19/2016 Elsevier Interactive Patient Education  2017 Else[employer] Inc[employer]  

## 2016-12-22 NOTE — Progress Notes (Signed)
Assessment and Plan: 1. Viral gastroenteritis - metoCLOPramide (REGLAN) 10 MG tablet; Take at onset of migraine. Can take it every 6 hours if needed.  Dispense: 60 tablet; Refill: 0 - ondansetron (ZOFRAN) 4 MG tablet; Take 1 tablet (4 mg total) by mouth daily as needed for nausea or vomiting.  Dispense: 30 tablet; Refill: 1  2. Gastroesophageal reflux disease, esophagitis presence not specified Bland diet, prilosec  3. Hypertension, unspecified type Monitor at home  Future Appointments Date Time Provider Department Center  01/07/2017 3:00 PM Butch PennyMegan Millikan, NP [facility] None  02/05/2017 3:00 PM Quentin MullingAmanda Beryl Hornberger, PA-C [facility] None  11/09/2017 3:00 PM Quentin MullingAmanda Tayvia Faughnan, PA-C [facility] None     HPI 31 y.o.female presents for follow up. She was started on phentermine last OV but had cold sweats with it and stopped it. Is on adderall but has physical symptoms with it, sweating, would like to try vyvanse.  She complains of nausea and burping x Thursday, no GERD. Was after eating salad from restaurant, some diarrhea, some mucus in stool, no blood, no dark black stools.  Blood pressure (!) 138/100, pulse 81, temperature 97.3 F (36.3 C), resp. rate 16, height 5\' 3"  (1.6 m), weight 216 lb 3.2 oz (98.1 kg), SpO2 98 %.    Past Medical History:  Diagnosis Date  . Anxiety   . Depression   . Headache(784.0)    migraines  . Hypertension   . IBS (irritable bowel syndrome)   . Insulin resistance   . Seasonal allergies   . Urticaria      No Known Allergies  Current Outpatient Prescriptions on File Prior to Visit  Medication Sig  . albuterol (PROVENTIL HFA;VENTOLIN HFA) 108 (90 Base) MCG/ACT inhaler Inhale 2 puffs into the lungs every 2 (two) hours as needed for wheezing or shortness of breath (cough).  . ALPRAZolam (XANAX) 1 MG tablet TAKE 1/2 TO 1 TABLET TWICE DAILY AS NEEDED FOR ANXIETY  . amphetamine-dextroamphetamine (ADDERALL) 10 MG tablet Take 1 tablet (10 mg total) by mouth 2 (two)  times daily with a meal.  . atenolol (TENORMIN) 50 MG tablet TAKE ONE TABLET AT BEDTIME  . azelastine (OPTIVAR) 0.05 % ophthalmic solution Place 1 drop into both eyes 2 (two) times daily. (Patient taking differently: Place 1 drop into both eyes 2 (two) times daily as needed. )  . Biotin 10 MG TABS Take by mouth daily.  . cetirizine (ZYRTEC) 10 MG tablet Take 10 mg by mouth daily as needed for allergies.  . Chlorphen-PE-Acetaminophen 4-10-325 MG TABS Take 1 tablet by mouth 3 (three) times daily as needed.  . Cholecalciferol (VITAMIN D3) 10000 units capsule Take 10,000 Units by mouth daily.  Marland Kitchen. co-enzyme Q-10 30 MG capsule Take 30 mg by mouth daily.  Marland Kitchen. Hyoscyamine Sulfate SL (LEVSIN/SL) 0.125 MG SUBL Place 0.125 mg under the tongue every 4 (four) hours as needed.  . metoCLOPramide (REGLAN) 10 MG tablet Take at onset of migraine. Can take it every 6 hours if needed.  . montelukast (SINGULAIR) 10 MG tablet Take 1 tablet (10 mg total) by mouth at bedtime.  . Norethindrone Acetate-Ethinyl Estrad-FE (LOESTRIN 24 FE) 1-20 MG-MCG(24) tablet   . omeprazole (PRILOSEC) 40 MG capsule TAKE ONE CAPSULE EACH DAY  . Prenatal MV-Min-Fe Fum-FA-DHA (PRENATAL 1 PO) Take by mouth.  Marland Kitchen. PROCTOZONE-HC 2.5 % rectal cream APPLY RECTALLY TWICE DAILY AS NEEDED AFTER BOWEL MOVEMENT  . ranitidine (ZANTAC) 300 MG tablet TAKE ONE TABLET AT BEDTIME  . SUMAtriptan (IMITREX) 6 MG/0.5ML SOLN injection  0.61ml subQ for headache. May repeat in 2 hrs if headache persists, no more than 2 shots in 24 hrs.  . venlafaxine XR (EFFEXOR-XR) 75 MG 24 hr capsule TAKE ONE CAPSULE EACH DAY  . vitamin B-12 (CYANOCOBALAMIN) 1000 MCG tablet Take 1,000 mcg by mouth every other day.  . [DISCONTINUED] Drospirenone-Ethinyl Estradiol-Levomefol (SAFYRAL) 3-0.03-0.451 MG tablet Take 1 tablet by mouth daily.   No current facility-administered medications on file prior to visit.     ROS: all negative except above.   Physical Exam: Filed Weights    12/22/16 1627  Weight: 216 lb 3.2 oz (98.1 kg)   BP (!) 138/100   Pulse 81   Temp 97.3 F (36.3 C)   Resp 16   Ht 5\' 3"  (1.6 m)   Wt 216 lb 3.2 oz (98.1 kg)   SpO2 98%   BMI 38.30 kg/m  General Appearance: Well nourished, in no apparent distress. Eyes: PERRLA, EOMs, conjunctiva no swelling or erythema Sinuses: No Frontal/maxillary tenderness ENT/Mouth: Ext aud canals clear, TMs without erythema, bulging. No erythema, swelling, or exudate on post pharynx.  Tonsils not swollen or erythematous. Hearing normal.  Neck: Supple, thyroid normal.  Respiratory: Respiratory effort normal, BS equal bilaterally without rales, rhonchi, wheezing or stridor.  Cardio: RRR with no MRGs. Brisk peripheral pulses without edema.  Abdomen: Soft, + BS.  Non tender, no guarding, rebound, hernias, masses. Lymphatics: Non tender without lymphadenopathy.  Musculoskeletal: Full ROM, 5/5 strength, normal gait.  Skin: Warm, dry without rashes, lesions, ecchymosis.  Neuro: Cranial nerves intact. Normal muscle tone, no cerebellar symptoms. Sensation intact.  Psych: Awake and oriented X 3, normal affect, Insight and Judgment appropriate.     Quentin Mulling, PA-C 4:56 PM [facility]

## 2016-12-30 ENCOUNTER — Encounter: Payer: Self-pay | Admitting: Physician Assistant

## 2016-12-31 ENCOUNTER — Other Ambulatory Visit: Payer: Self-pay | Admitting: Physician Assistant

## 2016-12-31 DIAGNOSIS — N898 Other specified noninflammatory disorders of vagina: Secondary | ICD-10-CM

## 2017-01-03 ENCOUNTER — Encounter: Payer: Self-pay | Admitting: Physician Assistant

## 2017-01-07 ENCOUNTER — Ambulatory Visit (INDEPENDENT_AMBULATORY_CARE_PROVIDER_SITE_OTHER): Payer: BLUE CROSS/BLUE SHIELD | Admitting: Adult Health

## 2017-01-07 ENCOUNTER — Encounter: Payer: Self-pay | Admitting: Adult Health

## 2017-01-07 ENCOUNTER — Encounter: Payer: Self-pay | Admitting: Physician Assistant

## 2017-01-07 VITALS — BP 113/80 | HR 84 | Resp 16 | Ht 63.0 in | Wt 215.0 lb

## 2017-01-07 DIAGNOSIS — G43009 Migraine without aura, not intractable, without status migrainosus: Secondary | ICD-10-CM | POA: Diagnosis not present

## 2017-01-07 NOTE — Patient Instructions (Signed)
If headache frequency increases let us know If your symptoms worsen or you develop new symptoms please let us know.

## 2017-01-07 NOTE — Progress Notes (Signed)
PATIENT: Eileen Rios DOB: 03/12/1986  REASON FOR VISIT: follow up- headache HISTORY FROM: patient  HISTORY OF PRESENT ILLNESS: Eileen Rios is a 31 year old female with a history of headaches. She states that since the last visit she only had 3 migraine headaches. For these headaches she used the Imitrex injection with good benefit. She states that she has a mild headache one to 2 times a week and usually can take ibuprofen or Reglan and that offers her the benefit. She states that she remains on Effexor. Reports that she was started on this in high school for headaches and now she takes it more for headaches and mood. Overall she is doing well. She states by the end of the year she is considering having another child. She states that she will be in the process of weaning herself off of most of these medications. She still finds that she takes a nap every couple days her migraines are better as well. She returns today for an evaluation.  HISTORY 05/28/16: Eileen Rios is a 31 year old female with a history of headaches. She returns today for follow-up. At the last visit Cymbalta was increased to 40 mg daily. She reports that she was unable to tolerate Cymbalta. Her primary care's switched her Prozac. She states this has been working better for her. She states that she has found that if she takes a 3-4 hour nap every couple days this prevents her from having migraines. She is also wondering what to do to improve her sleep hygiene at night so that would not be necessary. She denies any new neurological symptoms. He returns today for an evaluation.  HISTORY 02/19/16:Eileen Rios is a 31 year old female with a history of headaches. She returns today for follow-up. She states that she continues to have daily headaches. At the last visit her Cymbalta was increased however s he states that she was already on 30 mg. She states that she does have severe allergies and is on several medications. She feels that some  of her headaches may be related to her allergies. She also states that she is under a lot of stress and has a history of obsessive-compulsive disorder. She also reports that she has a sleep disorder. 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 She has tried sleep agents but could not tolerate them. She is on Adderall to help with daytime sleepiness. She states that she only gets 1 true migraine a month. Most of her headaches are located in the temporal region. With severe headaches she does have photophobia and phonophobia and some nausea. She states that only oxycodone will help her severe headaches. She states that she will be seeing a therapist to help her deal with her stress. She also reports that she will be seen in ophthalmologist to be evaluated for glaucoma in the right eye. She returns today for an evaluation.   REVIEW OF SYSTEMS: Out of a complete 14 system review of symptoms, the patient complains only of the following symptoms, and all other reviewed systems are negative.  Rectal bleeding, headache, nervous/anxious, rash, itching, sleep talking, shift work, daytime sleepiness, eye itching, fatigue, fever  ALLERGIES: No Known Allergies  HOME MEDICATIONS: Outpatient Medications Prior to Visit  Medication Sig Dispense Refill  . ALPRAZolam (XANAX) 1 MG tablet TAKE 1/2 TO 1 TABLET TWICE DAILY AS NEEDED FOR ANXIETY 60 tablet 1  . amphetamine-dextroamphetamine (ADDERALL) 10 MG tablet Take 1 tablet (10 mg total) by mouth 2 (two)  times daily with a meal. 60 tablet 0  . atenolol (TENORMIN) 50 MG tablet TAKE ONE TABLET AT BEDTIME 90 tablet 0  . azelastine (OPTIVAR) 0.05 % ophthalmic solution Place 1 drop into both eyes 2 (two) times daily. (Patient taking differently: Place 1 drop into both eyes 2 (two) times daily as needed. ) 6 mL 5  . Biotin 10 MG TABS Take by mouth daily.    . cetirizine (ZYRTEC) 10 MG tablet Take 10 mg by mouth daily as needed for allergies.    .  Chlorphen-PE-Acetaminophen 4-10-325 MG TABS Take 1 tablet by mouth 3 (three) times daily as needed. 30 tablet 0  . Cholecalciferol (VITAMIN D3) 5000 units TABS Take 5,000 Units by mouth daily.     Marland Kitchen co-enzyme Q-10 30 MG capsule Take 30 mg by mouth daily.    Marland Kitchen Hyoscyamine Sulfate SL (LEVSIN/SL) 0.125 MG SUBL Place 0.125 mg under the tongue every 4 (four) hours as needed. 30 each 3  . lisdexamfetamine (VYVANSE) 20 MG capsule Take 1 capsule (20 mg total) by mouth daily. (Patient taking differently: Take 40 mg by mouth daily. ) 30 capsule 0  . metoCLOPramide (REGLAN) 10 MG tablet Take at onset of migraine. Can take it every 6 hours if needed. 60 tablet 0  . montelukast (SINGULAIR) 10 MG tablet Take 1 tablet (10 mg total) by mouth at bedtime. 30 tablet 5  . Norethindrone Acetate-Ethinyl Estrad-FE (LOESTRIN 24 FE) 1-20 MG-MCG(24) tablet   5  . nystatin (MYCOSTATIN/NYSTOP) powder APPLY DAILY AFTER A SHOWER AS NEEDED 60 g 1  . omeprazole (PRILOSEC) 40 MG capsule TAKE ONE CAPSULE EACH DAY 90 capsule 1  . ondansetron (ZOFRAN) 4 MG tablet Take 1 tablet (4 mg total) by mouth daily as needed for nausea or vomiting. 30 tablet 1  . Prenatal MV-Min-Fe Fum-FA-DHA (PRENATAL 1 PO) Take by mouth.    Marland Kitchen PROCTOZONE-HC 2.5 % rectal cream APPLY RECTALLY TWICE DAILY AS NEEDED AFTER BOWEL MOVEMENT 30 g 3  . ranitidine (ZANTAC) 300 MG tablet TAKE ONE TABLET AT BEDTIME 30 tablet 1  . SUMAtriptan (IMITREX) 6 MG/0.5ML SOLN injection 0.17ml subQ for headache. May repeat in 2 hrs if headache persists, no more than 2 shots in 24 hrs. 8 vial 2  . venlafaxine XR (EFFEXOR-XR) 75 MG 24 hr capsule TAKE ONE CAPSULE EACH DAY 90 capsule 0  . vitamin B-12 (CYANOCOBALAMIN) 1000 MCG tablet Take 1,000 mcg by mouth every other day.    . albuterol (PROVENTIL HFA;VENTOLIN HFA) 108 (90 Base) MCG/ACT inhaler Inhale 2 puffs into the lungs every 2 (two) hours as needed for wheezing or shortness of breath (cough). 1 Inhaler 0   No  facility-administered medications prior to visit.     PAST MEDICAL HISTORY: Past Medical History:  Diagnosis Date  . Anxiety   . Depression   . Headache(784.0)    migraines  . Hypertension   . IBS (irritable bowel syndrome)   . Insulin resistance   . Seasonal allergies   . Urticaria     PAST SURGICAL HISTORY: Past Surgical History:  Procedure Laterality Date  . CESAREAN SECTION N/A 06/05/2013   Procedure: Primary cesarean section with delivery of baby boy at 1033.  Apgars 1/8.;  Surgeon: Genia Del, MD;  Location: [facility];  Service: Obstetrics;  Laterality: N/A;  . WISDOM TOOTH EXTRACTION      FAMILY HISTORY: Family History  Problem Relation Age of Onset  . Diabetes Mother   . Hypertension Father   .  Migraines Neg Hx     SOCIAL HISTORY: Social History   Social History  . Marital status: Married    Spouse name: N/A  . Number of children: N/A  . Years of education: N/A   Occupational History  . Not on file.   Social History Main Topics  . Smoking status: Never Smoker  . Smokeless tobacco: Never Used  . Alcohol use Yes  . Drug use: No  . Sexual activity: Yes   Other Topics Concern  . Not on file   Social History Narrative  . No narrative on file      PHYSICAL EXAM  Vitals:   01/07/17 1508  BP: 113/80  Pulse: 84  Resp: 16  Weight: 215 lb (97.5 kg)  Height:  (1.6 m)   Body mass index is 38.09 kg/m.  Generalized: Well developed, in no acute distress   Neurological examination  Mentation: Alert oriented to time, place, history taking. Follows all commands speech and language fluent Cranial nerve II-XII: Pupils were equal round reactive to light. Extraocular movements were full, visual field were full on confrontational test. Facial sensation and strength were normal. Uvula tongue midline. Head turning and shoulder shrug  were normal and symmetric. Motor: The motor testing reveals 5 over 5 strength of all 4 extremities. Good symmetric  motor tone is noted throughout.  Sensory: Sensory testing is intact to soft touch on all 4 extremities. No evidence of extinction is noted.  Coordination: Cerebellar testing reveals good finger-nose-finger and heel-to-shin bilaterally.  Gait and station: Gait is normal. Tandem gait is normal. Romberg is negative. No drift is seen.  Reflexes: Deep tendon reflexes are symmetric and normal bilaterally.   DIAGNOSTIC DATA (LABS, IMAGING, TESTING) - I reviewed patient records, labs, notes, testing and imaging myself where available.  Lab Results  Component Value Date   WBC 8.3 11/05/2016   HGB 13.7 11/05/2016   HCT 40.3 11/05/2016   MCV 88.4 11/05/2016   PLT 295 11/05/2016      Component Value Date/Time   NA 140 11/05/2016 1549   K 4.7 11/05/2016 1549   CL 105 11/05/2016 1549   CO2 24 11/05/2016 1549   GLUCOSE 77 11/05/2016 1549   BUN 11 11/05/2016 1549   CREATININE 1.06 11/05/2016 1549   CALCIUM 9.7 11/05/2016 1549   PROT 7.5 11/05/2016 1549   ALBUMIN 4.2 11/05/2016 1549   AST 19 11/05/2016 1549   ALT 23 11/05/2016 1549   ALKPHOS 84 11/05/2016 1549   BILITOT 0.5 11/05/2016 1549   GFRNONAA 71 11/05/2016 1549   GFRAA 81 11/05/2016 1549   Lab Results  Component Value Date   CHOL 186 11/05/2016   HDL 44 (L) 11/05/2016   LDLCALC 79 11/05/2016   TRIG 315 (H) 11/05/2016   CHOLHDL 4.2 11/05/2016   Lab Results  Component Value Date   HGBA1C 5.0 11/05/2016   Lab Results  Component Value Date   VITAMINB12 805 11/05/2016   Lab Results  Component Value Date   TSH 1.07 11/05/2016      ASSESSMENT AND PLAN 31 y.o. year old female  has a past medical history of Anxiety; Depression; Headache(784.0); Hypertension; IBS (irritable bowel syndrome); Insulin resistance; Seasonal allergies; and Urticaria. here with :  1. Migraine headaches  Overall the patient is doing well. Her primary care is prescribing her Effexor and the Imitrex injections. The patient plans to wean off of  these medications once she starts trying to conceive. Patient is advised that if her  headache frequency increases she should let us know. She will follow-up in 6 months or sooner if needed.  I spent 15 minutes with the patient 50% of this time was spent discussing medications that are safe in pregnancy.     Butch Penny, MSN, NP-C 01/07/2017, 3:47 PM [facility] [address] [place], [place] [address] [phone]

## 2017-01-08 NOTE — Progress Notes (Signed)
I have reviewed and agreed above plan. 

## 2017-01-09 MED ORDER — LISDEXAMFETAMINE DIMESYLATE 40 MG PO CAPS: 40.0000 mg | ORAL_CAPSULE | Freq: Every day | ORAL | 0 refills | Status: DC

## 2017-01-11 ENCOUNTER — Other Ambulatory Visit: Payer: Self-pay | Admitting: Physician Assistant

## 2017-02-03 ENCOUNTER — Encounter: Payer: Self-pay | Admitting: Physician Assistant

## 2017-02-03 ENCOUNTER — Other Ambulatory Visit: Payer: Self-pay | Admitting: Physician Assistant

## 2017-02-03 DIAGNOSIS — F411 Generalized anxiety disorder: Secondary | ICD-10-CM

## 2017-02-03 NOTE — Telephone Encounter (Signed)
Xanax called into pharmacy on 03 Feb 2017 @ 3:11pm  By DD

## 2017-02-04 MED ORDER — AMPHETAMINE-DEXTROAMPHETAMINE 10 MG PO TABS: 10.0000 mg | ORAL_TABLET | Freq: Two times a day (BID) | ORAL | 0 refills | Status: DC

## 2017-02-05 ENCOUNTER — Ambulatory Visit: Payer: Self-pay | Admitting: Physician Assistant

## 2017-02-11 ENCOUNTER — Other Ambulatory Visit: Payer: Self-pay

## 2017-02-11 ENCOUNTER — Ambulatory Visit (INDEPENDENT_AMBULATORY_CARE_PROVIDER_SITE_OTHER): Payer: BLUE CROSS/BLUE SHIELD | Admitting: Physician Assistant

## 2017-02-11 ENCOUNTER — Encounter: Payer: Self-pay | Admitting: Physician Assistant

## 2017-02-11 VITALS — BP 118/74 | HR 83 | Temp 97.4°F | Resp 16 | Ht 63.0 in | Wt 212.4 lb

## 2017-02-11 DIAGNOSIS — M654 Radial styloid tenosynovitis [de Quervain]: Secondary | ICD-10-CM

## 2017-02-11 DIAGNOSIS — L258 Unspecified contact dermatitis due to other agents: Secondary | ICD-10-CM | POA: Diagnosis not present

## 2017-02-11 DIAGNOSIS — G5603 Carpal tunnel syndrome, bilateral upper limbs: Secondary | ICD-10-CM | POA: Diagnosis not present

## 2017-02-11 MED ORDER — RIZATRIPTAN BENZOATE 10 MG PO TABS: 10.0000 mg | ORAL_TABLET | Freq: Once | ORAL | 2 refills | Status: DC | PRN

## 2017-02-11 MED ORDER — CLINDAMYCIN PHOSPHATE 1 % EX GEL: 1.0000 "application " | Freq: Two times a day (BID) | CUTANEOUS | 2 refills | Status: AC

## 2017-02-11 MED ORDER — DEXAMETHASONE SODIUM PHOSPHATE 100 MG/10ML IJ SOLN
10.0000 mg | Freq: Once | INTRAMUSCULAR | Status: AC
  Administered 2017-02-11: 10 mg via INTRAMUSCULAR

## 2017-02-11 NOTE — Patient Instructions (Addendum)
Do carpal tunnel brace right hand And left hand thumb spica splint Will refer to ortho  PRINT OUT COUPON FOR MAXALT  De Quervain Tenosynovitis Tendons attach muscles to bones. They also help with joint movements. When tendons become irritated or swollen, it is called tendinitis. The extensor pollicis brevis (EPB) tendon connects the EPB muscle to a bone that is near the base of the thumb. The EPB muscle helps to straighten and extend the thumb. De Quervain tenosynovitis is a condition in which the EPB tendon lining (sheath) becomes irritated, thickened, and swollen. This condition is sometimes called stenosing tenosynovitis. This condition causes pain on the thumb side of the back of the wrist. What are the causes? Causes of this condition include:  Activities that repeatedly cause your thumb and wrist to extend.  A sudden increase in activity or change in activity that affects your wrist. What increases the risk? This condition is more likely to develop in:  Females.  People who have diabetes.  Women who have recently given birth.  People who are over 60 years of age.  People who do activities that involve repeated hand and wrist motions, such as tennis, racquetball, volleyball, gardening, and taking care of children.  People who do heavy labor.  People who have poor wrist strength and flexibility.  People who do not warm up properly before activities. What are the signs or symptoms? Symptoms of this condition include:  Pain or tenderness over the thumb side of the back of the wrist when your thumb and wrist are not moving.  Pain that gets worse when you straighten your thumb or extend your thumb or wrist.  Pain when the injured area is touched.  Locking or catching of the thumb joint while you bend and straighten your thumb.  Decreased thumb motion due to pain.  Swelling over the affected area. How is this diagnosed? This condition is diagnosed with a medical history  and physical exam. Your health care provider will ask for details about your injury and ask about your symptoms. How is this treated? Treatment may include the use of icing and medicines to reduce pain and swelling. You may also be advised to wear a splint or brace to limit your thumb and wrist motion. In less severe cases, treatment may also include working with a physical therapist to strengthen your wrist and calm the irritation around your EPB tendon sheath. In severe cases, surgery may be needed. Follow these instructions at home: If you have a splint or brace:   Wear it as told by your health care provider. Remove it only as told by your health care provider.  Loosen the splint or brace if your fingers become numb and tingle, or if they turn cold and blue.  Keep the splint or brace clean and dry. Managing pain, stiffness, and swelling   If directed, apply ice to the injured area.  Put ice in a plastic bag.  Place a towel between your skin and the bag.  Leave the ice on for 20 minutes, 2-3 times per day.  Move your fingers often to avoid stiffness and to lessen swelling.  Raise (elevate) the injured area above the level of your heart while you are sitting or lying down. General instructions   Return to your normal activities as told by your health care provider. Ask your health care provider what activities are safe for you.  Take over-the-counter and prescription medicines only as told by your health care provider.  Keep  all follow-up visits as told by your health care provider. This is important.  Do not drive or operate heavy machinery while taking prescription pain medicine. Contact a health care provider if:  Your pain, tenderness, or swelling gets worse, even if you have had treatment.  You have numbness or tingling in your wrist, hand, or fingers on the injured side. This information is not intended to replace advice given to you by your health care provider. Make  sure you discuss any questions you have with your health care provider. Document Released: 09/22/2005 Document Revised: 02/28/2016 Document Reviewed: 11/28/2014 Elsevier Interactive Patient Education  2017 [employer]    Carpal Tunnel Syndrome Carpal tunnel syndrome is a condition that causes pain in your hand and arm. The carpal tunnel is a narrow area located on the palm side of your wrist. Repeated wrist motion or certain diseases may cause swelling within the tunnel. This swelling pinches the main nerve in the wrist (median nerve). What are the causes? This condition may be caused by:  Repeated wrist motions.  Wrist injuries.  Arthritis.  A cyst or tumor in the carpal tunnel.  Fluid buildup during pregnancy. Sometimes the cause of this condition is not known. What increases the risk? This condition is more likely to develop in:  People who have jobs that cause them to repeatedly move their wrists in the same motion, such as Health visitor.  Women.  People with certain conditions, such as:  Diabetes.  Obesity.  An underactive thyroid (hypothyroidism).  Kidney failure. What are the signs or symptoms? Symptoms of this condition include:  A tingling feeling in your fingers, especially in your thumb, index, and middle fingers.  Tingling or numbness in your hand.  An aching feeling in your entire arm, especially when your wrist and elbow are bent for long periods of time.  Wrist pain that goes up your arm to your shoulder.  Pain that goes down into your palm or fingers.  A weak feeling in your hands. You may have trouble grabbing and holding items. Your symptoms may feel worse during the night. How is this diagnosed? This condition is diagnosed with a medical history and physical exam. You may also have tests, including:  An electromyogram (EMG). This test measures electrical signals sent by your nerves into the muscles.  X-rays. How is this  treated? Treatment for this condition includes:  Lifestyle changes. It is important to stop doing or modify the activity that caused your condition.  Physical or occupational therapy.  Medicines for pain and inflammation. This may include medicine that is injected into your wrist.  A wrist splint.  Surgery. Follow these instructions at home: If you have a splint:   Wear it as told by your health care provider. Remove it only as told by your health care provider.  Loosen the splint if your fingers become numb and tingle, or if they turn cold and blue.  Keep the splint clean and dry. General instructions   Take over-the-counter and prescription medicines only as told by your health care provider.  Rest your wrist from any activity that may be causing your pain. If your condition is work related, talk to your employer about changes that can be made, such as getting a wrist pad to use while typing.  If directed, apply ice to the painful area:  Put ice in a plastic bag.  Place a towel between your skin and the bag.  Leave the ice on for 20  minutes, 2-3 times per day.  Keep all follow-up visits as told by your health care provider. This is important.  Do any exercises as told by your health care provider, physical therapist, or occupational therapist. Contact a health care provider if:  You have new symptoms.  Your pain is not controlled with medicines.  Your symptoms get worse. This information is not intended to replace advice given to you by your health care provider. Make sure you discuss any questions you have with your health care provider. Document Released: 09/19/2000 Document Revised: 01/31/2016 Document Reviewed: 02/07/2015 Elsevier Interactive Patient Education  2017 [employer]    Contact Dermatitis Dermatitis is redness, soreness, and swelling (inflammation) of the skin. Contact dermatitis is a reaction to certain substances that touch the skin. There are  two types of contact dermatitis:  Irritant contact dermatitis. This type is caused by something that irritates your skin, such as dry hands from washing them too much. This type does not require previous exposure to the substance for a reaction to occur. This type is more common.  Allergic contact dermatitis. This type is caused by a substance that you are allergic to, such as a nickel allergy or poison ivy. This type only occurs if you have been exposed to the substance (allergen) before. Upon a repeat exposure, your body reacts to the substance. This type is less common. What are the causes? Many different substances can cause contact dermatitis. Irritant contact dermatitis is most commonly caused by exposure to:  Makeup.  Soaps.  Detergents.  Bleaches.  Acids.  Metal salts, such as nickel. Allergic contact dermatitis is most commonly caused by exposure to:  Poisonous plants.  Chemicals.  Jewelry.  Latex.  Medicines.  Preservatives in products, such as clothing. What increases the risk? This condition is more likely to develop in:  People who have jobs that expose them to irritants or allergens.  People who have certain medical conditions, such as asthma or eczema. What are the signs or symptoms? Symptoms of this condition may occur anywhere on your body where the irritant has touched you or is touched by you. Symptoms include:  Dryness or flaking.  Redness.  Cracks.  Itching.  Pain or a burning feeling.  Blisters.  Drainage of small amounts of blood or clear fluid from skin cracks. With allergic contact dermatitis, there may also be swelling in areas such as the eyelids, mouth, or genitals. How is this diagnosed? This condition is diagnosed with a medical history and physical exam. A patch skin test may be performed to help determine the cause. If the condition is related to your job, you may need to see an occupational medicine specialist. How is this  treated? Treatment for this condition includes figuring out what caused the reaction and protecting your skin from further contact. Treatment may also include:  Steroid creams or ointments. Oral steroid medicines may be needed in more severe cases.  Antibiotics or antibacterial ointments, if a skin infection is present.  Antihistamine lotion or an antihistamine taken by mouth to ease itching.  A bandage (dressing). Follow these instructions at home: Skin Care   Moisturize your skin as needed.  Apply cool compresses to the affected areas.  Try taking a bath with:  Epsom salts. Follow the instructions on the packaging. You can get these at your local pharmacy or grocery store.  Baking soda. Pour a small amount into the bath as directed by your health care provider.  Colloidal oatmeal. Follow the instructions  on the packaging. You can get this at your local pharmacy or grocery store.  Try applying baking soda paste to your skin. Stir water into baking soda until it reaches a paste-like consistency.  Do not scratch your skin.  Bathe less frequently, such as every other day.  Bathe in lukewarm water. Avoid using hot water. Medicines   Take or apply over-the-counter and prescription medicines only as told by your health care provider.  If you were prescribed an antibiotic medicine, take or apply your antibiotic as told by your health care provider. Do not stop using the antibiotic even if your condition starts to improve. General instructions   Keep all follow-up visits as told by your health care provider. This is important.  Avoid the substance that caused your reaction. If you do not know what caused it, keep a journal to try to track what caused it. Write down:  What you eat.  What cosmetic products you use.  What you drink.  What you wear in the affected area. This includes jewelry.  If you were given a dressing, take care of it as told by your health care provider.  This includes when to change and remove it. Contact a health care provider if:  Your condition does not improve with treatment.  Your condition gets worse.  You have signs of infection such as swelling, tenderness, redness, soreness, or warmth in the affected area.  You have a fever.  You have new symptoms. Get help right away if:  You have a severe headache, neck pain, or neck stiffness.  You vomit.  You feel very sleepy.  You notice red streaks coming from the affected area.  Your bone or joint underneath the affected area becomes painful after the skin has healed.  The affected area turns darker.  You have difficulty breathing. This information is not intended to replace advice given to you by your health care provider. Make sure you discuss any questions you have with your health care provider. Document Released: 09/19/2000 Document Revised: 02/28/2016 Document Reviewed: 02/07/2015 Elsevier Interactive Patient Education  2017 [employer] Inc.  Pityriasis Rosea Pityriasis rosea is a rash that usually appears on the trunk of the body. It may also appear on the upper arms and upper legs. It usually begins as a single patch, and then more patches begin to develop. The rash may cause mild itching, but it normally does not cause other problems. It usually goes away without treatment. However, it may take weeks or months for the rash to go away completely. What are the causes? The cause of this condition is not known. The condition does not spread from person to person (is noncontagious). What increases the risk? This condition is more likely to develop in young adults and children. It is most common in the spring and fall. What are the signs or symptoms? The main symptom of this condition is a rash.  The rash usually begins with a single oval patch that is larger than the ones that follow. This is called a herald patch. It generally appears a week or more before the rest of the  rash appears.  When more patches start to develop, they spread quickly on the trunk, back, and arms. These patches are smaller than the first one.  The patches that make up the rash are usually oval-shaped and pink or red in color. They are usually flat, but they may sometimes be raised so that they can be felt with a finger. They  may also be finely crinkled and have a scaly ring around the edge.  The rash does not typically appear on areas of the skin that are exposed to the sun. Most people who have this condition do not have other symptoms, but some have mild itching. In a few cases, a mild headache or body aches may occur before the rash appears and then go away. How is this diagnosed? Your health care provider may diagnose this condition by doing a physical exam and taking your medical history. To rule out other possible causes for the rash, the health care provider may order blood tests or take a skin sample from the rash to be looked at under a microscope. How is this treated? Usually, treatment is not needed for this condition. The rash will probably go away on its own in 4-8 weeks. In some cases, a health care provider may recommend or prescribe medicine to reduce itching. Follow these instructions at home:  Take medicines only as directed by your health care provider.  Avoid scratching the affected areas of skin.  Do not take hot baths or use a sauna. Use only warm water when bathing or showering. Heat can increase itching. Contact a health care provider if:  Your rash does not go away in 8 weeks.  Your rash gets much worse.  You have a fever.  You have swelling or pain in the rash area.  You have fluid, blood, or pus coming from the rash area. This information is not intended to replace advice given to you by your health care provider. Make sure you discuss any questions you have with your health care provider. Document Released: 10/29/2001 Document Revised: 02/28/2016  Document Reviewed: 08/30/2014 Elsevier Interactive Patient Education  2017 [employer].

## 2017-02-11 NOTE — Progress Notes (Signed)
Subjective:    Patient ID: Eileen Rios, female    DOB: July 03, 1986, 31 y.o.   MRN: [id number]  HPI 31 y.o. WF presents with rash on hands, bug bits and hand pain. 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 She switched from vyvanse to adderall and feeling better. Has had worsening wrist/hand pain, left wrist painful, right hand numb, was only at night but now during the day with driving, better if she shakes it out.   Blood pressure 118/74, pulse 83, temperature 97.4 F (36.3 C), resp. rate 16, height 5\' 3"  (1.6 m), weight 212 lb 6.4 oz (96.3 kg), SpO2 98 %.  Medications Current Outpatient Prescriptions on File Prior to Visit  Medication Sig  . ALPRAZolam (XANAX) 1 MG tablet TAKE 1/2 TO 1 TABLET TWICE DAILY AS NEEDED FOR ANXIETY  . amphetamine-dextroamphetamine (ADDERALL) 10 MG tablet Take 1 tablet (10 mg total) by mouth 2 (two) times daily with a meal.  . atenolol (TENORMIN) 50 MG tablet TAKE ONE TABLET AT BEDTIME  . azelastine (OPTIVAR) 0.05 % ophthalmic solution Place 1 drop into both eyes 2 (two) times daily. (Patient taking differently: Place 1 drop into both eyes 2 (two) times daily as needed. )  . Biotin 10 MG TABS Take by mouth daily.  . cetirizine (ZYRTEC) 10 MG tablet Take 10 mg by mouth daily as needed for allergies.  . Chlorphen-PE-Acetaminophen 4-10-325 MG TABS Take 1 tablet by mouth 3 (three) times daily as needed.  . Cholecalciferol (VITAMIN D3) 5000 units TABS Take 5,000 Units by mouth daily.   Marland Kitchen co-enzyme Q-10 30 MG capsule Take 30 mg by mouth daily.  Marland Kitchen Hyoscyamine Sulfate SL (LEVSIN/SL) 0.125 MG SUBL Place 0.125 mg under the tongue every 4 (four) hours as needed.  . metoCLOPramide (REGLAN) 10 MG tablet Take at onset of migraine. Can take it every 6 hours if needed.  . montelukast (SINGULAIR) 10 MG tablet Take 1 tablet (10 mg total) by mouth at bedtime.  . Norethindrone Acetate-Ethinyl Estrad-FE (LOESTRIN 24 FE) 1-20 MG-MCG(24) tablet   . nystatin  (MYCOSTATIN/NYSTOP) powder APPLY DAILY AFTER A SHOWER AS NEEDED  . omeprazole (PRILOSEC) 40 MG capsule TAKE ONE CAPSULE EACH DAY  . ondansetron (ZOFRAN) 4 MG tablet Take 1 tablet (4 mg total) by mouth daily as needed for nausea or vomiting.  . Prenatal MV-Min-Fe Fum-FA-DHA (PRENATAL 1 PO) Take by mouth.  Marland Kitchen PROCTOZONE-HC 2.5 % rectal cream APPLY RECTALLY TWICE DAILY AS NEEDED AFTER BOWEL MOVEMENT  . ranitidine (ZANTAC) 300 MG tablet TAKE ONE TABLET AT BEDTIME  . SUMAtriptan (IMITREX) 6 MG/0.5ML SOLN injection 0.23ml subQ for headache. May repeat in 2 hrs if headache persists, no more than 2 shots in 24 hrs.  . venlafaxine XR (EFFEXOR-XR) 75 MG 24 hr capsule TAKE ONE CAPSULE EACH DAY  . vitamin B-12 (CYANOCOBALAMIN) 1000 MCG tablet Take 1,000 mcg by mouth every other day.  . [DISCONTINUED] Drospirenone-Ethinyl Estradiol-Levomefol (SAFYRAL) 3-0.03-0.451 MG tablet Take 1 tablet by mouth daily.   No current facility-administered medications on file prior to visit.     Problem list She has Sinusitis, chronic; Allergic rhinitis; IBS; FEMALE INFERTILITY; ACNE VULGARIS; Fatigue; Malar rash; Migraines; Gestational hypertension; Obesity (BMI 30-39.9); Generalized anxiety disorder; and Insomnia on her problem list.  Review of Systems  Constitutional: Positive for fatigue. Negative for chills, diaphoresis and fever.  HENT: Positive for sinus pressure. Negative for congestion, ear discharge, ear pain, postnasal drip, rhinorrhea, sore throat, trouble swallowing and voice change.   Eyes:  Negative.   Respiratory: Negative.   Cardiovascular: Negative.   Gastrointestinal: Negative.   Genitourinary: Negative.   Musculoskeletal: Negative.        Except left wrist pain  Skin: Negative.  Negative for rash.  Neurological: Positive for headaches. Negative for dizziness and light-headedness.  Psychiatric/Behavioral: The patient is nervous/anxious.        Anxiety and stress and no depression      Objective:    Physical Exam  Constitutional: She is oriented to person, place, and time. She appears well-developed and well-nourished. She does not have a sickly appearance. No distress.  HENT:  Head: Normocephalic.  Eyes: Conjunctivae and lids are normal. Right eye exhibits no discharge. Left eye exhibits no discharge. No scleral icterus.  Neck: Normal range of motion and phonation normal.  Cardiovascular: Normal rate, regular rhythm, S1 normal, S2 normal, normal heart sounds, intact distal pulses and normal pulses.  Exam reveals no gallop, no distant heart sounds and no friction rub.   No murmur heard. Pulmonary/Chest: Effort normal and breath sounds normal. No respiratory distress. She has no decreased breath sounds. She has no wheezes. She has no rhonchi. She has no rales. She exhibits no tenderness.  Abdominal: Soft. Bowel sounds are normal.  Musculoskeletal: Normal range of motion.       Right wrist: Normal.       Left wrist: She exhibits tenderness. She exhibits normal range of motion, no bony tenderness, no swelling, no effusion, no crepitus, no deformity and no laceration.  + tinels and phalens, + left sided finkelstein test, normal nuerovascular distally, normal strength, no atrophy. Normal elbow.   Neurological: She is alert and oriented to person, place, and time. She has normal reflexes. No sensory deficit. Gait normal.  Strength slightly decreased in left wrist compared to right wrist.  Skin: Skin is warm and dry. Rash (bilateral hands with erythematous papules, no rash anywhere else, no on palm side) noted. She is not diaphoretic.  Psychiatric: She has a normal mood and affect. Her behavior is normal. Judgment and thought content normal.  Vitals reviewed.     Assessment & Plan:   Bilateral carpal tunnel syndrome Continue splint at night, RICE -     Ambulatory referral to Orthopedics  De Quervain's tenosynovitis, left Get thumb spica, info given -     Ambulatory referral to  Orthopedics  Contact dermatitis due to other agent, unspecified contact dermatitis type Get non latex gloves, if not better follow up, can put on OTC hydrocortisone -     dexamethasone (DECADRON) injection 10 mg; Inject 1 mL (10 mg total) into the muscle once.  Other orders -     rizatriptan (MAXALT) 10 MG tablet; Take 1 tablet (10 mg total) by mouth once as needed for migraine. May repeat in 2 hours if needed

## 2017-02-26 ENCOUNTER — Other Ambulatory Visit: Payer: Self-pay | Admitting: Physician Assistant

## 2017-02-26 ENCOUNTER — Other Ambulatory Visit: Payer: Self-pay | Admitting: Internal Medicine

## 2017-02-26 DIAGNOSIS — R51 Headache: Principal | ICD-10-CM

## 2017-02-26 DIAGNOSIS — F411 Generalized anxiety disorder: Secondary | ICD-10-CM

## 2017-02-26 DIAGNOSIS — R519 Headache, unspecified: Secondary | ICD-10-CM

## 2017-03-04 ENCOUNTER — Encounter: Payer: Self-pay | Admitting: Physician Assistant

## 2017-03-04 ENCOUNTER — Other Ambulatory Visit: Payer: Self-pay | Admitting: Physician Assistant

## 2017-03-04 MED ORDER — PERMETHRIN 5 % EX CREA: 1.0000 "application " | TOPICAL_CREAM | Freq: Once | CUTANEOUS | 1 refills | Status: AC

## 2017-03-07 ENCOUNTER — Encounter: Payer: Self-pay | Admitting: Physician Assistant

## 2017-03-10 ENCOUNTER — Other Ambulatory Visit: Payer: Self-pay | Admitting: Physician Assistant

## 2017-03-18 ENCOUNTER — Encounter: Payer: Self-pay | Admitting: Internal Medicine

## 2017-03-18 ENCOUNTER — Ambulatory Visit (INDEPENDENT_AMBULATORY_CARE_PROVIDER_SITE_OTHER): Payer: BLUE CROSS/BLUE SHIELD | Admitting: Internal Medicine

## 2017-03-18 VITALS — BP 120/80 | HR 74 | Temp 97.5°F | Resp 16 | Ht 63.0 in | Wt 215.6 lb

## 2017-03-18 DIAGNOSIS — Z207 Contact with and (suspected) exposure to pediculosis, acariasis and other infestations: Secondary | ICD-10-CM

## 2017-03-18 DIAGNOSIS — Z2089 Contact with and (suspected) exposure to other communicable diseases: Secondary | ICD-10-CM | POA: Diagnosis not present

## 2017-03-18 MED ORDER — IVERMECTIN 3 MG PO TABS: ORAL_TABLET | ORAL | 0 refills | Status: AC

## 2017-03-18 NOTE — Progress Notes (Signed)
Subjective:    Patient ID: Eileen Rios, female    DOB: September 19, 1986, 31 y.o.   MRN: [id number]  HPI  This nice 31 yo MWF's husband was treated for Scabies  2 weeks ago . Since then she was treated empirically with permethrin cream. He did her due diligence washing all clothes and bedding with hot washes. Despite this she has developed a few scattered pruritic bumps of the trunk & extremities.   Medication Sig  . ALPRAZolam (XANAX) 1 MG tablet TAKE 1/2 TO 1 TABLET TWICE DAILY AS NEEDED FOR ANXIETY  . amphetamine-dextroamphetamine (ADDERALL) 10 MG tablet Take 1 tablet (10 mg total) by mouth 2 (two) times daily with a meal.  . atenolol (TENORMIN) 50 MG tablet TAKE ONE TABLET AT BEDTIME  . azelastine (OPTIVAR) 0.05 % ophthalmic solution Place 1 drop into both eyes 2 (two) times daily. (Patient taking differently: Place 1 drop into both eyes 2 (two) times daily as needed. )  . Biotin 10 MG TABS Take by mouth daily.  . cetirizine (ZYRTEC) 10 MG tablet Take 10 mg by mouth daily as needed for allergies.  . Chlorphen-PE-Acetaminophen 4-10-325 MG TABS Take 1 tablet by mouth 3 (three) times daily as needed.  . Cholecalciferol (VITAMIN D3) 5000 units TABS Take 5,000 Units by mouth daily.   . clindamycin (CLINDAGEL) 1 % gel Apply 1 application topically 2 (two) times daily.  Marland Kitchen co-enzyme Q-10 30 MG capsule Take 30 mg by mouth daily.  Marland Kitchen Hyoscyamine Sulfate SL (LEVSIN/SL) 0.125 MG SUBL Place 0.125 mg under the tongue every 4 (four) hours as needed.  . metoCLOPramide (REGLAN) 10 MG tablet Take at onset of migraine. Can take it every 6 hours if needed.  . montelukast (SINGULAIR) 10 MG tablet Take 1 tablet (10 mg total) by mouth at bedtime.  . Norethindrone Acetate-Ethinyl Estrad-FE (LOESTRIN 24 FE) 1-20 MG-MCG(24) tablet   . nystatin (MYCOSTATIN/NYSTOP) powder APPLY DAILY AFTER A SHOWER AS NEEDED  . omeprazole (PRILOSEC) 40 MG capsule TAKE ONE CAPSULE EACH DAY  . ondansetron (ZOFRAN) 4 MG tablet Take 1 tablet  (4 mg total) by mouth daily as needed for nausea or vomiting.  . Prenatal MV-Min-Fe Fum-FA-DHA (PRENATAL 1 PO) Take by mouth.  Marland Kitchen PROCTOZONE-HC 2.5 % rectal cream APPLY RECTALLY TWICE DAILY AS NEEDED AFTER BOWEL MOVEMENT  . ranitidine (ZANTAC) 300 MG tablet TAKE ONE TABLET AT BEDTIME  . rizatriptan (MAXALT) 10 MG tablet Take 1 tablet (10 mg total) by mouth once as needed for migraine. May repeat in 2 hours if needed  . SUMAtriptan (IMITREX) 6 MG/0.5ML SOLN injection 0.60ml subQ for headache. May repeat in 2 hrs if headache persists, no more than 2 shots in 24 hrs.  . venlafaxine XR (EFFEXOR-XR) 75 MG 24 hr capsule TAKE ONE CAPSULE EACH DAY  . vitamin B-12 (CYANOCOBALAMIN) 1000 MCG tablet Take 1,000 mcg by mouth every other day.   No Known Allergies   Past Medical History:  Diagnosis Date  . Anxiety   . Depression   . Headache(784.0)    migraines  . Hypertension   . IBS (irritable bowel syndrome)   . Insulin resistance   . Seasonal allergies   . Urticaria    Review of Systems  10 point systems review negative except as above.    Objective:   Physical Exam   BP 120/80   Pulse 74   Temp 97.5 F (36.4 C)   Resp 16   Ht 5\' 3"  (1.6 m)   Wt 215 lb  9.6 oz (97.8 kg)   BMI 38.19 kg/m   Skin exam finds a few sl raised & excoriated 3-4 mm lesions of the trunk & extremities. No signs of cellulitis.    Assessment & Plan:   1. Scabies exposure  - ivermectin (STROMECTOL) 3 MG TABS tablet; Take 7 tablets now & repeat in 1 week  Dispense: 14 tablet; Refill: 0  - discussed isolation techniques

## 2017-03-18 NOTE — Patient Instructions (Addendum)
Scabies, Adult  Scabies is a skin condition that happens when very small insects get under the skin (infestation). This causes a rash and severe itchiness. Scabies can spread from person to person (is contagious). If you get scabies, it is common for others in your household to get scabies too.  With proper treatment, symptoms usually go away in 2-4 weeks. Scabies usually does not cause lasting problems.  What are the causes?  This condition is caused by mites (Sarcoptes scabiei, or human itch mites) that can only be seen with a microscope. The mites get into the top layer of skin and lay eggs. Scabies can spread from person to person through:  · Close contact with a person who has scabies.  · Contact with infested items, such as towels, bedding, or clothing.     What increases the risk?  This condition is more likely to develop in:  · People who live in nursing homes and other extended-care facilities.  · People who have sexual contact with a partner who has scabies.  · Young children who attend child care facilities.  · People who care for others who are at increased risk for scabies.     What are the signs or symptoms?  Symptoms of this condition may include:  · Severe itchiness. This is often worse at night.  · A rash that includes tiny red bumps or blisters. The rash commonly occurs on the wrist, elbow, armpit, fingers, waist, groin, or buttocks. Bumps may form a line (burrow) in some areas.  · Skin irritation. This can include scaly patches or sores.     How is this diagnosed?  This condition is diagnosed with a physical exam. Your health care provider will look closely at your skin. In some cases, your health care provider may take a sample of your affected skin (skin scraping) and have it examined under a microscope.  How is this treated?  This condition may be treated with:  · Medicated cream or lotion that kills the mites. This is spread on the entire body and left on for several hours. Usually, one  treatment with medicated cream or lotion is enough to kill all of the mites. In severe cases, the treatment may be repeated.  · Medicated cream that relieves itching.  · Medicines that help to relieve itching.  · Medicines that kill the mites. This treatment is rarely used.     Follow these instructions at home:       Medicines   · Take or apply over-the-counter and prescription medicines as told by your health care provider.  · Apply medicated cream or lotion as told by your health care provider.  · Do not wash off the medicated cream or lotion until the necessary amount of time has passed.  Skin Care   · Avoid scratching your affected skin.  · Keep your fingernails closely trimmed to reduce injury from scratching.  · Take cool baths or apply cool washcloths to help reduce itching.  General instructions   · Clean all items that you recently had contact with, including bedding, clothing, and furniture. Do this on the same day that your treatment starts.  ? Use hot water when you wash items.  ? Place unwashable items into closed, airtight plastic bags for at least 3 days. The mites cannot live for more than 3 days away from human skin.  ? Vacuum furniture and mattresses that you use.  · Make sure that other people who may have been infested are examined   by a health care provider. These include members of your household and anyone who may have had contact with infested items.  · Keep all follow-up visits as told by your health care provider. This is important.  Contact a health care provider if:  · You have itching that does not go away after 4 weeks of treatment.  · You continue to develop new bumps or burrows.  · You have redness, swelling, or pain in your rash area after treatment.  · You have fluid, blood, or pus coming from your rash.

## 2017-03-30 ENCOUNTER — Other Ambulatory Visit: Payer: Self-pay | Admitting: Allergy and Immunology

## 2017-03-31 ENCOUNTER — Encounter: Payer: Self-pay | Admitting: Physician Assistant

## 2017-03-31 ENCOUNTER — Ambulatory Visit (INDEPENDENT_AMBULATORY_CARE_PROVIDER_SITE_OTHER): Payer: BLUE CROSS/BLUE SHIELD | Admitting: Orthopaedic Surgery

## 2017-03-31 ENCOUNTER — Encounter (INDEPENDENT_AMBULATORY_CARE_PROVIDER_SITE_OTHER): Payer: Self-pay

## 2017-04-01 ENCOUNTER — Encounter: Payer: Self-pay | Admitting: Physician Assistant

## 2017-04-01 ENCOUNTER — Ambulatory Visit (INDEPENDENT_AMBULATORY_CARE_PROVIDER_SITE_OTHER): Payer: BLUE CROSS/BLUE SHIELD | Admitting: Physician Assistant

## 2017-04-01 VITALS — BP 120/90 | HR 103 | Temp 97.3°F | Resp 14 | Ht 63.0 in | Wt 213.8 lb

## 2017-04-01 DIAGNOSIS — J329 Chronic sinusitis, unspecified: Secondary | ICD-10-CM | POA: Diagnosis not present

## 2017-04-01 DIAGNOSIS — G43809 Other migraine, not intractable, without status migrainosus: Secondary | ICD-10-CM

## 2017-04-01 MED ORDER — AMOXICILLIN 500 MG PO TABS: 500.0000 mg | ORAL_TABLET | Freq: Three times a day (TID) | ORAL | 0 refills | Status: DC

## 2017-04-01 MED ORDER — DEXAMETHASONE SODIUM PHOSPHATE 100 MG/10ML IJ SOLN
10.0000 mg | Freq: Once | INTRAMUSCULAR | Status: AC
  Administered 2017-04-01: 10 mg via INTRAMUSCULAR

## 2017-04-01 MED ORDER — PREDNISONE 20 MG PO TABS: ORAL_TABLET | ORAL | 0 refills | Status: DC

## 2017-04-01 NOTE — Progress Notes (Signed)
Subjective:    Patient ID: Eileen ScheuermannSamantha Rios, female    DOB: 07-20-1986, 31 y.o.   MRN: [id number]  HPI 31 y.o. WF with history of chronic sinusitis and migraines presents with sinus pain.  Has sore throat, sinus pain, sinus congestion, no fever chills, no cough, SOB, CP.   Blood pressure 120/90, pulse (!) 103, temperature 97.3 F (36.3 C), resp. rate 14, height 5\' 3"  (1.6 m), weight 213 lb 12.8 oz (97 kg), SpO2 97 %.  Medications Current Outpatient Prescriptions on File Prior to Visit  Medication Sig  . ALPRAZolam (XANAX) 1 MG tablet TAKE 1/2 TO 1 TABLET TWICE DAILY AS NEEDED FOR ANXIETY  . amphetamine-dextroamphetamine (ADDERALL) 10 MG tablet Take 1 tablet (10 mg total) by mouth 2 (two) times daily with a meal.  . atenolol (TENORMIN) 50 MG tablet TAKE ONE TABLET AT BEDTIME  . azelastine (OPTIVAR) 0.05 % ophthalmic solution Place 1 drop into both eyes 2 (two) times daily. (Patient taking differently: Place 1 drop into both eyes 2 (two) times daily as needed. )  . Biotin 10 MG TABS Take by mouth daily.  . cetirizine (ZYRTEC) 10 MG tablet Take 10 mg by mouth daily as needed for allergies.  . Chlorphen-PE-Acetaminophen 4-10-325 MG TABS Take 1 tablet by mouth 3 (three) times daily as needed.  . Cholecalciferol (VITAMIN D3) 5000 units TABS Take 5,000 Units by mouth daily.   . clindamycin (CLINDAGEL) 1 % gel Apply 1 application topically 2 (two) times daily.  Marland Kitchen. co-enzyme Q-10 30 MG capsule Take 30 mg by mouth daily.  Marland Kitchen. Hyoscyamine Sulfate SL (LEVSIN/SL) 0.125 MG SUBL Place 0.125 mg under the tongue every 4 (four) hours as needed.  . metoCLOPramide (REGLAN) 10 MG tablet Take at onset of migraine. Can take it every 6 hours if needed.  . montelukast (SINGULAIR) 10 MG tablet TAKE ONE TABLET AT BEDTIME  . Norethindrone Acetate-Ethinyl Estrad-FE (LOESTRIN 24 FE) 1-20 MG-MCG(24) tablet   . nystatin (MYCOSTATIN/NYSTOP) powder APPLY DAILY AFTER A SHOWER AS NEEDED  . omeprazole (PRILOSEC) 40 MG capsule  TAKE ONE CAPSULE EACH DAY  . ondansetron (ZOFRAN) 4 MG tablet Take 1 tablet (4 mg total) by mouth daily as needed for nausea or vomiting.  . Prenatal MV-Min-Fe Fum-FA-DHA (PRENATAL 1 PO) Take by mouth.  Marland Kitchen. PROCTOZONE-HC 2.5 % rectal cream APPLY RECTALLY TWICE DAILY AS NEEDED AFTER BOWEL MOVEMENT  . ranitidine (ZANTAC) 300 MG tablet TAKE ONE TABLET AT BEDTIME  . rizatriptan (MAXALT) 10 MG tablet Take 1 tablet (10 mg total) by mouth once as needed for migraine. May repeat in 2 hours if needed  . SUMAtriptan (IMITREX) 6 MG/0.5ML SOLN injection 0.285ml subQ for headache. May repeat in 2 hrs if headache persists, no more than 2 shots in 24 hrs.  . venlafaxine XR (EFFEXOR-XR) 75 MG 24 hr capsule TAKE ONE CAPSULE EACH DAY  . vitamin B-12 (CYANOCOBALAMIN) 1000 MCG tablet Take 1,000 mcg by mouth every other day.  . [DISCONTINUED] Drospirenone-Ethinyl Estradiol-Levomefol (SAFYRAL) 3-0.03-0.451 MG tablet Take 1 tablet by mouth daily.   No current facility-administered medications on file prior to visit.     Problem list She has Sinusitis, chronic; Allergic rhinitis; IBS; FEMALE INFERTILITY; ACNE VULGARIS; Fatigue; Malar rash; Migraines; Gestational hypertension; Obesity (BMI 30-39.9); Generalized anxiety disorder; and Insomnia on her problem list.  Review of Systems  Constitutional: Negative for chills and diaphoresis.  HENT: Positive for congestion, postnasal drip, sinus pressure and sneezing. Negative for ear pain and sore throat.   Respiratory: Positive  for cough. Negative for chest tightness, shortness of breath and wheezing.   Cardiovascular: Negative.   Gastrointestinal: Negative.   Genitourinary: Negative.   Musculoskeletal: Negative for neck pain.  Neurological: Positive for headaches.       Objective:   Physical Exam  Constitutional: She appears well-developed and well-nourished.  HENT:  Head: Normocephalic and atraumatic.  Right Ear: External ear normal.  Nose: Right sinus exhibits  maxillary sinus tenderness. Right sinus exhibits no frontal sinus tenderness. Left sinus exhibits maxillary sinus tenderness. Left sinus exhibits no frontal sinus tenderness.  Eyes: Conjunctivae and EOM are normal.  Neck: Normal range of motion. Neck supple.  Cardiovascular: Normal rate, regular rhythm, normal heart sounds and intact distal pulses.   Pulmonary/Chest: Effort normal and breath sounds normal. No respiratory distress. She has no wheezes.  Abdominal: Soft. Bowel sounds are normal.  Lymphadenopathy:    She has cervical adenopathy.  Skin: Skin is warm and dry.      Assessment & Plan:  1. Other migraine without status migrainosus, not intractable - dexamethasone (DECADRON) injection 10 mg; Inject 1 mL (10 mg total) into the muscle once. - predniSONE (DELTASONE) 20 MG tablet; 2 tablets daily for 3 days, 1 tablet daily for 4 days.  Dispense: 10 tablet; Refill: 0  2. Chronic sinusitis, unspecified location - dexamethasone (DECADRON) injection 10 mg; Inject 1 mL (10 mg total) into the muscle once. - amoxicillin (AMOXIL) 500 MG tablet; Take 1 tablet (500 mg total) by mouth 3 (three) times daily. 10 days  Dispense: 30 tablet; Refill: 0 - predniSONE (DELTASONE) 20 MG tablet; 2 tablets daily for 3 days, 1 tablet daily for 4 days.  Dispense: 10 tablet; Refill: 0

## 2017-04-14 ENCOUNTER — Ambulatory Visit (INDEPENDENT_AMBULATORY_CARE_PROVIDER_SITE_OTHER): Payer: BLUE CROSS/BLUE SHIELD | Admitting: Orthopaedic Surgery

## 2017-04-16 ENCOUNTER — Encounter: Payer: Self-pay | Admitting: Physician Assistant

## 2017-04-21 NOTE — Progress Notes (Signed)
3 MONTH OV  Assessment and Plan:  Pregnancy-induced hypertension in second trimester Continue atenolol for now, patient is interested in pursuing another child in August Discussed need weight loss and to switch or discontinue many of her medication before that time Will switch to labetolol  Obesity (BMI 30-39.9) WEIGHT LOSS BEFORE PREGNANCY DISCUSSED  Other migraine without status migrainosus, not intractable will switch to zoloft  Generalized anxiety disorderen Continue effexor for now, switch to prozac   Medication management -     CBC with Differential/Platelet -     BASIC METABOLIC PANEL WITH GFR -     Hepatic function panel -     Magnesium  Hyperlipidemia, unspecified hyperlipidemia type -     Lipid panel  .WILL TRY TO GET OFF SEVERAL MEDICATIONS DUE TO WANTING TO GET PREGNANT Discussed med's effects and SE's. Screening labs and tests as requested with regular follow-up as recommended. Over 30 minutes of exam, counseling, chart review and critical decision making was performed  HPI  This very nice 31 y.o.female presents for follow up for obesity, migraines, chol, recurrent sinusitis.   Her blood pressure has been controlled at home, today their BP is BP: 118/76  She does not workout. She denies chest pain, shortness of breath, dizziness.  She is not on cholesterol medication and denies myalgias. Her cholesterol is at goal. The cholesterol last visit was:   Lab Results  Component Value Date   CHOL 186 11/05/2016   HDL 44 (L) 11/05/2016   LDLCALC 79 11/05/2016   TRIG 315 (H) 11/05/2016   CHOLHDL 4.2 11/05/2016   Last Z6XA1C in the office was:  Lab Results  Component Value Date   HGBA1C 5.0 11/05/2016   Finally, patient has history of Vitamin D Deficiency and last vitamin D was  Lab Results  Component Value Date   VD25OH 38 11/05/2016    She has repeated sinus infections, follows with ENT.  She has had a negative sleep study other than she was not hitting REM  sleep, she was tried on nuvigil but could not tolerate it, and has been on adderall that has helped.  She has history of migraines, doing well on effexor and with new job.  Andres Egeden will be 5 in August and she may want to try again for another baby in fall, we will have to take her off many of her medicaitons BMI is Body mass index is 37.52 kg/m., she is working on diet and exercise. Wt Readings from Last 3 Encounters:  04/22/17 211 lb 12.8 oz (96.1 kg)  04/01/17 213 lb 12.8 oz (97 kg)  03/18/17 215 lb 9.6 oz (97.8 kg)     Current Medications:  Current Outpatient Prescriptions on File Prior to Visit  Medication Sig  . ALPRAZolam (XANAX) 1 MG tablet TAKE 1/2 TO 1 TABLET TWICE DAILY AS NEEDED FOR ANXIETY  . amphetamine-dextroamphetamine (ADDERALL) 10 MG tablet Take 1 tablet (10 mg total) by mouth 2 (two) times daily with a meal.  . atenolol (TENORMIN) 50 MG tablet TAKE ONE TABLET AT BEDTIME  . azelastine (OPTIVAR) 0.05 % ophthalmic solution Place 1 drop into both eyes 2 (two) times daily. (Patient taking differently: Place 1 drop into both eyes 2 (two) times daily as needed. )  . Biotin 10 MG TABS Take by mouth daily.  . cetirizine (ZYRTEC) 10 MG tablet Take 10 mg by mouth daily as needed for allergies.  . Chlorphen-PE-Acetaminophen 4-10-325 MG TABS Take 1 tablet by mouth 3 (three) times daily  as needed.  . Cholecalciferol (VITAMIN D3) 5000 units TABS Take 5,000 Units by mouth daily.   . clindamycin (CLINDAGEL) 1 % gel Apply 1 application topically 2 (two) times daily.  Marland Kitchen co-enzyme Q-10 30 MG capsule Take 30 mg by mouth daily.  Marland Kitchen Hyoscyamine Sulfate SL (LEVSIN/SL) 0.125 MG SUBL Place 0.125 mg under the tongue every 4 (four) hours as needed.  . metoCLOPramide (REGLAN) 10 MG tablet Take at onset of migraine. Can take it every 6 hours if needed.  . montelukast (SINGULAIR) 10 MG tablet TAKE ONE TABLET AT BEDTIME  . Norethindrone Acetate-Ethinyl Estrad-FE (LOESTRIN 24 FE) 1-20 MG-MCG(24) tablet   .  nystatin (MYCOSTATIN/NYSTOP) powder APPLY DAILY AFTER A SHOWER AS NEEDED  . omeprazole (PRILOSEC) 40 MG capsule TAKE ONE CAPSULE EACH DAY  . ondansetron (ZOFRAN) 4 MG tablet Take 1 tablet (4 mg total) by mouth daily as needed for nausea or vomiting.  . predniSONE (DELTASONE) 20 MG tablet 2 tablets daily for 3 days, 1 tablet daily for 4 days.  . Prenatal MV-Min-Fe Fum-FA-DHA (PRENATAL 1 PO) Take by mouth.  Marland Kitchen PROCTOZONE-HC 2.5 % rectal cream APPLY RECTALLY TWICE DAILY AS NEEDED AFTER BOWEL MOVEMENT  . ranitidine (ZANTAC) 300 MG tablet TAKE ONE TABLET AT BEDTIME  . rizatriptan (MAXALT) 10 MG tablet Take 1 tablet (10 mg total) by mouth once as needed for migraine. May repeat in 2 hours if needed  . SUMAtriptan (IMITREX) 6 MG/0.5ML SOLN injection 0.53ml subQ for headache. May repeat in 2 hrs if headache persists, no more than 2 shots in 24 hrs.  . venlafaxine XR (EFFEXOR-XR) 75 MG 24 hr capsule TAKE ONE CAPSULE EACH DAY  . vitamin B-12 (CYANOCOBALAMIN) 1000 MCG tablet Take 1,000 mcg by mouth every other day.  . [DISCONTINUED] Drospirenone-Ethinyl Estradiol-Levomefol (SAFYRAL) 3-0.03-0.451 MG tablet Take 1 tablet by mouth daily.   No current facility-administered medications on file prior to visit.     Allergies: No Known Allergies Medical History:  has Sinusitis, chronic; Allergic rhinitis; IBS; FEMALE INFERTILITY; ACNE VULGARIS; Fatigue; Malar rash; Migraines; Gestational hypertension; Obesity (BMI 30-39.9); Generalized anxiety disorder; and Insomnia on her problem list. Surgical History:  She  has a past surgical history that includes Wisdom tooth extraction and Cesarean section (N/A, 06/05/2013). Family History:  Her family history includes Diabetes in her mother; Hypertension in her father. Social History:   reports that she has never smoked. She has never used smokeless tobacco. She reports that she drinks alcohol. She reports that she does not use drugs.  Review of Systems: Review of  Systems  Constitutional: Negative.   HENT: Negative.   Eyes: Negative.   Respiratory: Negative.   Cardiovascular: Negative.   Gastrointestinal: Negative.   Genitourinary: Negative.   Musculoskeletal: Negative.   Skin: Negative.   Neurological: Negative.   Endo/Heme/Allergies: Negative.   Psychiatric/Behavioral: Negative.     Physical Exam: Estimated body mass index is 37.52 kg/m as calculated from the following:   Height as of this encounter: 5\' 3"  (1.6 m).   Weight as of this encounter: 211 lb 12.8 oz (96.1 kg). BP 118/76   Pulse 82   Temp (!) 97.5 F (36.4 C)   Resp 16   Ht 5\' 3"  (1.6 m)   Wt 211 lb 12.8 oz (96.1 kg)   SpO2 98%   BMI 37.52 kg/m  General Appearance: Well nourished, in no apparent distress.  Eyes: PERRLA, EOMs, conjunctiva no swelling or erythema, normal fundi and vessels.  Sinuses: No Frontal/maxillary tenderness  ENT/Mouth:  Ext aud canals clear, normal light reflex with TMs without erythema, bulging. Good dentition. No erythema, swelling, or exudate on post pharynx. Tonsils not swollen or erythematous. Hearing normal.  Neck: Supple, thyroid normal. No bruits  Respiratory: Respiratory effort normal, BS equal bilaterally without rales, rhonchi, wheezing or stridor.  Cardio: RRR without murmurs, rubs or gallops. Brisk peripheral pulses without edema.  Chest: symmetric, with normal excursions and percussion.  Abdomen: Soft, nontender, no guarding, rebound, hernias, masses, or organomegaly.  Lymphatics: Non tender without lymphadenopathy.  Musculoskeletal: Full ROM all peripheral extremities,5/5 strength, and normal gait.  Skin: Warm, dry without rashes, lesions, ecchymosis. Neuro: Cranial nerves intact, reflexes equal bilaterally. Normal muscle tone, no cerebellar symptoms. Sensation intact.  Psych: Awake and oriented X 3, normal affect, Insight and Judgment appropriate.    Quentin Mulling 5:01 PM [facility]

## 2017-04-22 ENCOUNTER — Encounter: Payer: Self-pay | Admitting: Physician Assistant

## 2017-04-22 ENCOUNTER — Ambulatory Visit (INDEPENDENT_AMBULATORY_CARE_PROVIDER_SITE_OTHER): Payer: BLUE CROSS/BLUE SHIELD | Admitting: Physician Assistant

## 2017-04-22 VITALS — BP 118/76 | HR 82 | Temp 97.5°F | Resp 16 | Ht 63.0 in | Wt 211.8 lb

## 2017-04-22 DIAGNOSIS — G4700 Insomnia, unspecified: Secondary | ICD-10-CM | POA: Diagnosis not present

## 2017-04-22 DIAGNOSIS — G43809 Other migraine, not intractable, without status migrainosus: Secondary | ICD-10-CM

## 2017-04-22 DIAGNOSIS — F411 Generalized anxiety disorder: Secondary | ICD-10-CM | POA: Diagnosis not present

## 2017-04-22 DIAGNOSIS — E669 Obesity, unspecified: Secondary | ICD-10-CM | POA: Diagnosis not present

## 2017-04-22 DIAGNOSIS — O132 Gestational [pregnancy-induced] hypertension without significant proteinuria, second trimester: Secondary | ICD-10-CM | POA: Diagnosis not present

## 2017-04-22 MED ORDER — LABETALOL HCL 200 MG PO TABS: 200.0000 mg | ORAL_TABLET | Freq: Two times a day (BID) | ORAL | 2 refills | Status: DC

## 2017-04-22 NOTE — Patient Instructions (Addendum)
Stop the atenolol start the labetolol 200mg , can do 1/2 pill twice a day at first, increase to 1 pill twice a day if needed, if BP over 140/90  Start to alternate prozac and effexor every other day x 1 month and then stop the effexor and continue prozac daily for 2-4 weeks.      Bad carbs also include fruit juice, alcohol, and sweet tea. These are empty calories that do not signal to your brain that you are full.   Please remember the good carbs are still carbs which convert into sugar. So please measure them out no more than 1/2-1 cup of rice, oatmeal, pasta, and beans  Veggies are however free foods! Pile them on.   Not all fruit is created equal. Please see the list below, the fruit at the bottom is higher in sugars than the fruit at the top. Please avoid all dried fruits.

## 2017-04-25 ENCOUNTER — Encounter: Payer: Self-pay | Admitting: Physician Assistant

## 2017-04-27 ENCOUNTER — Ambulatory Visit (INDEPENDENT_AMBULATORY_CARE_PROVIDER_SITE_OTHER): Payer: BLUE CROSS/BLUE SHIELD

## 2017-04-27 ENCOUNTER — Encounter: Payer: Self-pay | Admitting: Physician Assistant

## 2017-04-27 DIAGNOSIS — G43809 Other migraine, not intractable, without status migrainosus: Secondary | ICD-10-CM | POA: Diagnosis not present

## 2017-04-27 MED ORDER — KETOROLAC TROMETHAMINE 60 MG/2ML IM SOLN
60.0000 mg | Freq: Once | INTRAMUSCULAR | Status: AC
  Administered 2017-04-27: 60 mg via INTRAMUSCULAR

## 2017-04-27 NOTE — Progress Notes (Signed)
Pt presents for 60mg  of toradol for headache Pt had no concerns other than the dull headache now at this time.

## 2017-04-29 ENCOUNTER — Telehealth: Payer: Self-pay

## 2017-04-29 NOTE — Telephone Encounter (Signed)
-----   Message from Amanda Collier, PA-C sent at 04/29/2017  2:41 PM EDT ----- Regarding: RE: med request Follow up ENT, if trying to get pregnant nothing else to do at this time.  Amanda ----- Message ----- From: Eileen Rios, CMA Sent: 04/29/2017   2:04 PM To: Amanda Collier, PA-C Subject: med request                                    Pt states she can not use flonase or any nasal spray causes headaches. She states she is already using allegra just not the zyrtec & has been using a saline solution instead of nasal spray. Please advise  

## 2017-04-29 NOTE — Telephone Encounter (Signed)
Pt states she has an appt with ENT coming up soon so she will talk to the Dr then.

## 2017-04-29 NOTE — Telephone Encounter (Signed)
-----   Message from Quentin MullingAmanda Collier, [place]PA-C sent at 04/29/2017  2:41 PM EDT ----- Regarding: RE: med request Follow up ENT, if trying to get pregnant nothing else to do at this time.  Marchelle FolksAmanda ----- Message ----- From: Gregery Nauff, Kaicee Scarpino D, CMA Sent: 04/29/2017   2:04 PM To: Quentin MullingAmanda Collier, PA-C Subject: med request                                    Pt states she can not use flonase or any nasal spray causes headaches. She states she is already using allegra just not the zyrtec & has been using a saline solution instead of nasal spray. Please advise

## 2017-05-05 ENCOUNTER — Ambulatory Visit (INDEPENDENT_AMBULATORY_CARE_PROVIDER_SITE_OTHER): Payer: BLUE CROSS/BLUE SHIELD | Admitting: Orthopaedic Surgery

## 2017-05-05 ENCOUNTER — Encounter (INDEPENDENT_AMBULATORY_CARE_PROVIDER_SITE_OTHER): Payer: Self-pay | Admitting: Orthopaedic Surgery

## 2017-05-05 VITALS — BP 111/79 | HR 76 | Ht 63.0 in | Wt 211.0 lb

## 2017-05-05 DIAGNOSIS — R200 Anesthesia of skin: Secondary | ICD-10-CM | POA: Diagnosis not present

## 2017-05-05 DIAGNOSIS — M25531 Pain in right wrist: Secondary | ICD-10-CM | POA: Diagnosis not present

## 2017-05-05 NOTE — Progress Notes (Signed)
Office Visit Note   Patient: Eileen ScheuermannSamantha Radde           Date of Birth: 01-02-1986           MRN: [id number] Visit Date: 05/05/2017              Requested by: Lucky CowboyMcKeown, William, MD [address] [place] [address] PCP: Lucky CowboyMcKeown, William, MD   Assessment & Plan: Visit Diagnoses:  1. Pain in right wrist   2. Numbness of right hand     Plan: Patient said the pain and numbness which has progressed despite bracing anti-inflammatories rest activity modification. Bothers her at home as well as at her job. Will obtain EMGs nerve conduction velocities were TUNNEL syndrome office follow-up after studies.  Follow-Up Instructions: No Follow-up on file.   Orders:  Orders Placed This Encounter  Procedures  . Ambulatory referral to Physical Medicine Rehab   No orders of the defined types were placed in this encounter.     Procedures: No procedures performed   Clinical Data: No additional findings.   Subjective: Chief Complaint  Patient presents with  . Right Wrist - Numbness  . Left Wrist - Pain    HPI 31 year old female seen with bilateral hand numbness worse on the right hand and she states she has a little bit more pain in her left hand but not so much numbness in left hand. Showed a previous appointment but had to leave before being seen due to make some other appointment. Patient's been using a wrist splint at night she states he really doesn't help. She states her fingers go numb. She has to shake her fingers. 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 Her hands above there for about 6 months she's been doing the laundry position for 3 months. She's used ibuprofen and will do both tend to bother her stomach. She denies neck pain. No numbness or tingling in her legs no weakness no problems with stairs. She is here with the 31-year-old son. She denies hand numbness problems with her pregnancy. Review of Systems is systems  positive for obesity, anxiety with fatigue, IBS, migraines, gestational hypertension. Lateral hand pain with right hand numbness. Negative for CVI, seizures, stroke. Negative for chronic anemia.   Objective: Vital Signs: BP 111/79   Pulse 76   Ht 5\' 3"  (1.6 m)   Wt 211 lb (95.7 kg)   BMI 37.38 kg/m   Physical Exam  Constitutional: She is oriented to person, place, and time. She appears well-developed.  HENT:  Head: Normocephalic.  Right Ear: External ear normal.  Left Ear: External ear normal.  Eyes: Pupils are equal, round, and reactive to light.  Neck: No tracheal deviation present. No thyromegaly present.  Cardiovascular: Normal rate.   Pulmonary/Chest: Effort normal.  Abdominal: Soft.  Musculoskeletal:  Patient has good cervical range of motion no brachial plexus tenderness negative Spurling. Upper summary reflexes are 2+ and symmetrical no median nerve tenderness in the forearm. She has pain with carpal compression on the right hand which is mild. She has some numbness in the median distribution perinephric fingers with Phalen's test. No thenar or hyperthenar atrophy. Interossei are strong profundus of my finger extensors are normal. Normal heel toe gait no lower extremity hyperreflexia.  Neurological: She is alert and oriented to person, place, and time.  Skin: Skin is warm and dry.  Psychiatric: She has a normal mood and affect. Her behavior is  normal.    Ortho Exam  Specialty Comments:  No specialty comments available.  Imaging: No results found.   PMFS History: Patient Active Problem List   Diagnosis Date Noted  . Insomnia 03/07/2015  . Generalized anxiety disorder 11/07/2014  . Obesity (BMI 30-39.9) 12/07/2013  . Gestational hypertension 05/29/2013  . Allergic rhinitis 08/20/2010  . FEMALE INFERTILITY 12/13/2009  . Malar rash 12/13/2009  . Fatigue 07/03/2009  . Sinusitis, chronic 10/24/2008  . Migraines 10/24/2008  . IBS 06/06/2008  . ACNE VULGARIS  06/06/2008   Past Medical History:  Diagnosis Date  . Anxiety   . Depression   . Headache(784.0)    migraines  . Hypertension   . IBS (irritable bowel syndrome)   . Insulin resistance   . Seasonal allergies   . Urticaria     Family History  Problem Relation Age of Onset  . Diabetes Mother   . Hypertension Father   . Migraines Neg Hx     Past Surgical History:  Procedure Laterality Date  . CESAREAN SECTION N/A 06/05/2013   Procedure: Primary cesarean section with delivery of baby boy at 1033.  Apgars 1/8.;  Surgeon: Genia DelMarie-Lyne Lavoie, MD;  Location: [facility];  Service: Obstetrics;  Laterality: N/A;  . WISDOM TOOTH EXTRACTION     Social History   Occupational History  . Not on file.   Social History Main Topics  . Smoking status: Never Smoker  . Smokeless tobacco: Never Used  . Alcohol use Yes  . Drug use: No  . Sexual activity: Yes

## 2017-05-11 ENCOUNTER — Telehealth: Payer: Self-pay | Admitting: Allergy and Immunology

## 2017-05-11 NOTE — Telephone Encounter (Signed)
Left message to return call we need to inform patient that she needs to inform us once she becomes pregnant since more medications might have to be changed at that time.

## 2017-05-11 NOTE — Telephone Encounter (Signed)
Patient is on Singulair. She is wanting to get pregnant by the end of the year and her doctor told her that this medication will not be good for her if she were pregnant. She said it works good for her, but she tried to stop taking it and her throat felt really swollen. She wants to know if there is an alternative for Singulair, if she were to become pregnant.

## 2017-05-11 NOTE — Telephone Encounter (Signed)
Spoke to patient and informed her that there is no alternative for singulair. I informed her to contact the office if she become pregnant.

## 2017-05-18 ENCOUNTER — Other Ambulatory Visit: Payer: Self-pay | Admitting: Physician Assistant

## 2017-05-18 DIAGNOSIS — A084 Viral intestinal infection, unspecified: Secondary | ICD-10-CM

## 2017-05-23 ENCOUNTER — Encounter: Payer: Self-pay | Admitting: Physician Assistant

## 2017-05-25 ENCOUNTER — Other Ambulatory Visit: Payer: Self-pay | Admitting: Physician Assistant

## 2017-05-25 DIAGNOSIS — F411 Generalized anxiety disorder: Secondary | ICD-10-CM

## 2017-05-25 MED ORDER — ALPRAZOLAM 0.5 MG PO TABS: ORAL_TABLET | ORAL | 1 refills | Status: DC

## 2017-05-25 NOTE — Progress Notes (Signed)
Xanax called into pharmacy on Aug 20th 2018 at 1:46 By DD

## 2017-06-03 ENCOUNTER — Other Ambulatory Visit: Payer: Self-pay | Admitting: Allergy and Immunology

## 2017-06-03 ENCOUNTER — Other Ambulatory Visit: Payer: Self-pay | Admitting: Physician Assistant

## 2017-06-03 MED ORDER — AMPHETAMINE-DEXTROAMPHETAMINE 10 MG PO TABS: 10.0000 mg | ORAL_TABLET | Freq: Two times a day (BID) | ORAL | 0 refills | Status: DC

## 2017-06-09 ENCOUNTER — Encounter (HOSPITAL_COMMUNITY): Payer: Self-pay | Admitting: Emergency Medicine

## 2017-06-09 ENCOUNTER — Ambulatory Visit (HOSPITAL_COMMUNITY)
Admission: EM | Admit: 2017-06-09 | Discharge: 2017-06-09 | Disposition: A | Discharge status: Home / Self Care | Payer: BLUE CROSS/BLUE SHIELD | Attending: Family Medicine | Admitting: Family Medicine

## 2017-06-09 DIAGNOSIS — Z3202 Encounter for pregnancy test, result negative: Secondary | ICD-10-CM

## 2017-06-09 DIAGNOSIS — R5383 Other fatigue: Secondary | ICD-10-CM | POA: Diagnosis not present

## 2017-06-09 DIAGNOSIS — R197 Diarrhea, unspecified: Secondary | ICD-10-CM

## 2017-06-09 LAB — POCT PREGNANCY, URINE: Preg Test, Ur: NEGATIVE

## 2017-06-09 NOTE — ED Triage Notes (Signed)
PT reports nausea, fatigue, diarrhea, palpitations that started today. No vomiting. 3-4 episodes of diarrhea today.

## 2017-06-11 ENCOUNTER — Ambulatory Visit (INDEPENDENT_AMBULATORY_CARE_PROVIDER_SITE_OTHER): Payer: BLUE CROSS/BLUE SHIELD | Admitting: Physician Assistant

## 2017-06-11 ENCOUNTER — Encounter: Payer: Self-pay | Admitting: Physician Assistant

## 2017-06-11 VITALS — BP 118/80 | HR 86 | Temp 97.9°F | Resp 16 | Ht 63.0 in | Wt 211.4 lb

## 2017-06-11 DIAGNOSIS — R5383 Other fatigue: Secondary | ICD-10-CM | POA: Diagnosis not present

## 2017-06-11 DIAGNOSIS — R0981 Nasal congestion: Secondary | ICD-10-CM

## 2017-06-11 LAB — POCT URINE PREGNANCY: Preg Test, Ur: NEGATIVE

## 2017-06-11 MED ORDER — DEXAMETHASONE SODIUM PHOSPHATE 100 MG/10ML IJ SOLN
10.0000 mg | Freq: Once | INTRAMUSCULAR | Status: AC
  Administered 2017-06-11: 10 mg via INTRAMUSCULAR

## 2017-06-11 MED ORDER — AMPHETAMINE-DEXTROAMPHETAMINE 10 MG PO TABS: 10.0000 mg | ORAL_TABLET | Freq: Two times a day (BID) | ORAL | 0 refills | Status: DC

## 2017-06-11 NOTE — Progress Notes (Signed)
Subjective:    Patient ID: Eileen Rios, female    DOB: 01-12-1986, 31 y.o.   MRN: [id number]  HPI 30 y.o. WF presents with fatigue/sinuses. She woke up 09/04 with fatigue, chest discomfort, nausea went to UC and told has virus. Still having fatigue, sleeping a lot, has sinus pressure, ear pain, has had nausea, but no more diarrhea after 1 immodium.  No fever, chills. She has been off all of her depression medications x 2 weeks trying to get pregnant.   Blood pressure 118/80, pulse 86, temperature 97.9 F (36.6 C), resp. rate 16, height  (1.6 m), weight 211 lb 6.4 oz (95.9 kg), SpO2 97 %.  Medications Current Outpatient Prescriptions on File Prior to Visit  Medication Sig  . ALPRAZolam (XANAX) 0.5 MG tablet TAKE 1/2 TO 1 TABLET TWICE DAILY AS NEEDED FOR ANXIETY  . azelastine (OPTIVAR) 0.05 % ophthalmic solution INSTILL ONE DROP IN [facility] TWICE DAILY  . Biotin 10 MG TABS Take by mouth daily.  . cetirizine (ZYRTEC) 10 MG tablet Take 10 mg by mouth daily as needed for allergies.  . Chlorphen-PE-Acetaminophen 4-10-325 MG TABS Take 1 tablet by mouth 3 (three) times daily as needed.  . Cholecalciferol (VITAMIN D3) 5000 units TABS Take 5,000 Units by mouth daily.   . clindamycin (CLINDAGEL) 1 % gel Apply 1 application topically 2 (two) times daily.  Marland Kitchen co-enzyme Q-10 30 MG capsule Take 30 mg by mouth daily.  Marland Kitchen labetalol (NORMODYNE) 200 MG tablet Take 1 tablet (200 mg total) by mouth 2 (two) times daily.  . metoCLOPramide (REGLAN) 10 MG tablet TAKE AT ONSET OF MIGRAINE- CAN TAKE IT EVERY 6 HOURS IF NEEDED  . metoCLOPramide (REGLAN) 10 MG tablet TAKE AT ONSET OF MIGRAINE- CAN TAKE IT EVERY 6 HOURS IF NEEDED  . nystatin (MYCOSTATIN/NYSTOP) powder APPLY DAILY AFTER A SHOWER AS NEEDED  . ondansetron (ZOFRAN) 4 MG tablet Take 1 tablet (4 mg total) by mouth daily as needed for nausea or vomiting.  . Prenatal MV-Min-Fe Fum-FA-DHA (PRENATAL 1 PO) Take by mouth.  Marland Kitchen PROCTOZONE-HC 2.5 % rectal  cream APPLY RECTALLY TWICE DAILY AS NEEDED AFTER BOWEL MOVEMENT  . ranitidine (ZANTAC) 300 MG tablet TAKE ONE TABLET AT BEDTIME  . SUMAtriptan (IMITREX) 6 MG/0.5ML SOLN injection 0.60ml subQ for headache. May repeat in 2 hrs if headache persists, no more than 2 shots in 24 hrs.  . vitamin B-12 (CYANOCOBALAMIN) 1000 MCG tablet Take 1,000 mcg by mouth every other day.  . [DISCONTINUED] Drospirenone-Ethinyl Estradiol-Levomefol (SAFYRAL) 3-0.03-0.451 MG tablet Take 1 tablet by mouth daily.   No current facility-administered medications on file prior to visit.     Problem list She has Sinusitis, chronic; Allergic rhinitis; IBS; FEMALE INFERTILITY; ACNE VULGARIS; Fatigue; Malar rash; Migraines; Gestational hypertension; Obesity (BMI 30-39.9); Generalized anxiety disorder; and Insomnia on her problem list.   Review of Systems  Constitutional: Positive for fatigue. Negative for chills and diaphoresis.  HENT: Positive for congestion, sinus pressure and sore throat. Negative for ear pain and sneezing.   Respiratory: Negative.  Negative for cough and shortness of breath.   Cardiovascular: Negative.   Musculoskeletal: Negative for neck pain.  Neurological: Positive for headaches. Negative for dizziness and light-headedness.       Objective:   Physical Exam  Constitutional: She appears well-developed and well-nourished.  HENT:  Head: Normocephalic and atraumatic.  Right Ear: External ear normal.  Nose: Right sinus exhibits maxillary sinus tenderness. Right sinus exhibits no frontal sinus tenderness. Left sinus  exhibits maxillary sinus tenderness. Left sinus exhibits no frontal sinus tenderness.  Eyes: Conjunctivae and EOM are normal.  Neck: Normal range of motion. Neck supple.  Cardiovascular: Normal rate, regular rhythm, normal heart sounds and intact distal pulses.   Pulmonary/Chest: Effort normal and breath sounds normal. No respiratory distress. She has no wheezes.  Abdominal: Soft. Bowel  sounds are normal.  Lymphadenopathy:    She has cervical adenopathy (right side).  Skin: Skin is warm and dry.       Assessment & Plan:   Other fatigue ? Viral syndrome, check labs, prednisone after neg preg test -     CBC with Differential/Platelet -     BASIC METABOLIC PANEL WITH GFR -     Hepatic function panel -     TSH -     Epstein-Barr virus VCA antibody panel -     POCT urine pregnancy -     dexamethasone (DECADRON) injection 10 mg; Inject 1 mL (10 mg total) into the muscle once.  Sinus congestion -     dexamethasone (DECADRON) injection 10 mg; Inject 1 mL (10 mg total) into the muscle once.  Other orders -     amphetamine-dextroamphetamine (ADDERALL) 10 MG tablet; Take 1 tablet (10 mg total) by mouth 2 (two) times daily with a meal.

## 2017-06-12 ENCOUNTER — Encounter (INDEPENDENT_AMBULATORY_CARE_PROVIDER_SITE_OTHER): Payer: Self-pay

## 2017-06-12 LAB — HEPATIC FUNCTION PANEL
AG RATIO: 1.5 (calc) (ref 1.0–2.5)
ALBUMIN MSPROF: 4.3 g/dL (ref 3.6–5.1)
ALT: 89 U/L — ABNORMAL HIGH (ref 6–29)
AST: 62 U/L — AB (ref 10–30)
Alkaline phosphatase (APISO): 94 U/L (ref 33–115)
BILIRUBIN DIRECT: 0.2 mg/dL (ref 0.0–0.2)
BILIRUBIN TOTAL: 1 mg/dL (ref 0.2–1.2)
Globulin: 2.9 g/dL (calc) (ref 1.9–3.7)
Indirect Bilirubin: 0.8 mg/dL (calc) (ref 0.2–1.2)
Total Protein: 7.2 g/dL (ref 6.1–8.1)

## 2017-06-12 LAB — CBC WITH DIFFERENTIAL/PLATELET
BASOS ABS: 43 {cells}/uL (ref 0–200)
Basophils Relative: 0.5 %
Eosinophils Absolute: 181 cells/uL (ref 15–500)
Eosinophils Relative: 2.1 %
HEMATOCRIT: 38.9 % (ref 35.0–45.0)
Hemoglobin: 13.2 g/dL (ref 11.7–15.5)
LYMPHS ABS: 1987 {cells}/uL (ref 850–3900)
MCH: 29.8 pg (ref 27.0–33.0)
MCHC: 33.9 g/dL (ref 32.0–36.0)
MCV: 87.8 fL (ref 80.0–100.0)
MPV: 10.1 fL (ref 7.5–12.5)
Monocytes Relative: 7.9 %
NEUTROS PCT: 66.4 %
Neutro Abs: 5710 cells/uL (ref 1500–7800)
Platelets: 311 10*3/uL (ref 140–400)
RBC: 4.43 10*6/uL (ref 3.80–5.10)
RDW: 13 % (ref 11.0–15.0)
Total Lymphocyte: 23.1 %
WBC: 8.6 10*3/uL (ref 3.8–10.8)
WBCMIX: 679 {cells}/uL (ref 200–950)

## 2017-06-12 LAB — BASIC METABOLIC PANEL WITH GFR
BUN: 15 mg/dL (ref 7–25)
CALCIUM: 10 mg/dL (ref 8.6–10.2)
CO2: 26 mmol/L (ref 20–32)
Chloride: 101 mmol/L (ref 98–110)
Creat: 0.93 mg/dL (ref 0.50–1.10)
GFR, EST AFRICAN AMERICAN: 95 mL/min/{1.73_m2} (ref >=60)
GFR, EST NON AFRICAN AMERICAN: 82 mL/min/{1.73_m2} (ref >=60)
Glucose, Bld: 89 mg/dL (ref 65–99)
POTASSIUM: 4.2 mmol/L (ref 3.5–5.3)
Sodium: 137 mmol/L (ref 135–146)

## 2017-06-12 LAB — EPSTEIN-BARR VIRUS VCA ANTIBODY PANEL
EBV NA IgG: 567 U/mL — ABNORMAL HIGH
EBV VCA IgG: >750 U/mL — ABNORMAL HIGH
EBV VCA IgM: <36 U/mL

## 2017-06-12 LAB — TSH: TSH: 1.08 mIU/L

## 2017-06-13 NOTE — ED Provider Notes (Signed)
  [facility]   [id number] 06/09/17 Arrival Time: 1600  ASSESSMENT & PLAN:  1. Fatigue, unspecified type   2. Diarrhea, unspecified type    Loose stools have resolved. Mild fatigue. Close observation for 24 hours. Will f/u if not continuing to improve. Reviewed expectations re: course of current medical issues. Questions answered. Outlined signs and symptoms indicating need for more acute intervention. Patient verbalized understanding. After Visit Summary given.   SUBJECTIVE:  Eileen Rios is a 31 y.o. female who presents with complaint of loose stools 3-4x starting this am. No fulminant diarrhea. No blood. Mild fatigue. No n/v. Afebrile. Isolated episode of described "feeling my heart beat fast". None since. No CP/SOB. No OTC treatment. No sick contacts or recent foreign travel. No new medications.  ROS: As per HPI.   OBJECTIVE:  Vitals:   06/09/17 1710 06/09/17 1713  BP:  104/73  Pulse:  87  Resp:  16  Temp:  98.3 F (36.8 C)  TempSrc:  Oral  SpO2:  98%  Weight: 214 lb (97.1 kg)   Height: 5\' 4"  (1.626 m)     General appearance: alert; no distress Lungs: clear to auscultation bilaterally Heart: regular rate and rhythm Abdomen: soft, non-tender; bowel sounds normal; no masses or organomegaly; no guarding or rebound tenderness Back: no CVA tenderness Extremities: no cyanosis or edema; symmetrical with no gross deformities Skin: warm and dry Neurologic: normal gait; normal symmetric reflexes Psychological: alert and cooperative; normal mood and affect  Labs: Results for orders placed or performed during the hospital encounter of 06/09/17  Pregnancy, urine POC  Result Value Ref Range   Preg Test, Ur NEGATIVE NEGATIVE   Labs Reviewed  POCT PREGNANCY, URINE    No Known Allergies  Past Medical History:  Diagnosis Date  . Anxiety   . Depression   . Headache(784.0)    migraines  . Hypertension   . IBS (irritable bowel syndrome)   . Insulin  resistance   . Seasonal allergies   . Urticaria    Social History   Social History  . Marital status: Married    Spouse name: N/A  . Number of children: N/A  . Years of education: N/A   Occupational History  . Not on file.   Social History Main Topics  . Smoking status: Never Smoker  . Smokeless tobacco: Never Used  . Alcohol use No  . Drug use: No  . Sexual activity: Yes   Other Topics Concern  . Not on file   Social History Narrative  . No narrative on file   Family History  Problem Relation Age of Onset  . Diabetes Mother   . Hypertension Father   . Migraines Neg Hx    Past Surgical History:  Procedure Laterality Date  . CESAREAN SECTION N/A 06/05/2013   Procedure: Primary cesarean section with delivery of baby boy at 1033.  Apgars 1/8.;  Surgeon: Genia DelMarie-Lyne Lavoie, MD;  Location: [facility];  Service: Obstetrics;  Laterality: N/A;  . WISDOM TOOTH EXTRACTION       Mardella LaymanHagler, Kaycee Mcgaugh, MD 06/13/17 [phone]

## 2017-06-14 ENCOUNTER — Encounter: Payer: Self-pay | Admitting: Physician Assistant

## 2017-06-17 ENCOUNTER — Encounter: Payer: Self-pay | Admitting: Physician Assistant

## 2017-06-17 MED ORDER — LEVOFLOXACIN 500 MG PO TABS: 500.0000 mg | ORAL_TABLET | Freq: Every day | ORAL | 0 refills | Status: DC

## 2017-06-25 ENCOUNTER — Encounter (INDEPENDENT_AMBULATORY_CARE_PROVIDER_SITE_OTHER): Payer: Self-pay | Admitting: Physical Medicine and Rehabilitation

## 2017-06-25 ENCOUNTER — Ambulatory Visit (INDEPENDENT_AMBULATORY_CARE_PROVIDER_SITE_OTHER): Payer: BLUE CROSS/BLUE SHIELD | Admitting: Physical Medicine and Rehabilitation

## 2017-06-25 DIAGNOSIS — M5116 Intervertebral disc disorders with radiculopathy, lumbar region: Secondary | ICD-10-CM

## 2017-06-25 NOTE — Progress Notes (Signed)
Rufina Kimery - 31 y.o. female MRN [id number]  Date of birth: June 20, 1986  Office Visit Note: Visit Date: 06/25/2017 PCP: Lucky Cowboy, MD Referred by: Lucky Cowboy, MD  Subjective: Chief Complaint  Patient presents with  . Right Hand - Numbness, Pain  . Left Hand - Numbness, Pain   HPI: Ms. Seide is a 31 year old right-hand dominant female with a history of bilateral chronic worsening hand pain with numbness and tingling. She reports worsening symptoms with activity as well as at night and with driving. She seems to get worsening symptoms in the thumb and first finger. She will occasionally get symptoms into the third and even fourth digit but then the symptoms tend to diminish. She does have more symptoms on the right than left. 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 She has failed conservative care including braces and anti-inflammatories. She has not had prior electrodiagnostic studies.    ROS Otherwise per HPI.  Assessment & Plan: Visit Diagnoses:  1. Radiculopathy due to lumbar intervertebral disc disorder     Plan: No additional findings.  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   There is no significant electrodiagnostic evidence of any other focal nerve entrapment(in particular, left median nerve), brachial plexopathy, cervical radiculopathy or generalized peripheral neuropathy.   As you know, this particular electrodiagnostic study cannot rule out chemical radiculitis or sensory only radiculopathy.  Recommendations: 1.  Follow-up with referring physician. 2.  Continue current management of symptoms. 3.  Continue use of resting splint at night-time and as needed during the day. 4.  Suggest surgical evaluation.   Meds & Orders: No orders of the defined types were placed in  this encounter.   Orders Placed This Encounter  Procedures  . NCV with EMG (electromyography)    Follow-up: Return for Dr. Ophelia Charter.   Procedures: No procedures performed  EMG & NCV Findings: Evaluation of the right median motor nerve showed reduced amplitude (4.4 mV) and decreased conduction velocity (Elbow-Wrist, 42 m/s).  The right median (across palm) sensory nerve showed prolonged distal peak latency (Wrist, 4.7 ms).  All remaining nerves (as indicated in the following tables) were within normal limits.  Left vs. Right side comparison data for the median motor nerve indicates abnormal L-R latency difference (1.4 ms).  All remaining left vs. right side differences were within normal limits.    All examined muscles (as indicated in the following table) showed no evidence of electrical instability.    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   There is no significant electrodiagnostic evidence of any other focal nerve entrapment(in particular, left median nerve), brachial plexopathy, cervical radiculopathy or generalized peripheral neuropathy.   As you know, this particular electrodiagnostic study cannot rule out chemical radiculitis or sensory only radiculopathy.  Recommendations: 1.  Follow-up with referring physician. 2.  Continue current management of symptoms. 3.  Continue use of resting splint at night-time and as needed during the day. 4.  Suggest surgical evaluation.   Nerve Conduction Studies Anti Sensory Summary Table   Stim Site NR Peak (ms) Norm Peak (ms) P-T Amp (V) Norm P-T Amp Site1 Site2 Delta-P (ms) Dist (cm) Vel (m/s) Norm Vel (m/s)  Left Median Acr Palm Anti Sensory (2nd Digit)  31.6C  Wrist    3.2 <3.6 51.9 >10 Wrist Palm 1.6 0.0    Palm    1.6 <2.0 52.9         Right Median Acr Palm Anti Sensory (2nd  Digit)  33.9C  Wrist    *4.7 <3.6 23.6 >10 Wrist Palm 2.7 0.0    Palm    2.0 <2.0 26.9         Left Radial Anti Sensory (Base 1st Digit)  34.8C  Wrist    2.1 <3.1 39.3  Wrist Base 1st Digit 2.1 0.0    Right Radial Anti Sensory (Base 1st Digit)  32.8C  Wrist    1.8 <3.1 41.5  Wrist Base 1st Digit 1.8 0.0    Left Ulnar Anti Sensory (5th Digit)  32.6C  Wrist    2.8 <3.7 45.4 >15.0 Wrist 5th Digit 2.8 14.0 50 >38  Right Ulnar Anti Sensory (5th Digit)  34C  Wrist    2.8 <3.7 41.0 >15.0 Wrist 5th Digit 2.8 14.0 50 >38   Motor Summary Table   Stim Site NR Onset (ms) Norm Onset (ms) O-P Amp (mV) Norm O-P Amp Site1 Site2 Delta-0 (ms) Dist (cm) Vel (m/s) Norm Vel (m/s)  Left Median Motor (Abd Poll Brev)  34.8C  Wrist    2.7 <4.2 7.7 >5 Elbow Wrist 3.7 19.0 51 >50  Elbow    6.4  1.9         Right Median Motor (Abd Poll Brev)  31.5C  Wrist    4.1 <4.2 *4.4 >5 Elbow Wrist 4.6 19.3 *42 >50  Elbow    8.7  3.3         Left Ulnar Motor (Abd Dig Min)  34.3C  Wrist    2.7 <4.2 9.4 >3 B Elbow Wrist 2.8 19.5 70 >53  B Elbow    5.5  9.5  A Elbow B Elbow 1.2 10.0 83 >53  A Elbow    6.7  9.3         Right Ulnar Motor (Abd Dig Min)  31.1C  Wrist    2.7 <4.2 10.1 >3 B Elbow Wrist 2.9 19.5 67 >53  B Elbow    5.6  9.7  A Elbow B Elbow 1.2 10.0 83 >53  A Elbow    6.8  9.4          EMG   Side Muscle Nerve Root Ins Act Fibs Psw Amp Dur Poly Recrt Int Dennie Bible Comment  Right Abd Poll Brev Median C8-T1 Nml Nml Nml Nml Nml 0 Nml Nml   Right 1stDorInt Ulnar C8-T1 Nml Nml Nml Nml Nml 0 Nml Nml   Right PronatorTeres Median C6-7 Nml Nml Nml Nml Nml 0 Nml Nml   Right Biceps Musculocut C5-6 Nml Nml Nml Nml Nml 0 Nml Nml   Right Deltoid Axillary C5-6 Nml Nml Nml Nml Nml 0 Nml Nml     Nerve Conduction Studies Anti Sensory Left/Right Comparison   Stim Site L Lat (ms) R Lat (ms) L-R Lat (ms) L Amp (V) R Amp (V) L-R Amp (%) Site1 Site2 L Vel (m/s) R Vel (m/s) L-R Vel (m/s)  Median Acr Palm Anti Sensory (2nd  Digit)  31.6C  Wrist 3.2 *4.7 1.5 51.9 23.6 54.5 Wrist Palm     Palm 1.6 2.0 0.4 52.9 26.9 49.1       Radial Anti Sensory (Base 1st Digit)  34.8C  Wrist 2.1 1.8 0.3 39.3 41.5 5.3 Wrist Base 1st Digit  Ulnar Anti Sensory (5th Digit)  32.6C  Wrist 2.8 2.8 0.0 45.4 41.0 9.7 Wrist 5th Digit 50 50 0   Motor Left/Right Comparison   Stim Site L Lat (ms) R Lat (ms) L-R Lat (ms) L Amp (mV) R Amp (mV) L-R Amp (%) Site1 Site2 L Vel (m/s) R Vel (m/s) L-R Vel (m/s)  Median Motor (Abd Poll Brev)  34.8C  Wrist 2.7 4.1 *1.4 7.7 *4.4 42.9 Elbow Wrist 51 *42 9  Elbow 6.4 8.7 2.3 1.9 3.3 42.4       Ulnar Motor (Abd Dig Min)  34.3C  Wrist 2.7 2.7 0.0 9.4 10.1 6.9 B Elbow Wrist 70 67 3  B Elbow 5.5 5.6 0.1 9.5 9.7 2.1 A Elbow B Elbow 83 83 0  A Elbow 6.7 6.8 0.1 9.3 9.4 1.1          Waveforms:                     Clinical History: No specialty comments available.  She reports that she has never smoked. She has never used smokeless tobacco.   Recent Labs  11/05/16 1549  HGBA1C 5.0    Objective:  VS:  HT:    WT:   BMI:     BP:   HR: bpm  TEMP: ( )  RESP:  Physical Exam  Musculoskeletal:  Inspection reveals no atrophy of the bilateral APB or FDI or hand intrinsics. There is no swelling, color changes, allodynia or dystrophic changes. There is 5 out of 5 strength in the bilateral wrist extension, finger abduction and long finger flexion. There is subjective decrease sensation to light touch in right median peripheral nerve distributions. There is a negative Hoffmann's test bilaterally.    Ortho Exam Imaging: No results found.  Past Medical/Family/Surgical/Social History: Medications & Allergies reviewed per EMR Patient Active Problem List   Diagnosis Date Noted  . Insomnia 03/07/2015  . Generalized anxiety disorder 11/07/2014  . Obesity (BMI 30-39.9) 12/07/2013  . Gestational hypertension 05/29/2013  . Allergic rhinitis 08/20/2010  . FEMALE INFERTILITY 12/13/2009  .  Malar rash 12/13/2009  . Fatigue 07/03/2009  . Sinusitis, chronic 10/24/2008  . Migraines 10/24/2008  . IBS 06/06/2008  . ACNE VULGARIS 06/06/2008   Past Medical History:  Diagnosis Date  . Anxiety   . Depression   . Headache(784.0)    migraines  . Hypertension   . IBS (irritable bowel syndrome)   . Insulin resistance   . Seasonal allergies   . Urticaria    Family History  Problem Relation Age of Onset  . Diabetes Mother   . Hypertension Father   . Migraines Neg Hx    Past Surgical History:  Procedure Laterality Date  . CESAREAN SECTION N/A 06/05/2013   Procedure: Primary cesarean section with delivery of baby boy at 1033.  Apgars 1/8.;  Surgeon: Genia Del, MD;  Location: [facility];  Service: Obstetrics;  Laterality: N/A;  . WISDOM TOOTH EXTRACTION     Social History   Occupational History  . Not on file.   Social History Main Topics  . Smoking status: Never Smoker  . Smokeless tobacco: Never Used  . Alcohol use No  . Drug use: No  . Sexual activity: Yes

## 2017-06-25 NOTE — Progress Notes (Deleted)
Numbness in hands with sleeping and driving. Also has pain in hands at night. Symptoms are worse in thumb and seems to lesson towards fifth finger. Right hand dominant.

## 2017-06-29 NOTE — Procedures (Signed)
EMG & NCV Findings: Evaluation of the right median motor nerve showed reduced amplitude (4.4 mV) and decreased conduction velocity (Elbow-Wrist, 42 m/s).  The right median (across palm) sensory nerve showed prolonged distal peak latency (Wrist, 4.7 ms).  All remaining nerves (as indicated in the following tables) were within normal limits.  Left vs. Right side comparison data for the median motor nerve indicates abnormal L-R latency difference (1.4 ms).  All remaining left vs. right side differences were within normal limits.    All examined muscles (as indicated in the following table) showed no evidence of electrical instability.    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   There is no significant electrodiagnostic evidence of any other focal nerve entrapment(in particular, left median nerve), brachial plexopathy, cervical radiculopathy or generalized peripheral neuropathy.   As you know, this particular electrodiagnostic study cannot rule out chemical radiculitis or sensory only radiculopathy.  Recommendations: 1.  Follow-up with referring physician. 2.  Continue current management of symptoms. 3.  Continue use of resting splint at night-time and as needed during the day. 4.  Suggest surgical evaluation.   Nerve Conduction Studies Anti Sensory Summary Table   Stim Site NR Peak (ms) Norm Peak (ms) P-T Amp (V) Norm P-T Amp Site1 Site2 Delta-P (ms) Dist (cm) Vel (m/s) Norm Vel (m/s)  Left Median Acr Palm Anti Sensory (2nd Digit)  31.6C  Wrist    3.2 <3.6 51.9 >10 Wrist Palm 1.6 0.0    Palm    1.6 <2.0 52.9         Right Median Acr Palm Anti Sensory (2nd Digit)  33.9C  Wrist    *4.7 <3.6 23.6 >10 Wrist Palm 2.7 0.0    Palm    2.0 <2.0 26.9         Left Radial Anti Sensory (Base 1st Digit)  34.8C  Wrist    2.1 <3.1 39.3  Wrist  Base 1st Digit 2.1 0.0    Right Radial Anti Sensory (Base 1st Digit)  32.8C  Wrist    1.8 <3.1 41.5  Wrist Base 1st Digit 1.8 0.0    Left Ulnar Anti Sensory (5th Digit)  32.6C  Wrist    2.8 <3.7 45.4 >15.0 Wrist 5th Digit 2.8 14.0 50 >38  Right Ulnar Anti Sensory (5th Digit)  34C  Wrist    2.8 <3.7 41.0 >15.0 Wrist 5th Digit 2.8 14.0 50 >38   Motor Summary Table   Stim Site NR Onset (ms) Norm Onset (ms) O-P Amp (mV) Norm O-P Amp Site1 Site2 Delta-0 (ms) Dist (cm) Vel (m/s) Norm Vel (m/s)  Left Median Motor (Abd Poll Brev)  34.8C  Wrist    2.7 <4.2 7.7 >5 Elbow Wrist 3.7 19.0 51 >50  Elbow    6.4  1.9         Right Median Motor (Abd Poll Brev)  31.5C  Wrist    4.1 <4.2 *4.4 >5 Elbow Wrist 4.6 19.3 *42 >50  Elbow    8.7  3.3         Left Ulnar Motor (Abd Dig Min)  34.3C  Wrist    2.7 <4.2 9.4 >3 B Elbow Wrist 2.8 19.5 70 >53  B Elbow    5.5  9.5  A Elbow B Elbow 1.2 10.0 83 >53  A Elbow    6.7  9.3  Right Ulnar Motor (Abd Dig Min)  31.1C  Wrist    2.7 <4.2 10.1 >3 B Elbow Wrist 2.9 19.5 67 >53  B Elbow    5.6  9.7  A Elbow B Elbow 1.2 10.0 83 >53  A Elbow    6.8  9.4          EMG   Side Muscle Nerve Root Ins Act Fibs Psw Amp Dur Poly Recrt Int Dennie Bible Comment  Right Abd Poll Brev Median C8-T1 Nml Nml Nml Nml Nml 0 Nml Nml   Right 1stDorInt Ulnar C8-T1 Nml Nml Nml Nml Nml 0 Nml Nml   Right PronatorTeres Median C6-7 Nml Nml Nml Nml Nml 0 Nml Nml   Right Biceps Musculocut C5-6 Nml Nml Nml Nml Nml 0 Nml Nml   Right Deltoid Axillary C5-6 Nml Nml Nml Nml Nml 0 Nml Nml     Nerve Conduction Studies Anti Sensory Left/Right Comparison   Stim Site L Lat (ms) R Lat (ms) L-R Lat (ms) L Amp (V) R Amp (V) L-R Amp (%) Site1 Site2 L Vel (m/s) R Vel (m/s) L-R Vel (m/s)  Median Acr Palm Anti Sensory (2nd Digit)  31.6C  Wrist 3.2 *4.7 1.5 51.9 23.6 54.5 Wrist Palm     Palm 1.6 2.0 0.4 52.9 26.9 49.1       Radial Anti Sensory (Base 1st Digit)  34.8C  Wrist 2.1 1.8 0.3 39.3 41.5 5.3  Wrist Base 1st Digit     Ulnar Anti Sensory (5th Digit)  32.6C  Wrist 2.8 2.8 0.0 45.4 41.0 9.7 Wrist 5th Digit 50 50 0   Motor Left/Right Comparison   Stim Site L Lat (ms) R Lat (ms) L-R Lat (ms) L Amp (mV) R Amp (mV) L-R Amp (%) Site1 Site2 L Vel (m/s) R Vel (m/s) L-R Vel (m/s)  Median Motor (Abd Poll Brev)  34.8C  Wrist 2.7 4.1 *1.4 7.7 *4.4 42.9 Elbow Wrist 51 *42 9  Elbow 6.4 8.7 2.3 1.9 3.3 42.4       Ulnar Motor (Abd Dig Min)  34.3C  Wrist 2.7 2.7 0.0 9.4 10.1 6.9 B Elbow Wrist 70 67 3  B Elbow 5.5 5.6 0.1 9.5 9.7 2.1 A Elbow B Elbow 83 83 0  A Elbow 6.7 6.8 0.1 9.3 9.4 1.1          Waveforms:

## 2017-07-04 ENCOUNTER — Other Ambulatory Visit: Payer: Self-pay | Admitting: Physician Assistant

## 2017-07-13 ENCOUNTER — Other Ambulatory Visit: Payer: Self-pay | Admitting: Physician Assistant

## 2017-07-13 ENCOUNTER — Encounter: Payer: Self-pay | Admitting: Adult Health

## 2017-07-13 ENCOUNTER — Encounter: Payer: Self-pay | Admitting: Physician Assistant

## 2017-07-13 ENCOUNTER — Ambulatory Visit (INDEPENDENT_AMBULATORY_CARE_PROVIDER_SITE_OTHER): Payer: BLUE CROSS/BLUE SHIELD | Admitting: Adult Health

## 2017-07-13 VITALS — BP 118/76 | HR 81 | Ht 63.0 in | Wt 216.2 lb

## 2017-07-13 DIAGNOSIS — G43009 Migraine without aura, not intractable, without status migrainosus: Secondary | ICD-10-CM | POA: Diagnosis not present

## 2017-07-13 MED ORDER — SERTRALINE HCL 50 MG PO TABS: 50.0000 mg | ORAL_TABLET | Freq: Every day | ORAL | 2 refills | Status: DC

## 2017-07-13 NOTE — Progress Notes (Signed)
PATIENT: Eileen Rios DOB: Aug 15, 1986  REASON FOR VISIT: follow up- migraine  HISTORY FROM: patient  HISTORY OF PRESENT ILLNESS: Today 07/13/17 Eileen Rios is a 31 year old female with a history of migraine headaches. She returns today for follow-up. She states that she is currently trying to conceive again. Reports that her primary care weaning her off of Effexor and Prozac. She states that she still uses the Imitrex injection if she has a severe headache. Patient states that she may have a slight headache every other day. She typically has to use the Imitrex injection 1-2 times a month. Her headaches typically occur across the forehead or around the neck. She does have photophobia, phonophobia nausea but no vomiting. She states that since she came off Effexor she has noted more anxiety and fatigue throughout the day. She reports that she does take Adderall helps some but she does notice the difference since this medication was discontinued. She returns today for an evaluation.  HISTORY 01/07/17: Eileen Rios is a 31 year old female with a history of headaches. She states that since the last visit she only had 3 migraine headaches. For these headaches she used the Imitrex injection with good benefit. She states that she has a mild headache one to 2 times a week and usually can take ibuprofen or Reglan and that offers her the benefit. She states that she remains on Effexor. Reports that she was started on this in high school for headaches and now she takes it more for headaches and mood. Overall she is doing well. She states by the end of the year she is considering having another child. She states that she will be in the process of weaning herself off of most of these medications. She still finds that she takes a nap every couple days her migraines are better as well. She returns today for an evaluation.  HISTORY 05/28/16: Eileen Rios is a 31 year old female with a history of headaches. She returns today  for follow-up. At the last visit Cymbalta was increased to 40 mg daily. She reports that she was unable to tolerate Cymbalta. Her primary care's switched her Prozac. She states this has been working better for her. She states that she has found that if she takes a 3-4 hour nap every couple days this prevents her from having migraines. She is also wondering what to do to improve her sleep hygiene at night so that would not be necessary. She denies any new neurological symptoms. He returns today for an evaluation.  HISTORY 02/19/16:Eileen Rios is a 31 year old female with a history of headaches. She returns today for follow-up. She states that she continues to have daily headaches. At the last visit her Cymbalta was increased however s he states that she was already on 30 mg. She states that she does have severe allergies and is on several medications. She feels that some of her headaches may be related to her allergies. She also states that she is under a lot of stress and has a history of obsessive-compulsive disorder. She also reports that she has a sleep disorder. 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 She has tried sleep agents but could not tolerate them. She is on Adderall to help with daytime sleepiness. She states that she only gets 1 true migraine a month. Most of her headaches are located in the temporal region. With severe headaches she does have photophobia and phonophobia and some nausea. She states that  only oxycodone will help her severe headaches. She states that she will be seeing a therapist to help her deal with her stress. She also reports that she will be seen in ophthalmologist to be evaluated for glaucoma in the right eye. She returns today for an evaluation.    REVIEW OF SYSTEMS: Out of a complete 14 system review of symptoms, the patient complains only of the following symptoms, and all other reviewed systems are negative.  Abdominal pain, diarrhea,  nausea, aching muscles, depression, nervous/anxious, headache, environmental allergies, fatigue, chest tightness, daytime sleepiness, shift work   ALLERGIES: No Known Allergies  HOME MEDICATIONS: Outpatient Medications Prior to Visit  Medication Sig Dispense Refill  . ALPRAZolam (XANAX) 0.5 MG tablet TAKE 1/2 TO 1 TABLET TWICE DAILY AS NEEDED FOR ANXIETY 60 tablet 1  . amphetamine-dextroamphetamine (ADDERALL) 10 MG tablet Take 1 tablet (10 mg total) by mouth 2 (two) times daily with a meal. 60 tablet 0  . azelastine (OPTIVAR) 0.05 % ophthalmic solution INSTILL ONE DROP IN EACH EYE TWICE DAILY 6 mL 0  . B-D INS SYR ULTRAFINE .5CC/30G 30G X 1/2" 0.5 ML MISC AS DIRECTED 90 each 1  . Biotin 10 MG TABS Take by mouth daily.    . cetirizine (ZYRTEC) 10 MG tablet Take 10 mg by mouth daily as needed for allergies.    . Chlorphen-PE-Acetaminophen 4-10-325 MG TABS Take 1 tablet by mouth 3 (three) times daily as needed. 30 tablet 0  . Cholecalciferol (VITAMIN D3) 5000 units TABS Take 5,000 Units by mouth daily.     . clindamycin (CLINDAGEL) 1 % gel Apply 1 application topically 2 (two) times daily. 30 g 2  . co-enzyme Q-10 30 MG capsule Take 30 mg by mouth daily.    Marland Kitchen labetalol (NORMODYNE) 200 MG tablet Take 1 tablet (200 mg total) by mouth 2 (two) times daily. 60 tablet 2  . metoCLOPramide (REGLAN) 10 MG tablet TAKE AT ONSET OF MIGRAINE- CAN TAKE IT EVERY 6 HOURS IF NEEDED 60 tablet 0  . metoCLOPramide (REGLAN) 10 MG tablet TAKE AT ONSET OF MIGRAINE- CAN TAKE IT EVERY 6 HOURS IF NEEDED 60 tablet 0  . nystatin (MYCOSTATIN/NYSTOP) powder APPLY DAILY AFTER A SHOWER AS NEEDED 60 g 1  . ondansetron (ZOFRAN) 4 MG tablet Take 1 tablet (4 mg total) by mouth daily as needed for nausea or vomiting. 30 tablet 1  . Prenatal MV-Min-Fe Fum-FA-DHA (PRENATAL 1 PO) Take by mouth.    Marland Kitchen PROCTOZONE-HC 2.5 % rectal cream APPLY RECTALLY TWICE DAILY AS NEEDED AFTER BOWEL MOVEMENT 30 g 0  . ranitidine (ZANTAC) 300 MG tablet  TAKE ONE TABLET AT BEDTIME 30 tablet 6  . SUMAtriptan (IMITREX) 6 MG/0.5ML SOLN injection 0.57mL SUBCUTANEOUSLY FOR HEADACHE MAY REPEAT IN 2 HOURS IF HEADACHE PERSISTS -NO MORE THEN 2 SHOTS IN 24 HOURS 8 mL 1  . vitamin B-12 (CYANOCOBALAMIN) 1000 MCG tablet Take 1,000 mcg by mouth every other day.    . levofloxacin (LEVAQUIN) 500 MG tablet Take 1 tablet (500 mg total) by mouth daily. (Patient not taking: Reported on 07/13/2017) 10 tablet 0   No facility-administered medications prior to visit.     PAST MEDICAL HISTORY: Past Medical History:  Diagnosis Date  . Anxiety   . Depression   . Headache(784.0)    migraines  . Hypertension   . IBS (irritable bowel syndrome)   . Insulin resistance   . Seasonal allergies   . Urticaria     PAST SURGICAL HISTORY: Past Surgical  History:  Procedure Laterality Date  . CESAREAN SECTION N/A 06/05/2013   Procedure: Primary cesarean section with delivery of baby boy at 1033.  Apgars 1/8.;  Surgeon: Genia Del, MD;  Location: [facility];  Service: Obstetrics;  Laterality: N/A;  . WISDOM TOOTH EXTRACTION      FAMILY HISTORY: Family History  Problem Relation Age of Onset  . Diabetes Mother   . Hypertension Father   . Migraines Neg Hx     SOCIAL HISTORY: Social History   Social History  . Marital status: Married    Spouse name: N/A  . Number of children: N/A  . Years of education: N/A   Occupational History  . Not on file.   Social History Main Topics  . Smoking status: Never Smoker  . Smokeless tobacco: Never Used  . Alcohol use No  . Drug use: No  . Sexual activity: Yes   Other Topics Concern  . Not on file   Social History Narrative  . No narrative on file      PHYSICAL EXAM  Vitals:   07/13/17 1010  BP: 118/76  Pulse: 81  Weight: 216 lb 3.2 oz (98.1 kg)  Height:  (1.6 m)   Body mass index is 38.3 kg/m.  Generalized: Well developed, in no acute distress   Neurological examination  Mentation: Alert  oriented to time, place, history taking. Follows all commands speech and language fluent Cranial nerve II-XII: Pupils were equal round reactive to light. Extraocular movements were full, visual field were full on confrontational test. Facial sensation and strength were normal. Uvula tongue midline. Head turning and shoulder shrug  were normal and symmetric. Motor: The motor testing reveals 5 over 5 strength of all 4 extremities. Good symmetric motor tone is noted throughout.  Sensory: Sensory testing is intact to soft touch on all 4 extremities. No evidence of extinction is noted.  Coordination: Cerebellar testing reveals good finger-nose-finger and heel-to-shin bilaterally.  Gait and station: Gait is normal. Tandem gait is normal. Romberg is negative. No drift is seen.   Reflexes: Deep tendon reflexes are symmetric and normal bilaterally.   DIAGNOSTIC DATA (LABS, IMAGING, TESTING) - I reviewed patient records, labs, notes, testing and imaging myself where available.  Lab Results  Component Value Date   WBC 8.6 06/11/2017   HGB 13.2 06/11/2017   HCT 38.9 06/11/2017   MCV 87.8 06/11/2017   PLT 311 06/11/2017      Component Value Date/Time   NA 137 06/11/2017 1124   K 4.2 06/11/2017 1124   CL 101 06/11/2017 1124   CO2 26 06/11/2017 1124   GLUCOSE 89 06/11/2017 1124   BUN 15 06/11/2017 1124   CREATININE 0.93 06/11/2017 1124   CALCIUM 10.0 06/11/2017 1124   PROT 7.2 06/11/2017 1124   ALBUMIN 4.2 11/05/2016 1549   AST 62 (H) 06/11/2017 1124   ALT 89 (H) 06/11/2017 1124   ALKPHOS 84 11/05/2016 1549   BILITOT 1.0 06/11/2017 1124   GFRNONAA 82 06/11/2017 1124   GFRAA 95 06/11/2017 1124   Lab Results  Component Value Date   CHOL 186 11/05/2016   HDL 44 (L) 11/05/2016   LDLCALC 79 11/05/2016   TRIG 315 (H) 11/05/2016   CHOLHDL 4.2 11/05/2016   Lab Results  Component Value Date   HGBA1C 5.0 11/05/2016   Lab Results  Component Value Date   VITAMINB12 805 11/05/2016   Lab  Results  Component Value Date   TSH 1.08 06/11/2017  ASSESSMENT AND PLAN 31 y.o. year old female  has a past medical history of Anxiety; Depression; Headache(784.0); Hypertension; IBS (irritable bowel syndrome); Insulin resistance; Seasonal allergies; and Urticaria. here with:  1. Migraine headache   The patient does not be started on any additional medication at this time. She is planning to start trying to conceive. I did explain that Imitrex is a pregnancy category C. She voiced understanding. I did explain that if her mood and fatigue did not improve within the next month she should discuss this with her primary care provider. She voiced understanding. She will follow-up in 6 months or sooner if needed.  I spent 15 minutes with the patient. 50% of this time was spent medications for headaches in preganancy      Butch Penny, MSN, NP-C 07/13/2017, 10:28 AM [facility] [address], [address] [place], [place] [address] [phone]

## 2017-07-13 NOTE — Progress Notes (Signed)
I have read the note, and I agree with the clinical assessment and plan.  WILLIS,CHARLES KEITH   

## 2017-07-13 NOTE — Patient Instructions (Signed)
Your Plan:  Continue to monitor headaches If mood and fatigue does not improve discuss with PCP. If your symptoms worsen or you develop new symptoms please let us know.    Thank you for coming to see [place] at [facility]. I hope we have been able to provide you high quality care today.  You may receive a patient satisfaction survey over the next few weeks. We would appreciate your feedback and comments so that we may continue to improve ourselves and the health of our patients.

## 2017-07-15 ENCOUNTER — Ambulatory Visit (INDEPENDENT_AMBULATORY_CARE_PROVIDER_SITE_OTHER): Payer: BLUE CROSS/BLUE SHIELD | Admitting: Orthopaedic Surgery

## 2017-07-21 ENCOUNTER — Encounter: Payer: Self-pay | Admitting: Physician Assistant

## 2017-08-04 ENCOUNTER — Ambulatory Visit (INDEPENDENT_AMBULATORY_CARE_PROVIDER_SITE_OTHER): Payer: BLUE CROSS/BLUE SHIELD

## 2017-08-04 DIAGNOSIS — N390 Urinary tract infection, site not specified: Secondary | ICD-10-CM | POA: Diagnosis not present

## 2017-08-04 NOTE — Progress Notes (Signed)
Patient presents to the office with lower abdominal pain with the feeling of pressure and lower back pain. Patient suspects she has a UTI.

## 2017-08-05 ENCOUNTER — Ambulatory Visit (INDEPENDENT_AMBULATORY_CARE_PROVIDER_SITE_OTHER): Payer: BLUE CROSS/BLUE SHIELD | Admitting: Orthopaedic Surgery

## 2017-08-05 ENCOUNTER — Encounter (INDEPENDENT_AMBULATORY_CARE_PROVIDER_SITE_OTHER): Payer: Self-pay | Admitting: Orthopaedic Surgery

## 2017-08-05 ENCOUNTER — Encounter: Payer: Self-pay | Admitting: Physician Assistant

## 2017-08-05 VITALS — BP 113/78 | HR 78 | Ht 63.0 in | Wt 215.0 lb

## 2017-08-05 DIAGNOSIS — G5601 Carpal tunnel syndrome, right upper limb: Secondary | ICD-10-CM

## 2017-08-05 LAB — URINALYSIS, COMPLETE
Bacteria, UA: NONE SEEN /HPF
Bilirubin Urine: NEGATIVE
GLUCOSE, UA: NEGATIVE
HGB URINE DIPSTICK: NEGATIVE
HYALINE CAST: NONE SEEN /LPF
Ketones, ur: NEGATIVE
Nitrite: NEGATIVE
Protein, ur: NEGATIVE
RBC / HPF: NONE SEEN /HPF (ref 0–2)
SPECIFIC GRAVITY, URINE: 1.011 (ref 1.001–1.03)

## 2017-08-05 LAB — URINE CULTURE
MICRO NUMBER:: 81216851
SPECIMEN QUALITY:: ADEQUATE

## 2017-08-05 NOTE — Progress Notes (Signed)
Office Visit Note   Patient: Eileen ScheuermannSamantha Rios           Date of Birth: [birth year]           MRN: [id number] Visit Date: 08/05/2017              Requested by: Lucky CowboyMcKeown, William, MD [address] [place] [id number] PCP: Lucky CowboyMcKeown, William, MD   Assessment & Plan: Visit Diagnoses:  1. Carpal tunnel syndrome, right upper limb     Plan: . Today options discussed including continued conservative management versus surgical intervention with carpal tunnel release. Patient states that she would like to give this more time since her symptoms or not to severe. Reviewed studies with her. We'll follow-up the office in 1 month for recheck to see if the symptoms are worsening and she is ready to proceed with surgical intervention. If she decides to have surgery sooner she can call and let us know for scheduling. Continue wrist splint when necessary.  Follow-Up Instructions: Return in about 4 weeks (around 09/02/2017).   Orders:  No orders of the defined types were placed in this encounter.  No orders of the defined types were placed in this encounter.     Procedures: No procedures performed   Clinical Data: No additional findings.   Subjective: Chief Complaint  Patient presents with  . Right Wrist - Follow-up    EMG/NCS review    HPI 31 year old female returns to review of her NCV/EMG study performed 06/25/2017. Report read electrodiagnostic study is abnormal and reveals evidence of a moderate right median nerve entrapment at the wrist (carpal Tunnel syndrome) affecting sensory and motor components. There is no significant lateral diagnostic evidence of any other focal nerve entrapment in particular left median nerve. Patient states that she still continues to get numbness and tingling in the right thumb index and long finger. Denies any weakness. Continues to have some pain in the fingers but not as bad. Symptoms worse at night.  No complaints of neck pain. Review of  Systems Current cardiac pulmonary GI GU issues.  Objective: Vital Signs: BP 113/78   Pulse 78   Ht 5\' 3"  (1.6 m)   Wt 215 lb (97.5 kg)   BMI 38.09 kg/m   Physical Exam  Constitutional: She is oriented to person, place, and time. She appears well-developed. No distress.  HENT:  Head: Normocephalic and atraumatic.  Eyes: Pupils are equal, round, and reactive to light. EOM are normal.  Neck: Normal range of motion.  Mild right brachial plexus tenderness.  Pulmonary/Chest: No respiratory distress.  Musculoskeletal:  Bilateral wrist good range of motion. Positive right greater left Tinel's. Positive right Phalen's. Maybe trace right thenar atrophy. No grip weakness. Bilateral elbows good range of motion. Negative Tinel's bilateral cubital tunnels.  Neurological: She is alert and oriented to person, place, and time.  Skin: Skin is warm and dry.  Psychiatric: She has a normal mood and affect.    Ortho Exam  Specialty Comments:  No specialty comments available.  Imaging: No results found.   PMFS History: Patient Active Problem List   Diagnosis Date Noted  . Insomnia 03/07/2015  . Generalized anxiety disorder 11/07/2014  . Obesity (BMI 30-39.9) 12/07/2013  . Gestational hypertension 05/29/2013  . Allergic rhinitis 08/20/2010  . FEMALE INFERTILITY 12/13/2009  . Malar rash 12/13/2009  . Fatigue 07/03/2009  . Sinusitis, chronic 10/24/2008  . Migraines 10/24/2008  . IBS 06/06/2008  . ACNE VULGARIS 06/06/2008   Past Medical History:  Diagnosis Date  . Anxiety   . Depression   . Headache(784.0)    migraines  . Hypertension   . IBS (irritable bowel syndrome)   . Insulin resistance   . Seasonal allergies   . Urticaria     Family History  Problem Relation Age of Onset  . Diabetes Mother   . Hypertension Father   . Migraines Neg Hx     Past Surgical History:  Procedure Laterality Date  . CESAREAN SECTION N/A 06/05/2013   Procedure: Primary cesarean section with  delivery of baby boy at 1033.  Apgars 1/8.;  Surgeon: Genia Del, MD;  Location: [facility];  Service: Obstetrics;  Laterality: N/A;  . WISDOM TOOTH EXTRACTION     Social History   Occupational History  . Not on file.   Social History Main Topics  . Smoking status: Never Smoker  . Smokeless tobacco: Never Used  . Alcohol use No  . Drug use: No  . Sexual activity: Yes

## 2017-08-07 ENCOUNTER — Other Ambulatory Visit: Payer: Self-pay | Admitting: Allergy and Immunology

## 2017-08-12 ENCOUNTER — Encounter: Payer: Self-pay | Admitting: Physician Assistant

## 2017-08-12 ENCOUNTER — Ambulatory Visit: Payer: BLUE CROSS/BLUE SHIELD | Admitting: Physician Assistant

## 2017-08-12 VITALS — BP 110/80 | Temp 98.6°F | Resp 14 | Ht 63.0 in | Wt 211.2 lb

## 2017-08-12 DIAGNOSIS — N898 Other specified noninflammatory disorders of vagina: Secondary | ICD-10-CM

## 2017-08-12 DIAGNOSIS — R102 Pelvic and perineal pain: Secondary | ICD-10-CM | POA: Diagnosis not present

## 2017-08-12 DIAGNOSIS — Z23 Encounter for immunization: Secondary | ICD-10-CM

## 2017-08-12 LAB — POCT URINE PREGNANCY: Preg Test, Ur: NEGATIVE

## 2017-08-12 NOTE — Progress Notes (Signed)
Subjective:    Patient ID: Eileen Rios, female    DOB: Jan 07, 1986, 31 y.o.   MRN: [id number]  HPI 31 y.o. WF presents with suprapubic discomfort x 1-2 weeks. Had negative urine on 10/30 that was negative. Achy intermittent pain, some discomfort in the AM with urination, had menses 10/20 and is due again on the 20th, she is off BCP. Married, has not had intercourse since last menses. No fever, chills, no vaginal discharge. Has appt with OB Monday.   Blood pressure 110/80, temperature 98.6 F (37 C), resp. rate 14, height 5\' 3"  (1.6 m), weight 211 lb 3.2 oz (95.8 kg), last menstrual period 07/26/2017.  Medications Current Outpatient Medications on File Prior to Visit  Medication Sig  . ALPRAZolam (XANAX) 0.5 MG tablet TAKE 1/2 TO 1 TABLET TWICE DAILY AS NEEDED FOR ANXIETY  . amphetamine-dextroamphetamine (ADDERALL) 10 MG tablet Take 1 tablet (10 mg total) by mouth 2 (two) times daily with a meal.  . azelastine (OPTIVAR) 0.05 % ophthalmic solution [facility]  . B-D INS SYR ULTRAFINE .5CC/30G 30G X 1/2" 0.5 ML MISC AS DIRECTED  . cetirizine (ZYRTEC) 10 MG tablet Take 10 mg by mouth daily as needed for allergies.  . Chlorphen-PE-Acetaminophen 4-10-325 MG TABS Take 1 tablet by mouth 3 (three) times daily as needed.  . clindamycin (CLINDAGEL) 1 % gel Apply 1 application topically 2 (two) times daily.  Marland Kitchen. labetalol (NORMODYNE) 200 MG tablet Take 1 tablet (200 mg total) by mouth 2 (two) times daily.  . metoCLOPramide (REGLAN) 10 MG tablet TAKE AT ONSET OF MIGRAINE- CAN TAKE IT EVERY 6 HOURS IF NEEDED  . nystatin (MYCOSTATIN/NYSTOP) powder APPLY DAILY AFTER A SHOWER AS NEEDED  . ondansetron (ZOFRAN) 4 MG tablet Take 1 tablet (4 mg total) by mouth daily as needed for nausea or vomiting.  . Prenatal MV-Min-Fe Fum-FA-DHA (PRENATAL 1 PO) Take by mouth.  Marland Kitchen. PROCTOZONE-HC 2.5 % rectal cream APPLY RECTALLY TWICE DAILY AS NEEDED AFTER BOWEL MOVEMENT  . ranitidine (ZANTAC) 300  MG tablet TAKE ONE TABLET AT BEDTIME  . sertraline (ZOLOFT) 50 MG tablet Take 1 tablet (50 mg total) by mouth daily.  . SUMAtriptan (IMITREX) 6 MG/0.5ML SOLN injection 0.265mL SUBCUTANEOUSLY FOR HEADACHE MAY REPEAT IN 2 HOURS IF HEADACHE PERSISTS -NO MORE THEN 2 SHOTS IN 24 HOURS  . [DISCONTINUED] Drospirenone-Ethinyl Estradiol-Levomefol (SAFYRAL) 3-0.03-0.451 MG tablet Take 1 tablet by mouth daily.   No current facility-administered medications on file prior to visit.     Problem list She has Sinusitis, chronic; Allergic rhinitis; IBS; FEMALE INFERTILITY; ACNE VULGARIS; Fatigue; Malar rash; Migraines; Gestational hypertension; Obesity (BMI 30-39.9); Generalized anxiety disorder; and Insomnia on their problem list.   Review of Systems  Constitutional: Negative.  Negative for chills and fever.  HENT: Negative.   Respiratory: Negative.   Cardiovascular: Negative.   Gastrointestinal: Negative for abdominal distention, abdominal pain, constipation, diarrhea, nausea and vomiting.  Genitourinary: Positive for frequency, pelvic pain and vaginal discharge. Negative for decreased urine volume, difficulty urinating, dyspareunia, dysuria, enuresis, flank pain, genital sores, hematuria, menstrual problem, urgency, vaginal bleeding and vaginal pain.  Musculoskeletal: Negative.  Negative for myalgias.  Skin: Negative.  Negative for rash.  Neurological: Negative.   Hematological: Negative.   Psychiatric/Behavioral: Negative.        Objective:   Physical Exam  Constitutional: She is oriented to person, place, and time. She appears well-developed and well-nourished. No distress.  Neck: Normal range of motion. Neck supple.  Cardiovascular:  Normal rate and regular rhythm.  Pulmonary/Chest: Effort normal and breath sounds normal.  Abdominal: Soft. Bowel sounds are normal. She exhibits no distension and no mass. There is tenderness in the suprapubic area. There is no rigidity, no rebound, no guarding and  no CVA tenderness.  Genitourinary: There is no rash, tenderness, lesion or injury on the right labia. There is no rash, tenderness, lesion or injury on the left labia. Uterus is not enlarged and not tender. Cervix exhibits discharge. Cervix exhibits no motion tenderness and no friability. Right adnexum displays no mass, no tenderness and no fullness. Left adnexum displays no mass, no tenderness and no fullness. There is erythema in the vagina. No tenderness or bleeding in the vagina. No foreign body in the vagina. No signs of injury around the vagina. Vaginal discharge found.  Genitourinary Comments: + small numerous polyps around cervix  Musculoskeletal: Normal range of motion. She exhibits no tenderness.  Neurological: She is alert and oriented to person, place, and time.  Skin: Skin is warm and dry.      Assessment & Plan:  Eileen Rios was seen today for acute visit.  Diagnoses and all orders for this visit:  Needs flu shot -     FLU VACCINE MDCK QUAD W/Preservative  Pelvic pain/pelvic pain Check labs, if negative US versus GYN If any new or progressive symptoms message or call the office Any worsening pain go to ER -     CBC with Differential/Platelet -     BASIC METABOLIC PANEL WITH GFR -     Urinalysis, Routine w reflex microscopic -     Urine Culture -     C. trachomatis/N. gonorrhoeae RNA -     WET PREP BY MOLECULAR PROBE -     POCT urine pregnancy- NEGATIVE

## 2017-08-12 NOTE — Patient Instructions (Signed)
Pelvic Pain, Female °Pelvic pain is pain in your lower belly (abdomen), below your belly button and between your hips. The pain may start suddenly (acute), keep coming back (recurring), or last a long time (chronic). Pelvic pain that lasts longer than six months is considered chronic. There are many causes of pelvic pain. Sometimes the cause of your pelvic pain is not known. °Follow these instructions at home: °· Take over-the-counter and prescription medicines only as told by your doctor. °· Rest as told by your doctor. °· Do not have sex it if hurts. °· Keep a journal of your pelvic pain. Write down: °? When the pain started. °? Where the pain is located. °? What seems to make the pain better or worse, such as food or your menstrual cycle. °? Any symptoms you have along with the pain. °· Keep all follow-up visits as told by your doctor. This is important. °Contact a doctor if: °· Medicine does not help your pain. °· Your pain comes back. °· You have new symptoms. °· You have unusual vaginal discharge or bleeding. °· You have a fever or chills. °· You are having a hard time pooping (constipation). °· You have blood in your pee (urine) or poop (stool). °· Your pee smells bad. °· You feel weak or lightheaded. °Get help right away if: °· You have sudden pain that is very bad. °· Your pain continues to get worse. °· You have very bad pain and also have any of the following symptoms: °? A fever. °? Feeling stick to your stomach (nausea). °? Throwing up (vomiting). °? Being very sweaty. °· You pass out (lose consciousness). °This information is not intended to replace advice given to you by your health care provider. Make sure you discuss any questions you have with your health care provider. °Document Released: 03/10/2008 Document Revised: 10/17/2015 Document Reviewed: 07/13/2015 °Elsevier Interactive Patient Education © 2018 [employer] ° °

## 2017-08-13 ENCOUNTER — Encounter: Payer: Self-pay | Admitting: Physician Assistant

## 2017-08-13 LAB — BASIC METABOLIC PANEL WITH GFR
BUN: 15 mg/dL (ref 7–25)
CO2: 27 mmol/L (ref 20–32)
CREATININE: 0.92 mg/dL (ref 0.50–1.10)
Calcium: 10.2 mg/dL (ref 8.6–10.2)
Chloride: 104 mmol/L (ref 98–110)
GFR, EST NON AFRICAN AMERICAN: 83 mL/min/{1.73_m2} (ref >=60)
GFR, Est African American: 96 mL/min/{1.73_m2} (ref >=60)
Glucose, Bld: 98 mg/dL (ref 65–99)
POTASSIUM: 4.8 mmol/L (ref 3.5–5.3)
SODIUM: 138 mmol/L (ref 135–146)

## 2017-08-13 LAB — URINE CULTURE
MICRO NUMBER: 81253570
Result:: NO GROWTH
SPECIMEN QUALITY: ADEQUATE

## 2017-08-13 LAB — WET PREP BY MOLECULAR PROBE
Candida species: NOT DETECTED
Gardnerella vaginalis: NOT DETECTED
MICRO NUMBER:: 81252906
SPECIMEN QUALITY: ADEQUATE
Trichomonas vaginosis: NOT DETECTED

## 2017-08-13 LAB — URINALYSIS, ROUTINE W REFLEX MICROSCOPIC
Bilirubin Urine: NEGATIVE
Glucose, UA: NEGATIVE
HGB URINE DIPSTICK: NEGATIVE
Hyaline Cast: NONE SEEN /LPF
KETONES UR: NEGATIVE
Nitrite: NEGATIVE
PH: 6.5 (ref 5.0–8.0)
PROTEIN: NEGATIVE
RBC / HPF: NONE SEEN /HPF (ref 0–2)
Specific Gravity, Urine: 1.021 (ref 1.001–1.03)

## 2017-08-13 LAB — CBC WITH DIFFERENTIAL/PLATELET
BASOS ABS: 60 {cells}/uL (ref 0–200)
Basophils Relative: 0.7 %
EOS ABS: 267 {cells}/uL (ref 15–500)
Eosinophils Relative: 3.1 %
HEMATOCRIT: 38.8 % (ref 35.0–45.0)
HEMOGLOBIN: 13.3 g/dL (ref 11.7–15.5)
LYMPHS ABS: 2176 {cells}/uL (ref 850–3900)
MCH: 29.8 pg (ref 27.0–33.0)
MCHC: 34.3 g/dL (ref 32.0–36.0)
MCV: 86.8 fL (ref 80.0–100.0)
MONOS PCT: 10.4 %
MPV: 10.1 fL (ref 7.5–12.5)
NEUTROS ABS: 5203 {cells}/uL (ref 1500–7800)
NEUTROS PCT: 60.5 %
Platelets: 308 10*3/uL (ref 140–400)
RBC: 4.47 10*6/uL (ref 3.80–5.10)
RDW: 12.6 % (ref 11.0–15.0)
Total Lymphocyte: 25.3 %
WBC mixed population: 894 cells/uL (ref 200–950)
WBC: 8.6 10*3/uL (ref 3.8–10.8)

## 2017-08-13 LAB — C. TRACHOMATIS/N. GONORRHOEAE RNA
C. TRACHOMATIS RNA, TMA: NOT DETECTED
N. gonorrhoeae RNA, TMA: NOT DETECTED

## 2017-08-13 MED ORDER — METRONIDAZOLE 500 MG PO TABS: 500.0000 mg | ORAL_TABLET | Freq: Two times a day (BID) | ORAL | 0 refills | Status: AC

## 2017-08-13 NOTE — Addendum Note (Signed)
Addended by: Quentin MullingOLLIER, Lilyian Quayle R on: 08/13/2017 01:30 PM   Modules accepted: Orders

## 2017-08-20 ENCOUNTER — Ambulatory Visit: Payer: Self-pay | Admitting: Physician Assistant

## 2017-08-21 ENCOUNTER — Telehealth (INDEPENDENT_AMBULATORY_CARE_PROVIDER_SITE_OTHER): Payer: Self-pay | Admitting: Orthopaedic Surgery

## 2017-08-21 NOTE — Telephone Encounter (Signed)
Blue sheet completed, please call patient to discuss scheduling. Thank you

## 2017-08-21 NOTE — Telephone Encounter (Signed)
Ok to schedule surgery? Per Fayrene FearingJames last office note, surgery was discussed.

## 2017-08-21 NOTE — Telephone Encounter (Signed)
[facility]  [facility]heryl can do blue sheet. thanks

## 2017-08-21 NOTE — Telephone Encounter (Signed)
Fyi.  Please let me know if you need me to do anything further. 

## 2017-08-21 NOTE — Telephone Encounter (Signed)
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 She has another appt for 09/02/17.  She would like to know if she can go ahead and proceed with scheduling surgery without having to come back in again for the November appointment.    cb  [phone] Per patient, leave detailed message per patient should she not be able to answer.

## 2017-08-25 ENCOUNTER — Telehealth (INDEPENDENT_AMBULATORY_CARE_PROVIDER_SITE_OTHER): Payer: Self-pay | Admitting: Orthopaedic Surgery

## 2017-08-25 NOTE — Telephone Encounter (Signed)
[facility] CharterY[facility].  She would like to know the risks involved with this particular surgery.   cb  [phone]

## 2017-08-25 NOTE — Telephone Encounter (Signed)
Dr Yates called and discussed.  

## 2017-08-31 DIAGNOSIS — G5601 Carpal tunnel syndrome, right upper limb: Secondary | ICD-10-CM | POA: Diagnosis not present

## 2017-09-02 ENCOUNTER — Ambulatory Visit (INDEPENDENT_AMBULATORY_CARE_PROVIDER_SITE_OTHER): Payer: BLUE CROSS/BLUE SHIELD | Admitting: Orthopaedic Surgery

## 2017-09-07 ENCOUNTER — Encounter: Payer: Self-pay | Admitting: Physician Assistant

## 2017-09-07 DIAGNOSIS — R5383 Other fatigue: Secondary | ICD-10-CM

## 2017-09-07 MED ORDER — AMPHETAMINE-DEXTROAMPHETAMINE 10 MG PO TABS: 10.0000 mg | ORAL_TABLET | Freq: Two times a day (BID) | ORAL | 0 refills | Status: DC

## 2017-09-08 ENCOUNTER — Encounter (INDEPENDENT_AMBULATORY_CARE_PROVIDER_SITE_OTHER): Payer: Self-pay | Admitting: Orthopaedic Surgery

## 2017-09-08 ENCOUNTER — Ambulatory Visit (INDEPENDENT_AMBULATORY_CARE_PROVIDER_SITE_OTHER): Payer: BLUE CROSS/BLUE SHIELD | Admitting: Orthopaedic Surgery

## 2017-09-08 VITALS — BP 115/81 | HR 83 | Ht 63.0 in | Wt 215.0 lb

## 2017-09-08 DIAGNOSIS — G5601 Carpal tunnel syndrome, right upper limb: Secondary | ICD-10-CM

## 2017-09-08 NOTE — Progress Notes (Signed)
   Post-Op Visit Note   Patient: Eileen ScheuermannSamantha Horney           Date of Birth: Dec 05, 1985           MRN: [id number] Visit Date: 09/08/2017 PCP: Lucky CowboyMcKeown, William, MD   Assessment & Plan: Patient returns 1 Week Post Right Carpal Tunl. release.  Incision looks good dressing changed she can wear her splint and remove it for bathing and washing her hand.  Return in 1 week for suture removal.  Chief Complaint:  Chief Complaint  Patient presents with  . Right Wrist - Routine Post Op   Visit Diagnoses:  1. Carpal tunnel syndrome, right upper limb     Plan: Return in 1 week for suture removal.  Follow-Up Instructions: No Follow-up on file.   Orders:  No orders of the defined types were placed in this encounter.  No orders of the defined types were placed in this encounter.   Imaging: No results found.  PMFS History: Patient Active Problem List   Diagnosis Date Noted  . Insomnia 03/07/2015  . Generalized anxiety disorder 11/07/2014  . Obesity (BMI 30-39.9) 12/07/2013  . Gestational hypertension 05/29/2013  . Allergic rhinitis 08/20/2010  . FEMALE INFERTILITY 12/13/2009  . Malar rash 12/13/2009  . Fatigue 07/03/2009  . Sinusitis, chronic 10/24/2008  . Migraines 10/24/2008  . IBS 06/06/2008  . ACNE VULGARIS 06/06/2008   Past Medical History:  Diagnosis Date  . Anxiety   . Depression   . Headache(784.0)    migraines  . Hypertension   . IBS (irritable bowel syndrome)   . Insulin resistance   . Seasonal allergies   . Urticaria     Family History  Problem Relation Age of Onset  . Diabetes Mother   . Hypertension Father   . Migraines Neg Hx     Past Surgical History:  Procedure Laterality Date  . CESAREAN SECTION N/A 06/05/2013   Procedure: Primary cesarean section with delivery of baby boy at 1033.  Apgars 1/8.;  Surgeon: Genia DelMarie-Lyne Lavoie, MD;  Location: [facility];  Service: Obstetrics;  Laterality: N/A;  . WISDOM TOOTH EXTRACTION     Social History   Occupational  History  . Not on file  Tobacco Use  . Smoking status: Never Smoker  . Smokeless tobacco: Never Used  Substance and Sexual Activity  . Alcohol use: No  . Drug use: No  . Sexual activity: Yes

## 2017-09-09 ENCOUNTER — Other Ambulatory Visit: Payer: Self-pay | Admitting: Physician Assistant

## 2017-09-16 ENCOUNTER — Ambulatory Visit (INDEPENDENT_AMBULATORY_CARE_PROVIDER_SITE_OTHER): Payer: BLUE CROSS/BLUE SHIELD | Admitting: Orthopaedic Surgery

## 2017-09-16 ENCOUNTER — Encounter (INDEPENDENT_AMBULATORY_CARE_PROVIDER_SITE_OTHER): Payer: Self-pay | Admitting: Orthopaedic Surgery

## 2017-09-16 ENCOUNTER — Other Ambulatory Visit: Payer: Self-pay | Admitting: Physician Assistant

## 2017-09-16 VITALS — BP 126/78 | HR 80

## 2017-09-16 DIAGNOSIS — G5601 Carpal tunnel syndrome, right upper limb: Secondary | ICD-10-CM

## 2017-09-16 DIAGNOSIS — A084 Viral intestinal infection, unspecified: Secondary | ICD-10-CM

## 2017-09-16 NOTE — Progress Notes (Signed)
   Post-Op Visit Note   Patient: Eileen Rios           Date of Birth: 1986-05-21           MRN: [id number] Visit Date: 09/16/2017 PCP: Lucky CowboyMcKeown, William, MD   Assessment & Plan: Post right carpal tunnel release.  One suture caught on something in the splint and got pulled out.  Wrist the sutures are removed Steri-Strips applied.  Opposite left hand gives her minimal problems and look fairly good on carpal tunnel nerve conduction tests without significant disease at this point.  We will continue to follow the left hand if he gets where she can return.  Chief Complaint:  Chief Complaint  Patient presents with  . Right Wrist - Routine Post Op   Visit Diagnoses:  1. Right carpal tunnel syndrome     Plan: Sutures removed.  She can gradually increase her activity.  Return if she has persistent problems.  Follow-Up Instructions: No Follow-up on file.   Orders:  No orders of the defined types were placed in this encounter.  No orders of the defined types were placed in this encounter.   Imaging: No results found.  PMFS History: Patient Active Problem List   Diagnosis Date Noted  . Insomnia 03/07/2015  . Generalized anxiety disorder 11/07/2014  . Obesity (BMI 30-39.9) 12/07/2013  . Gestational hypertension 05/29/2013  . Allergic rhinitis 08/20/2010  . FEMALE INFERTILITY 12/13/2009  . Malar rash 12/13/2009  . Fatigue 07/03/2009  . Sinusitis, chronic 10/24/2008  . Migraines 10/24/2008  . IBS 06/06/2008  . ACNE VULGARIS 06/06/2008   Past Medical History:  Diagnosis Date  . Anxiety   . Depression   . Headache(784.0)    migraines  . Hypertension   . IBS (irritable bowel syndrome)   . Insulin resistance   . Seasonal allergies   . Urticaria     Family History  Problem Relation Age of Onset  . Diabetes Mother   . Hypertension Father   . Migraines Neg Hx     Past Surgical History:  Procedure Laterality Date  . CESAREAN SECTION N/A 06/05/2013   Procedure: Primary  cesarean section with delivery of baby boy at 1033.  Apgars 1/8.;  Surgeon: Genia DelMarie-Lyne Lavoie, MD;  Location: [facility];  Service: Obstetrics;  Laterality: N/A;  . WISDOM TOOTH EXTRACTION     Social History   Occupational History  . Not on file  Tobacco Use  . Smoking status: Never Smoker  . Smokeless tobacco: Never Used  Substance and Sexual Activity  . Alcohol use: No  . Drug use: No  . Sexual activity: Yes

## 2017-09-23 ENCOUNTER — Encounter: Payer: Self-pay | Admitting: Adult Health

## 2017-09-23 ENCOUNTER — Ambulatory Visit: Payer: BLUE CROSS/BLUE SHIELD | Admitting: Adult Health

## 2017-09-23 VITALS — BP 124/82 | HR 89 | Temp 97.3°F | Ht 63.0 in | Wt 214.0 lb

## 2017-09-23 DIAGNOSIS — J329 Chronic sinusitis, unspecified: Secondary | ICD-10-CM

## 2017-09-23 DIAGNOSIS — J0100 Acute maxillary sinusitis, unspecified: Secondary | ICD-10-CM | POA: Diagnosis not present

## 2017-09-23 MED ORDER — PREDNISONE 20 MG PO TABS: ORAL_TABLET | ORAL | 0 refills | Status: DC

## 2017-09-23 MED ORDER — AMOXICILLIN 500 MG PO TABS: 500.0000 mg | ORAL_TABLET | Freq: Three times a day (TID) | ORAL | 0 refills | Status: DC

## 2017-09-23 NOTE — Progress Notes (Signed)
Assessment and Plan:  Eileen Rios was seen today for uri.  Diagnoses and all orders for this visit:  Subacute maxillary sinusitis - Discussed the importance of avoiding unnecessary antibiotic therapy - abx printed and given with instructions to initiate if she develops a fever, symptoms worsen, or not improving in 3 days Suggested symptomatic OTC remedies. Nasal saline spray for congestion. Continue allergy pill, start oral steroids Follow up as needed. -     predniSONE (DELTASONE) 20 MG tablet; 2 tablets daily for 3 days, 1 tablet daily for 4 days. -     amoxicillin (AMOXIL) 500 MG tablet; Take 1 tablet (500 mg total) by mouth 3 (three) times daily. 10 days  Further disposition pending results of labs. Discussed med's effects and SE's.   Over 15 minutes of exam, counseling, chart review, and critical decision making was performed.   Future Appointments  Date Time Provider Department Center  11/09/2017  3:00 PM Quentin MullingCollier, Amanda, PA-C [facility] None  01/11/2018  9:30 AM Butch PennyMillikan, Megan, NP [facility] None    ------------------------------------------------------------------------------------------------------------------   HPI 31 y.o.female presents for facial pressure, tickle in throat, ear pressure/itching, productive cough ongoing for 5 days. She has been taking dayquil/nyquil, sudafed with minor improvements in facial pressure. She denies fever/chils, endorses fatigue/poor sleep quality. Denies wheezing/SOB - no hx of asthma.   She endorses year round allergies and takes daily zyrtec and allegra; she does not use flonase or other medicated nasal sprays as they seem to trigger headaches, does utilize saline nasal sprays. She reports hx of recurrent sinusitis - reports has been prescribed azithromycin on several occasions for these symptoms but has not been effective.   She reports she has seen allergist for chronic dust allergy.   Past Medical History:  Diagnosis Date  . Anxiety   .  Depression   . Headache(784.0)    migraines  . Hypertension   . IBS (irritable bowel syndrome)   . Insulin resistance   . Seasonal allergies   . Urticaria      No Known Allergies  Current Outpatient Medications on File Prior to Visit  Medication Sig  . ALPRAZolam (XANAX) 0.5 MG tablet TAKE 1/2 TO 1 TABLET TWICE DAILY AS NEEDED FOR ANXIETY  . amphetamine-dextroamphetamine (ADDERALL) 10 MG tablet Take 1 tablet (10 mg total) by mouth 2 (two) times daily with a meal.  . azelastine (OPTIVAR) 0.05 % ophthalmic solution [facility]  . B-D INS SYR ULTRAFINE .5CC/30G 30G X 1/2" 0.5 ML MISC AS DIRECTED  . cetirizine (ZYRTEC) 10 MG tablet Take 10 mg by mouth daily as needed for allergies.  . Chlorphen-PE-Acetaminophen 4-10-325 MG TABS Take 1 tablet by mouth 3 (three) times daily as needed.  . clindamycin (CLINDAGEL) 1 % gel Apply 1 application topically 2 (two) times daily.  Marland Kitchen. labetalol (NORMODYNE) 200 MG tablet TAKE ONE TABLET TWICE DAILY  . metoCLOPramide (REGLAN) 10 MG tablet TAKE AT ONSET OF MIGRAINE -CAN TAKE EVERY 6 HOURS IF NEEDED  . nystatin (MYCOSTATIN/NYSTOP) powder APPLY DAILY AFTER A SHOWER AS NEEDED  . ondansetron (ZOFRAN) 4 MG tablet Take 1 tablet (4 mg total) by mouth daily as needed for nausea or vomiting.  . Prenatal MV-Min-Fe Fum-FA-DHA (PRENATAL 1 PO) Take by mouth.  Marland Kitchen. PROCTOZONE-HC 2.5 % rectal cream APPLY RECTALLY TWICE DAILY AS NEEDED AFTER BOWEL MOVEMENT  . ranitidine (ZANTAC) 300 MG tablet TAKE ONE TABLET AT BEDTIME  . sertraline (ZOLOFT) 50 MG tablet Take 1 tablet (50 mg total)  by mouth daily.  . SUMAtriptan (IMITREX) 6 MG/0.5ML SOLN injection 0.435mL SUBCUTANEOUSLY FOR HEADACHE MAY REPEAT IN 2 HOURS IF HEADACHE PERSISTS -NO MORE THEN 2 SHOTS IN 24 HOURS  . [DISCONTINUED] Drospirenone-Ethinyl Estradiol-Levomefol (SAFYRAL) 3-0.03-0.451 MG tablet Take 1 tablet by mouth daily.   No current facility-administered medications on file prior to visit.      ROS: Review of Systems  Constitutional: Positive for malaise/fatigue. Negative for chills, diaphoresis and fever.  HENT: Positive for congestion, sinus pain and sore throat. Negative for ear discharge, ear pain, hearing loss and tinnitus.   Eyes: Negative for blurred vision, pain, discharge and redness.  Respiratory: Positive for cough. Negative for hemoptysis, sputum production, shortness of breath, wheezing and stridor.   Cardiovascular: Negative for chest pain, palpitations and orthopnea.  Gastrointestinal: Negative for abdominal pain, diarrhea, nausea and vomiting.  Genitourinary: Negative.   Musculoskeletal: Negative for joint pain and myalgias.  Skin: Negative for rash.  Neurological: Negative for dizziness, sensory change, weakness and headaches.  Endo/Heme/Allergies: Negative for environmental allergies.  Psychiatric/Behavioral: Negative.   All other systems reviewed and are negative.    Physical Exam:  BP 124/82   Pulse 89   Temp (!) 97.3 F (36.3 C)   Ht 5\' 3"  (1.6 m)   Wt 214 lb (97.1 kg)   SpO2 98%   BMI 37.91 kg/m   General Appearance: Well nourished, flushed, in no acute distress. Eyes: PERRLA, EOMs, conjunctiva no swelling or erythema Sinuses: No bilateral maxillary sinus tenderness ENT/Mouth: Ext aud canals clear, TMs without erythema, bulging. No erythema, swelling, or exudate on post pharynx.  Tonsils not swollen or erythematous. Hearing normal.  Neck: Supple, thyroid normal. Somewhat tender over eustachian tubes/upper neck bilaterally without palpable lymph nodes.  Respiratory: Respiratory effort normal, BS equal bilaterally without rales, rhonchi, wheezing or stridor.  Cardio: RRR with no MRGs. Brisk peripheral pulses without edema.  Abdomen: Soft, + BS.  Non tender, no guarding, rebound, hernias, masses. Lymphatics: Non tender without lymphadenopathy.  Musculoskeletal: normal gait.  Skin: Warm, dry without rashes, lesions, ecchymosis.  Neuro: Cranial  nerves intact. Normal muscle tone, no cerebellar symptoms. Sensation intact.  Psych: Awake and oriented X 3, normal affect, Insight and Judgment appropriate.    Dan MakerAshley C Klark Vanderhoef, NP 3:12 PM [facility]

## 2017-09-23 NOTE — Patient Instructions (Signed)

## 2017-10-09 ENCOUNTER — Other Ambulatory Visit: Payer: Self-pay | Admitting: Physician Assistant

## 2017-11-03 ENCOUNTER — Encounter: Payer: Self-pay | Admitting: Physician Assistant

## 2017-11-03 DIAGNOSIS — R5383 Other fatigue: Secondary | ICD-10-CM

## 2017-11-03 MED ORDER — AMPHETAMINE-DEXTROAMPHETAMINE 10 MG PO TABS: 10.0000 mg | ORAL_TABLET | Freq: Two times a day (BID) | ORAL | 0 refills | Status: DC

## 2017-11-04 ENCOUNTER — Encounter: Payer: Self-pay | Admitting: Physician Assistant

## 2017-11-09 ENCOUNTER — Ambulatory Visit: Payer: BLUE CROSS/BLUE SHIELD | Admitting: Physician Assistant

## 2017-11-09 ENCOUNTER — Encounter: Payer: Self-pay | Admitting: Physician Assistant

## 2017-11-09 VITALS — BP 124/78 | HR 68 | Temp 97.3°F | Resp 14 | Ht 64.0 in | Wt 210.0 lb

## 2017-11-09 DIAGNOSIS — R5383 Other fatigue: Secondary | ICD-10-CM

## 2017-11-09 DIAGNOSIS — Z0000 Encounter for general adult medical examination without abnormal findings: Secondary | ICD-10-CM

## 2017-11-09 DIAGNOSIS — G4700 Insomnia, unspecified: Secondary | ICD-10-CM

## 2017-11-09 DIAGNOSIS — J329 Chronic sinusitis, unspecified: Secondary | ICD-10-CM

## 2017-11-09 DIAGNOSIS — O132 Gestational [pregnancy-induced] hypertension without significant proteinuria, second trimester: Secondary | ICD-10-CM

## 2017-11-09 DIAGNOSIS — E669 Obesity, unspecified: Secondary | ICD-10-CM

## 2017-11-09 DIAGNOSIS — G43809 Other migraine, not intractable, without status migrainosus: Secondary | ICD-10-CM

## 2017-11-09 DIAGNOSIS — L708 Other acne: Secondary | ICD-10-CM

## 2017-11-09 DIAGNOSIS — K589 Irritable bowel syndrome without diarrhea: Secondary | ICD-10-CM

## 2017-11-09 DIAGNOSIS — Z79899 Other long term (current) drug therapy: Secondary | ICD-10-CM

## 2017-11-09 DIAGNOSIS — F411 Generalized anxiety disorder: Secondary | ICD-10-CM

## 2017-11-09 DIAGNOSIS — E785 Hyperlipidemia, unspecified: Secondary | ICD-10-CM

## 2017-11-09 DIAGNOSIS — E559 Vitamin D deficiency, unspecified: Secondary | ICD-10-CM

## 2017-11-09 DIAGNOSIS — Z0001 Encounter for general adult medical examination with abnormal findings: Secondary | ICD-10-CM

## 2017-11-09 DIAGNOSIS — J309 Allergic rhinitis, unspecified: Secondary | ICD-10-CM

## 2017-11-09 DIAGNOSIS — Z6836 Body mass index (BMI) 36.0-36.9, adult: Secondary | ICD-10-CM

## 2017-11-09 MED ORDER — CHLORPHEN-PE-ACETAMINOPHEN 4-10-325 MG PO TABS: 1.0000 | ORAL_TABLET | Freq: Three times a day (TID) | ORAL | 6 refills | Status: DC | PRN

## 2017-11-09 NOTE — Progress Notes (Signed)
Complete Physical  Assessment and Plan:  Pregnancy-induced hypertension in second trimester Discussed need weight loss and to switch or discontinue many of her medication before that time -     Urinalysis, Routine w reflex microscopic -     Microalbumin / creatinine urine ratio  Obesity (BMI 30-39.9) Discussed need weight loss -     Hemoglobin A1c  Other migraine without status migrainosus, not intractable Continue zoloft for now, may switch back to effexor after pregnancy Reglan PRN  Insomnia, unspecified type Xanax PRN  Other fatigue - Discussed lifestyle modification as means of resolving problem, Training modifications discussed, See orders for lab evaluation, Discussed how depression can be a cause of fatigue if all labs are negative will discuss depression treatment.  -     TSH -     Iron and TIBC -     Ferritin -     Vitamin B12  Generalized anxiety disorder Off effexor and she is on zoloft  Vitamin D deficiency -     VITAMIN D 25 Hydroxy (Vit-D Deficiency, Fractures)  Medication management -     CBC with Differential/Platelet -     BASIC METABOLIC PANEL WITH GFR -     Hepatic function panel -     Magnesium  Hyperlipidemia, unspecified hyperlipidemia type -     Lipid panel  Encounter for general adult medical examination with abnormal findings 1 year  . Discussed med's effects and SE's. Screening labs and tests as requested with regular follow-up as recommended. Over 40 minutes of exam, counseling, chart review and critical decision making was performed  HPI  This very nice 32 y.o.female presents for complete physical.    She continues to have suprapubic discomfort, intermittent, will last 20 mins at the most, normally 5 mins or less. Nothing makes it worse. Not with food, not with BM, not with urination. No fever, chills. Normal US at GYN.   She has had SOB intermittent, she states at rest will feel that she can not get a deep breath. She is still on  zantac 300 mg at night.   She does not workout.  Finally, patient has history of Vitamin D Deficiency and last vitamin D was  Lab Results  Component Value Date   VD25OH 38 11/05/2016  Currently on supplementation  She has repeated sinus infections, follows with ENT.  She has had a negative sleep study other than she was not hitting REM sleep, she was tried on nuvigil but could not tolerate it, and has been on adderall that has helped. She is on reglan 2 x a day, and states it is helping with the headaches. She is on Norel every other day.  She is seeing a therapist every 2 weeks that she is helping.  Andres Egeden is 4, she is on clomid and actively trying. She is on labetolol.  BMI is Body mass index is 36.05 kg/m., she is working on diet and exercise. Wt Readings from Last 3 Encounters:  11/09/17 210 lb (95.3 kg)  09/23/17 214 lb (97.1 kg)  09/08/17 215 lb (97.5 kg)     Current Medications:  Current Outpatient Medications on File Prior to Visit  Medication Sig Dispense Refill  . ALPRAZolam (XANAX) 0.5 MG tablet TAKE 1/2 TO 1 TABLET TWICE DAILY AS NEEDED FOR ANXIETY 60 tablet 1  . amphetamine-dextroamphetamine (ADDERALL) 10 MG tablet Take 1 tablet (10 mg total) by mouth 2 (two) times daily with a meal. 60 tablet 0  . B-D INS SYR  ULTRAFINE .5CC/30G 30G X 1/2" 0.5 ML MISC AS DIRECTED 90 each 1  . cetirizine (ZYRTEC) 10 MG tablet Take 10 mg by mouth daily as needed for allergies.    . Chlorphen-PE-Acetaminophen 4-10-325 MG TABS Take 1 tablet by mouth 3 (three) times daily as needed. 30 tablet 0  . clindamycin (CLINDAGEL) 1 % gel Apply 1 application topically 2 (two) times daily. 30 g 2  . labetalol (NORMODYNE) 200 MG tablet TAKE ONE TABLET TWICE DAILY 60 tablet 2  . metoCLOPramide (REGLAN) 10 MG tablet TAKE AT ONSET OF MIGRAINE -CAN TAKE EVERY 6 HOURS IF NEEDED 60 tablet 0  . nystatin (MYCOSTATIN/NYSTOP) powder APPLY DAILY AFTER A SHOWER AS NEEDED 60 g 1  . ondansetron (ZOFRAN) 4 MG tablet Take  1 tablet (4 mg total) by mouth daily as needed for nausea or vomiting. 30 tablet 1  . Prenatal MV-Min-Fe Fum-FA-DHA (PRENATAL 1 PO) Take by mouth.    Marland Kitchen PROCTOZONE-HC 2.5 % rectal cream APPLY RECTALLY TWICE DAILY AS NEEDED AFTER BOWEL MOVEMENT 30 g 0  . ranitidine (ZANTAC) 300 MG tablet TAKE ONE TABLET AT BEDTIME 30 tablet 6  . sertraline (ZOLOFT) 50 MG tablet TAKE ONE TABLET DAILY 30 tablet 2  . SUMAtriptan (IMITREX) 6 MG/0.5ML SOLN injection 0.65mL SUBCUTANEOUSLY FOR HEADACHE MAY REPEAT IN 2 HOURS IF HEADACHE PERSISTS -NO MORE THEN 2 SHOTS IN 24 HOURS 8 mL 1  . [DISCONTINUED] Drospirenone-Ethinyl Estradiol-Levomefol (SAFYRAL) 3-0.03-0.451 MG tablet Take 1 tablet by mouth daily. 1 Package 3   No current facility-administered medications on file prior to visit.    Health Maintenance:   Immunization History  Administered Date(s) Administered  . H1N1 10/24/2008  . Influenza Inj Mdck Quad With Preservative 08/12/2017  . Influenza Whole 10/24/2008, 10/11/2010  . Influenza, Seasonal, Injecte, Preservative Fre 09/19/2015  . Td 11/13/2005  . Tdap 04/28/2013   TD/TDAP: 2014 Influenza:2018 Pneumovax:  Prevnar 13:   LMP: Patient's last menstrual period was 10/16/2017. Sexually Active: yes STD testing offered Pap: at GYN getting the 13th MGM: : N/A  Allergies: No Known Allergies Medical History:  has Sinusitis, chronic; Allergic rhinitis; IBS; ACNE VULGARIS; Fatigue; Migraines; Gestational hypertension; Obesity (BMI 30-39.9); Generalized anxiety disorder; and Insomnia on their problem list. Surgical History:  She  has a past surgical history that includes Wisdom tooth extraction and Cesarean section (N/A, 06/05/2013). Family History:  Her family history includes Diabetes in her mother; Hypertension in her father. Social History:   reports that  has never smoked. she has never used smokeless tobacco. She reports that she does not drink alcohol or use drugs.  Review of Systems: Review of  Systems  Constitutional: Negative.   HENT: Negative.   Eyes: Negative.   Respiratory: Negative.   Cardiovascular: Negative.   Gastrointestinal: Negative.   Genitourinary: Negative.   Musculoskeletal: Negative.   Skin: Negative.   Neurological: Negative.   Endo/Heme/Allergies: Negative.   Psychiatric/Behavioral: Negative.     Physical Exam: Estimated body mass index is 36.05 kg/m as calculated from the following:   Height as of this encounter: 5\' 4"  (1.626 m).   Weight as of this encounter: 210 lb (95.3 kg). BP 124/78   Pulse 68   Temp (!) 97.3 F (36.3 C)   Resp 14   Ht 5\' 4"  (1.626 m)   Wt 210 lb (95.3 kg)   LMP 10/16/2017   SpO2 95%   BMI 36.05 kg/m  General Appearance: Well nourished, in no apparent distress.  Eyes: PERRLA, EOMs, conjunctiva no swelling  or erythema, normal fundi and vessels.  Sinuses: No Frontal/maxillary tenderness  ENT/Mouth: Ext aud canals clear, normal light reflex with TMs without erythema, bulging. Good dentition. No erythema, swelling, or exudate on post pharynx. Tonsils not swollen or erythematous. Hearing normal.  Neck: Supple, thyroid normal. No bruits  Respiratory: Respiratory effort normal, BS equal bilaterally without rales, rhonchi, wheezing or stridor.  Cardio: RRR without murmurs, rubs or gallops. Brisk peripheral pulses without edema.  Chest: symmetric, with normal excursions and percussion.  Breasts: Symmetric, without lumps, nipple discharge, retractions.  Abdomen: Soft, nontender, no guarding, rebound, hernias, masses, or organomegaly.  Lymphatics: Non tender without lymphadenopathy.  Genitourinary: defer Musculoskeletal: Full ROM all peripheral extremities,5/5 strength, and normal gait.  Skin: Warm, dry without rashes, lesions, ecchymosis. Neuro: Cranial nerves intact, reflexes equal bilaterally. Normal muscle tone, no cerebellar symptoms. Sensation intact.  Psych: Awake and oriented X 3, normal affect, Insight and Judgment  appropriate.   EKG: defer  Quentin Mulling 3:23 PM [facility]

## 2017-11-09 NOTE — Patient Instructions (Addendum)
Vitamin D goal is between 60-80  Please make sure that you are taking your Vitamin D as directed.   It is very important as a natural anti-inflammatory   helping hair, skin, and nails, as well as reducing stroke and heart attack risk.   It helps your bones and helps with mood.  We want you on at least 5000 IU daily  It also decreases numerous cancer risks so please take it as directed.   Low Vit D is associated with a 200-300% higher risk for CANCER   and 200-300% higher risk for HEART   ATTACK  &  STROKE.    .....................................Marland Kitchen  It is also associated with higher death rate at younger ages,   autoimmune diseases like Rheumatoid arthritis, Lupus, Multiple Sclerosis.     Also many other serious conditions, like depression, Alzheimer's  Dementia, infertility, muscle aches, fatigue, fibromyalgia - just to name a few.  +++++++++++++++++++  Can get liquid vitamin D from [place] here in [place] at  [facility] alternatives [address] [place], [place] [address] Or you can try earth fare   Zantac 314m BID  Avoid alcohol, spicy foods, NSAIDS (aleve, ibuprofen) at this time. See foods below.   Food Choices for Gastroesophageal Reflux Disease When you have gastroesophageal reflux disease (GERD), the foods you eat and your eating habits are very important. Choosing the right foods can help ease the discomfort of GERD. WHAT GENERAL GUIDELINES DO I NEED TO FOLLOW?  Choose fruits, vegetables, whole grains, low-fat dairy products, and low-fat meat, fish, and poultry.  Limit fats such as oils, salad dressings, butter, nuts, and avocado.  Keep a food diary to identify foods that cause symptoms.  Avoid foods that cause reflux. These may be different for different people.  Eat frequent small meals instead of three large meals each day.  Eat your meals slowly, in a relaxed setting.  Limit fried foods.  Cook foods using methods other than frying.  Avoid  drinking alcohol.  Avoid drinking large amounts of liquids with your meals.  Avoid bending over or lying down until 2-3 hours after eating. WHAT FOODS ARE NOT RECOMMENDED? The following are some foods and drinks that may worsen your symptoms: Vegetables Tomatoes. Tomato juice. Tomato and spaghetti sauce. Chili peppers. Onion and garlic. Horseradish. Fruits Oranges, grapefruit, and lemon (fruit and juice). Meats High-fat meats, fish, and poultry. This includes hot dogs, ribs, ham, sausage, salami, and bacon. Dairy Whole milk and chocolate milk. Sour cream. Cream. Butter. Ice cream. Cream cheese.  Beverages Coffee and tea, with or without caffeine. Carbonated beverages or energy drinks. Condiments Hot sauce. Barbecue sauce.  Sweets/Desserts Chocolate and cocoa. Donuts. Peppermint and spearmint. Fats and Oils High-fat foods, including [place] and potato chips. Other Vinegar. Strong spices, such as black pepper, white pepper, red pepper, cayenne, curry powder, cloves, ginger, and chili powder.    Chronic Fatigue Syndrome Chronic fatigue syndrome (CFS) is a condition that causes extreme tiredness (fatigue). This fatigue does not improve with rest, and it gets worse with physical or mental activity. You may have several other symptoms along with fatigue. Symptoms may come and go, but they generally last for months. Sometimes, CFS gets better over time, but it can be a lifelong condition. There is no cure, but there are many possible treatments. You will need to work with your health care providers to find a treatment plan that works best for you. What are the causes? The cause of CFS is not known.  There may be more than one cause. Possible causes include:  An infection.  An abnormal body defense system (immune system).  Low blood pressure.  Poor diet.  Physical or emotional stress.  What increases the risk? You are more likely to develop this condition if:  You are  female.  You are 71?33 years old.  You have a family history of CFS.  You live with a lot of emotional stress.  What are the signs or symptoms? The main symptom of CFS is fatigue that is severe enough to interfere with day-to-day activities. This fatigue does not get better with rest, and it gets worse with physical or mental activity. There are eight other major symptoms of CFS:  Lack of energy (malaise) that lasts more than 24 hours after physical exertion.  Sleep that does not relieve fatigue (unrefreshing sleep).  Short-term memory loss or confusion.  Joint pain without redness or swelling.  Muscle aches.  Headaches.  Painful and swollen glands (lymph nodes) in the neck or under the arms.  Sore throat.  You may also have:  Abdominal cramps, constipation, or diarrhea (irritable bowel).  Chills.  Night sweats.  Vision changes.  Dizziness.  Mental confusion (brain fog).  Clumsiness.  Sensitivity to food, noise, or odors.  Mood swings, depression, or anxiety attacks.  How is this diagnosed? There are no tests that can diagnose this condition. Your health care provider will make the diagnosis based on your medical history, a physical exam, and a mental health exam. However, it is important to make sure that your symptoms are not caused by another medical condition. You may have lab tests or X-rays to rule out other conditions. For your health care provider to diagnose CFS:  You must have had fatigue for at least 6 straight months.  Fatigue must be your first symptom, and it must be severe enough to interfere with day-to-day activities.  There must be no other cause found for the fatigue.  You must also have at least four of the eight other major symptoms of CFS.  How is this treated? There is no cure for CFS. The condition affects everyone differently. You will need to work with your team of health care providers to find the best treatments for your  symptoms. Your team may include your primary care provider, physical and exercise therapists, and mental health therapists. Treatment may include:  Improving sleep with a regular bedtime routine.  Avoiding caffeine, alcohol, and tobacco.  Doing light exercise and stretching during the day.  Taking medicines to help you sleep or to relieve joint or muscle pain.  Learning and practicing relaxation techniques.  Using memory aids or doing brainteasers to improve memory and concentration.  Seeing a mental health therapist to evaluate and treat depression, if necessary.  Trying massage therapy, acupuncture, and movement exercises, such as yoga or tai chi.  Follow these instructions at home:  Activity  Exercise regularly, as told by your health care provider.  Avoid fatigue by pacing yourself during the day and getting enough sleep at night.  Go to bed and get up at the same time every day. Eating and drinking  Avoid caffeine and alcohol.  Avoid heavy meals in the evening.  Eat a well-balanced diet. General instructions  Take over-the-counter and prescription medicines only as told by your health care provider.  Do not use herbal or dietary supplements unless they are approved by your health care provider.  Maintain a healthy weight.  Avoid stress and  use stress-reducing techniques that you learn in therapy.  Do not use any products that contain nicotine or tobacco, such as cigarettes and e-cigarettes. If you need help quitting, ask your health care provider.  Consider joining a CFS support group.  Keep all follow-up visits as told by your health care provider. This is important. Contact a health care provider if:  Your symptoms do not get better or they get worse.  You feel angry, guilty, anxious, or depressed. This information is not intended to replace advice given to you by your health care provider. Make sure you discuss any questions you have with your health care  provider. Document Released: 10/30/2004 Document Revised: 05/29/2016 Document Reviewed: 12/31/2015 Elsevier Interactive Patient Education  [employer].

## 2017-11-10 ENCOUNTER — Encounter: Payer: Self-pay | Admitting: Physician Assistant

## 2017-11-10 LAB — BASIC METABOLIC PANEL WITH GFR
BUN: 15 mg/dL (ref 7–25)
CO2: 25 mmol/L (ref 20–32)
Calcium: 9.8 mg/dL (ref 8.6–10.2)
Chloride: 105 mmol/L (ref 98–110)
Creat: 0.89 mg/dL (ref 0.50–1.10)
GFR, EST NON AFRICAN AMERICAN: 86 mL/min/{1.73_m2} (ref >=60)
GFR, Est African American: 100 mL/min/{1.73_m2} (ref >=60)
GLUCOSE: 83 mg/dL (ref 65–99)
POTASSIUM: 4.2 mmol/L (ref 3.5–5.3)
SODIUM: 137 mmol/L (ref 135–146)

## 2017-11-10 LAB — CBC WITH DIFFERENTIAL/PLATELET
Basophils Absolute: 70 cells/uL (ref 0–200)
Basophils Relative: 0.7 %
EOS ABS: 260 {cells}/uL (ref 15–500)
EOS PCT: 2.6 %
HCT: 39.3 % (ref 35.0–45.0)
Hemoglobin: 13.4 g/dL (ref 11.7–15.5)
Lymphs Abs: 2830 cells/uL (ref 850–3900)
MCH: 29.5 pg (ref 27.0–33.0)
MCHC: 34.1 g/dL (ref 32.0–36.0)
MCV: 86.6 fL (ref 80.0–100.0)
MONOS PCT: 8.8 %
MPV: 10.3 fL (ref 7.5–12.5)
NEUTROS PCT: 59.6 %
Neutro Abs: 5960 cells/uL (ref 1500–7800)
Platelets: 319 10*3/uL (ref 140–400)
RBC: 4.54 10*6/uL (ref 3.80–5.10)
RDW: 13 % (ref 11.0–15.0)
Total Lymphocyte: 28.3 %
WBC mixed population: 880 cells/uL (ref 200–950)
WBC: 10 10*3/uL (ref 3.8–10.8)

## 2017-11-10 LAB — HEMOGLOBIN A1C
Hgb A1c MFr Bld: 5.2 % of total Hgb (ref <5.7)
MEAN PLASMA GLUCOSE: 103 (calc)
eAG (mmol/L): 5.7 (calc)

## 2017-11-10 LAB — URINALYSIS, ROUTINE W REFLEX MICROSCOPIC
Bilirubin Urine: NEGATIVE
Glucose, UA: NEGATIVE
HYALINE CAST: NONE SEEN /LPF
Hgb urine dipstick: NEGATIVE
Ketones, ur: NEGATIVE
NITRITE: NEGATIVE
PROTEIN: NEGATIVE
RBC / HPF: NONE SEEN /HPF (ref 0–2)
SPECIFIC GRAVITY, URINE: 1.025 (ref 1.001–1.03)
pH: <=5 (ref 5.0–8.0)

## 2017-11-10 LAB — MICROALBUMIN / CREATININE URINE RATIO
Creatinine, Urine: 184 mg/dL (ref 20–275)
MICROALB UR: 0.6 mg/dL
MICROALB/CREAT RATIO: 3 ug/mg{creat} (ref <30)

## 2017-11-10 LAB — IRON, TOTAL/TOTAL IRON BINDING CAP
%SAT: 18 % (calc) (ref 11–50)
Iron: 58 ug/dL (ref 40–190)
TIBC: 331 ug/dL (ref 250–450)

## 2017-11-10 LAB — LIPID PANEL
CHOL/HDL RATIO: 4.2 (calc) (ref <5.0)
Cholesterol: 191 mg/dL (ref <200)
HDL: 45 mg/dL — ABNORMAL LOW (ref >50)
LDL Cholesterol (Calc): 109 mg/dL (calc) — ABNORMAL HIGH
NON-HDL CHOLESTEROL (CALC): 146 mg/dL — AB (ref <130)
Triglycerides: 242 mg/dL — ABNORMAL HIGH (ref <150)

## 2017-11-10 LAB — TSH: TSH: 1.55 mIU/L

## 2017-11-10 LAB — HEPATIC FUNCTION PANEL
AG RATIO: 1.5 (calc) (ref 1.0–2.5)
ALKALINE PHOSPHATASE (APISO): 99 U/L (ref 33–115)
ALT: 36 U/L — ABNORMAL HIGH (ref 6–29)
AST: 30 U/L (ref 10–30)
Albumin: 4.3 g/dL (ref 3.6–5.1)
BILIRUBIN INDIRECT: 0.3 mg/dL (ref 0.2–1.2)
BILIRUBIN TOTAL: 0.4 mg/dL (ref 0.2–1.2)
Bilirubin, Direct: 0.1 mg/dL (ref 0.0–0.2)
Globulin: 2.8 g/dL (calc) (ref 1.9–3.7)
TOTAL PROTEIN: 7.1 g/dL (ref 6.1–8.1)

## 2017-11-10 LAB — VITAMIN D 25 HYDROXY (VIT D DEFICIENCY, FRACTURES): Vit D, 25-Hydroxy: 29 ng/mL — ABNORMAL LOW (ref 30–100)

## 2017-11-10 LAB — MAGNESIUM: MAGNESIUM: 1.9 mg/dL (ref 1.5–2.5)

## 2017-11-10 LAB — VITAMIN B12: Vitamin B-12: 492 pg/mL (ref 200–1100)

## 2017-11-24 ENCOUNTER — Encounter: Payer: Self-pay | Admitting: Physician Assistant

## 2017-11-24 MED ORDER — RIZATRIPTAN BENZOATE 10 MG PO TABS: ORAL_TABLET | ORAL | 2 refills | Status: DC

## 2017-12-09 ENCOUNTER — Other Ambulatory Visit: Payer: Self-pay | Admitting: Physician Assistant

## 2017-12-09 DIAGNOSIS — A084 Viral intestinal infection, unspecified: Secondary | ICD-10-CM

## 2017-12-14 ENCOUNTER — Encounter: Payer: Self-pay | Admitting: Physician Assistant

## 2017-12-14 ENCOUNTER — Telehealth: Payer: Self-pay | Admitting: Adult Health

## 2017-12-14 NOTE — Telephone Encounter (Signed)
LVM informing patient that the NP wasn't sure her chills are related to her headaches. Advised she monitor and if she doesn't get better, she should call her PCP. Left number for any further questions.

## 2017-12-14 NOTE — Telephone Encounter (Signed)
I am not sure that this is related to her headaches.  She may want to start with primary care for the symptoms.

## 2017-12-14 NOTE — Telephone Encounter (Signed)
Patient has had a headache x 3 days. She has taken Ibuprofen, aleve and  rizatriptan (MAXALT) 10 MG tablet which helps but headache comes back.  Yesterday she started getting chills on the left side of her body. She is not sure if she needs appointment. Please call and advise.

## 2017-12-14 NOTE — Telephone Encounter (Addendum)
Spoke with patient to discuss. She stated her headaches are "not that bad". She stated what is concerning her the most is the "chills off and on not associated with headaches and only on the left side of my body." this RN suggested she may have an infection of some type. She stated she checked her blood sugar which was ok, and she has had no fever, denies any other symptoms. She stated that right now, she doesn't have a headache. This RN advised will discuss with NP and call her back later today.  She verbalized understanding, appreciation.

## 2017-12-15 MED ORDER — PREDNISONE 20 MG PO TABS: ORAL_TABLET | ORAL | 0 refills | Status: DC

## 2017-12-22 ENCOUNTER — Encounter: Payer: Self-pay | Admitting: Physician Assistant

## 2017-12-22 DIAGNOSIS — R5383 Other fatigue: Secondary | ICD-10-CM

## 2017-12-22 MED ORDER — AMPHETAMINE-DEXTROAMPHETAMINE 10 MG PO TABS: 10.0000 mg | ORAL_TABLET | Freq: Two times a day (BID) | ORAL | 0 refills | Status: DC

## 2018-01-05 ENCOUNTER — Other Ambulatory Visit: Payer: Self-pay | Admitting: Physician Assistant

## 2018-01-11 ENCOUNTER — Ambulatory Visit: Payer: BLUE CROSS/BLUE SHIELD | Admitting: Adult Health

## 2018-01-11 ENCOUNTER — Encounter: Payer: Self-pay | Admitting: Adult Health

## 2018-01-11 VITALS — BP 124/84 | HR 88 | Ht 64.0 in | Wt 217.0 lb

## 2018-01-11 DIAGNOSIS — G43009 Migraine without aura, not intractable, without status migrainosus: Secondary | ICD-10-CM | POA: Diagnosis not present

## 2018-01-11 NOTE — Progress Notes (Signed)
PATIENT: Eileen Rios DOB: 25-May-1986  REASON FOR VISIT: follow up HISTORY FROM: patient  HISTORY OF PRESENT ILLNESS: Today 01/11/18 [name] is a 32 year old female with a history of migraine headaches.  She returns today for follow-up.  She reports that her headaches have been under relatively good control.  She states that her headaches tend to be more prevalent when she is on hormones.  She reports that she recently restarted Clomid as she and her husband are trying to conceive.  She reports that when she gets a headache Maxalt is beneficial.  She states several months back she had goosebumps that only occurred on the left side of the body.  She did see her PCP who gave her a prednisone Dosepak and the symptoms eventually resolved.  She denies any new symptoms.  She returns today for an evaluation.  HISTORY 07/13/17 [name] is a 32 year old female with a history of migraine headaches. She returns today for follow-up. She states that she is currently trying to conceive again. Reports that her primary care weaning her off of Effexor and Prozac. She states that she still uses the Imitrex injection if she has a severe headache. Patient states that she may have a slight headache every other day. She typically has to use the Imitrex injection 1-2 times a month. Her headaches typically occur across the forehead or around the neck. She does have photophobia, phonophobia nausea but no vomiting. She states that since she came off Effexor she has noted more anxiety and fatigue throughout the day. She reports that she does take Adderall helps some but she does notice the difference since this medication was discontinued. She returns today for an evaluation.   REVIEW OF SYSTEMS: Out of a complete 14 system review of symptoms, the patient complains only of the following symptoms, and all other reviewed systems are negative.  Fatigue, dizziness, headache, depression, nervous  ALLERGIES: No Known  Allergies  HOME MEDICATIONS: Outpatient Medications Prior to Visit  Medication Sig Dispense Refill  . ALPRAZolam (XANAX) 0.5 MG tablet TAKE 1/2 TO 1 TABLET TWICE DAILY AS NEEDED FOR ANXIETY 60 tablet 1  . amphetamine-dextroamphetamine (ADDERALL) 10 MG tablet Take 1 tablet (10 mg total) by mouth 2 (two) times daily with a meal. 60 tablet 0  . B-D INS SYR ULTRAFINE .5CC/30G 30G X 1/2" 0.5 ML MISC AS DIRECTED 90 each 1  . cetirizine (ZYRTEC) 10 MG tablet Take 10 mg by mouth daily as needed for allergies.    . Chlorphen-PE-Acetaminophen 4-10-325 MG TABS Take 1 tablet by mouth 3 (three) times daily as needed. 30 tablet 6  . clindamycin (CLINDAGEL) 1 % gel Apply 1 application topically 2 (two) times daily. 30 g 2  . labetalol (NORMODYNE) 200 MG tablet TAKE ONE TABLET TWICE DAILY 60 tablet 2  . metoCLOPramide (REGLAN) 10 MG tablet TAKE AT ONSET OF MIGRAINE -CAN TAKE EVERY 6 HOURS IF NEEDED 60 tablet 0  . nystatin (MYCOSTATIN/NYSTOP) powder APPLY DAILY AFTER A SHOWER AS NEEDED 60 g 1  . ondansetron (ZOFRAN) 4 MG tablet TAKE ONE TABLET DAILY AS NEEDED FOR NAUSEA OR VOMITING 30 tablet 1  . Prenatal MV-Min-Fe Fum-FA-DHA (PRENATAL 1 PO) Take by mouth.    Marland Kitchen PROCTOZONE-HC 2.5 % rectal cream APPLY RECTALLY TWICE DAILY AS NEEDED AFTER BOWEL MOVEMENT 30 g 0  . ranitidine (ZANTAC) 300 MG tablet TAKE ONE TABLET AT BEDTIME 30 tablet 6  . rizatriptan (MAXALT) 10 MG tablet May take 1 PO by  mouth at start of migraine, May repeat in 2 hours if needed 10 tablet 2  . sertraline (ZOLOFT) 50 MG tablet TAKE ONE TABLET DAILY 30 tablet 2  . SUMAtriptan (IMITREX) 6 MG/0.5ML SOLN injection 0.55mL SUBCUTANEOUSLY FOR HEADACHE MAY REPEAT IN 2 HOURS IF HEADACHE PERSISTS -NO MORE THEN 2 SHOTS IN 24 HOURS 8 mL 1  . predniSONE (DELTASONE) 20 MG tablet 2 tablets daily for 3 days, 1 tablet daily for 4 days. 10 tablet 0   No facility-administered medications prior to visit.     PAST MEDICAL HISTORY: Past Medical History:  Diagnosis  Date  . Anxiety   . Depression   . FEMALE INFERTILITY 12/13/2009   Qualifier: Diagnosis of  By: Huntley Dec, Scott    . Headache(784.0)    migraines  . Hypertension   . IBS (irritable bowel syndrome)   . Insulin resistance   . Seasonal allergies   . Urticaria     PAST SURGICAL HISTORY: Past Surgical History:  Procedure Laterality Date  . CESAREAN SECTION N/A 06/05/2013   Procedure: Primary cesarean section with delivery of baby boy at 1033.  Apgars 1/8.;  Surgeon: Genia Del, MD;  Location: [facility];  Service: Obstetrics;  Laterality: N/A;  . WISDOM TOOTH EXTRACTION      FAMILY HISTORY: Family History  Problem Relation Age of Onset  . Diabetes Mother   . Hypertension Father   . Migraines Neg Hx     SOCIAL HISTORY: Social History   Socioeconomic History  . Marital status: Married    Spouse name: Not on file  . Number of children: Not on file  . Years of education: Not on file  . Highest education level: Not on file  Occupational History  . Not on file  Social Needs  . Financial resource strain: Not on file  . Food insecurity:    Worry: Not on file    Inability: Not on file  . Transportation needs:    Medical: Not on file    Non-medical: Not on file  Tobacco Use  . Smoking status: Never Smoker  . Smokeless tobacco: Never Used  Substance and Sexual Activity  . Alcohol use: No  . Drug use: No  . Sexual activity: Yes  Lifestyle  . Physical activity:    Days per week: Not on file    Minutes per session: Not on file  . Stress: Not on file  Relationships  . Social connections:    Talks on phone: Not on file    Gets together: Not on file    Attends religious service: Not on file    Active member of club or organization: Not on file    Attends meetings of clubs or organizations: Not on file    Relationship status: Not on file  . Intimate partner violence:    Fear of current or ex partner: Not on file    Emotionally abused: Not on file    Physically  abused: Not on file    Forced sexual activity: Not on file  Other Topics Concern  . Not on file  Social History Narrative  . Not on file      PHYSICAL EXAM  Vitals:   01/11/18 0903  BP: 124/84  Pulse: 88  Weight: 217 lb (98.4 kg)  Height: 5\' 4"  (1.626 m)   Body mass index is 37.25 kg/m.  Generalized: Well developed, in no acute distress   Neurological examination  Mentation: Alert oriented to time, place, history taking.  Follows all commands speech and language fluent Cranial nerve II-XII: Pupils were equal round reactive to light. Extraocular movements were full, visual field were full on confrontational test. Facial sensation and strength were normal. Uvula tongue midline. Head turning and shoulder shrug  were normal and symmetric. Motor: The motor testing reveals 5 over 5 strength of all 4 extremities. Good symmetric motor tone is noted throughout.  Sensory: Sensory testing is intact to soft touch on all 4 extremities. No evidence of extinction is noted.  Coordination: Cerebellar testing reveals good finger-nose-finger and heel-to-shin bilaterally.  Gait and station: Gait is normal. Tandem gait is normal. Romberg is negative. No drift is seen.  Reflexes: Deep tendon reflexes are symmetric and normal bilaterally.   DIAGNOSTIC DATA (LABS, IMAGING, TESTING) - I reviewed patient records, labs, notes, testing and imaging myself where available.  Lab Results  Component Value Date   WBC 10.0 11/09/2017   HGB 13.4 11/09/2017   HCT 39.3 11/09/2017   MCV 86.6 11/09/2017   PLT 319 11/09/2017      Component Value Date/Time   NA 137 11/09/2017 1558   K 4.2 11/09/2017 1558   CL 105 11/09/2017 1558   CO2 25 11/09/2017 1558   GLUCOSE 83 11/09/2017 1558   BUN 15 11/09/2017 1558   CREATININE 0.89 11/09/2017 1558   CALCIUM 9.8 11/09/2017 1558   PROT 7.1 11/09/2017 1558   ALBUMIN 4.2 11/05/2016 1549   AST 30 11/09/2017 1558   ALT 36 (H) 11/09/2017 1558   ALKPHOS 84 11/05/2016  1549   BILITOT 0.4 11/09/2017 1558   GFRNONAA 86 11/09/2017 1558   GFRAA 100 11/09/2017 1558   Lab Results  Component Value Date   CHOL 191 11/09/2017   HDL 45 (L) 11/09/2017   LDLCALC 109 (H) 11/09/2017   TRIG 242 (H) 11/09/2017   CHOLHDL 4.2 11/09/2017   Lab Results  Component Value Date   HGBA1C 5.2 11/09/2017   Lab Results  Component Value Date   VITAMINB12 492 11/09/2017   Lab Results  Component Value Date   TSH 1.55 11/09/2017      ASSESSMENT AND PLAN 32 y.o. year old female  has a past medical history of Anxiety, Depression, FEMALE INFERTILITY (12/13/2009), Headache(784.0), Hypertension, IBS (irritable bowel syndrome), Insulin resistance, Seasonal allergies, and Urticaria. here with:  1.  Migraine headaches  Overall the patient has done well.  She denies any significant headaches.  She continues to use Maxalt with good benefit.  She is advised that if her symptoms worsen or she develops new symptoms she should let us know.  She will follow-up in 6 months or sooner if needed.  I spent 15 minutes with the patient. 50% of this time was spent discussing symptoms.   Butch PennyMegan Maeley Matton, MSN, NP-C 01/11/2018, 9:18 AM [facility] [address], [address] [place], [place] [address] [phone]

## 2018-01-11 NOTE — Patient Instructions (Signed)
Your Plan:  Continue maxalt as needed If your symptoms worsen or you develop new symptoms please let us know.   [facility]. I hope we have been able to provide you high quality care today.  You may receive a patient satisfaction survey over the next few weeks. We would appreciate your feedback and comments so that we may continue to improve ourselves and the health of our patients.

## 2018-01-11 NOTE — Progress Notes (Signed)
I have read the note, and I agree with the clinical assessment and plan.  Quientin Jent K Eline Geng   

## 2018-02-01 ENCOUNTER — Encounter: Payer: Self-pay | Admitting: Physician Assistant

## 2018-02-01 DIAGNOSIS — R5383 Other fatigue: Secondary | ICD-10-CM

## 2018-02-01 MED ORDER — AMPHETAMINE-DEXTROAMPHETAMINE 10 MG PO TABS: 10.0000 mg | ORAL_TABLET | Freq: Two times a day (BID) | ORAL | 0 refills | Status: DC

## 2018-02-01 MED ORDER — PREDNISONE 20 MG PO TABS: ORAL_TABLET | ORAL | 0 refills | Status: AC

## 2018-02-16 ENCOUNTER — Ambulatory Visit: Payer: BLUE CROSS/BLUE SHIELD | Admitting: Adult Health

## 2018-02-16 ENCOUNTER — Encounter: Payer: Self-pay | Admitting: Adult Health

## 2018-02-16 VITALS — BP 116/84 | HR 97 | Temp 97.3°F | Ht 64.0 in | Wt 217.0 lb

## 2018-02-16 DIAGNOSIS — J00 Acute nasopharyngitis [common cold]: Secondary | ICD-10-CM

## 2018-02-16 MED ORDER — PREDNISONE 20 MG PO TABS: ORAL_TABLET | ORAL | 0 refills | Status: DC

## 2018-02-16 MED ORDER — PROMETHAZINE-DM 6.25-15 MG/5ML PO SYRP: 5.0000 mL | ORAL_SOLUTION | Freq: Four times a day (QID) | ORAL | 1 refills | Status: DC | PRN

## 2018-02-16 MED ORDER — AZITHROMYCIN 250 MG PO TABS: ORAL_TABLET | ORAL | 1 refills | Status: AC

## 2018-02-16 NOTE — Patient Instructions (Signed)

## 2018-02-16 NOTE — Progress Notes (Signed)
Assessment and Plan:  Acute nasopharyngitis Benign exam, likely seasonal allergies + mild virus - Discussed the importance of avoiding unnecessary antibiotic therapy. Suggested symptomatic OTC remedies. Nasal saline spray for congestion. Nasal steroids, allergy pill, oral steroids Follow up as needed. -     promethazine-dextromethorphan (PROMETHAZINE-DM) 6.25-15 MG/5ML syrup; Take 5 mLs by mouth 4 (four) times daily as needed for cough. -     predniSONE (DELTASONE) 20 MG tablet; 2 tablets daily for 3 days, 1 tablet daily for 4 days.  Hold and take only if develop fever or severe progressive symptoms:  -     azithromycin (ZITHROMAX) 250 MG tablet; Take 2 tablets (500 mg) on  Day 1,  followed by 1 tablet (250 mg) once daily on Days 2 through 5.  Further disposition pending results of labs. Discussed med's effects and SE's.   Over 15 minutes of exam, counseling, chart review, and critical decision making was performed.   Future Appointments  Date Time Provider Department Center  03/09/2018  9:30 AM Judd Gaudier, NP [facility] None  07/21/2018  9:30 AM Butch Penny, NP [facility] None  11/18/2018  3:00 PM Quentin Mulling, PA-C [facility] None    ------------------------------------------------------------------------------------------------------------------   HPI BP 116/84   Pulse 97   Temp (!) 97.3 F (36.3 C)   Ht  (1.626 m)   Wt 217 lb (98.4 kg)   SpO2 98%   BMI 37.25 kg/m   31 y.o.female who presents for evaluation of symptoms of a URI. Symptoms include- intermittent sore throat, scratchy throat, newly productive cough (yellow discharge), some nasal discharge/congestion, sneezing, generalized facial pressure/mild headache. She reports she had a brief dizzy episode. Denies vision changes, nausea. Onset of symptoms was 7 days ago, and were getting worse until day 3 but has been stable since that time. Treatment to date: antihistamines and decongestants. She also does saline  nasal irrigation. She has taken sudafed.   Past Medical History:  Diagnosis Date  . Anxiety   . Depression   . FEMALE INFERTILITY 12/13/2009   Qualifier: Diagnosis of  By: Huntley Dec, Scott    . Headache(784.0)    migraines  . Hypertension   . IBS (irritable bowel syndrome)   . Insulin resistance   . Seasonal allergies   . Urticaria      No Known Allergies  Current Outpatient Medications on File Prior to Visit  Medication Sig  . ALPRAZolam (XANAX) 0.5 MG tablet TAKE 1/2 TO 1 TABLET TWICE DAILY AS NEEDED FOR ANXIETY  . amphetamine-dextroamphetamine (ADDERALL) 10 MG tablet Take 1 tablet (10 mg total) by mouth 2 (two) times daily with a meal.  . B-D INS SYR ULTRAFINE .5CC/30G 30G X 1/2" 0.5 ML MISC AS DIRECTED  . cetirizine (ZYRTEC) 10 MG tablet Take 10 mg by mouth daily as needed for allergies.  . Chlorphen-PE-Acetaminophen 4-10-325 MG TABS Take 1 tablet by mouth 3 (three) times daily as needed.  . clindamycin (CLINDAGEL) 1 % gel Apply 1 application topically 2 (two) times daily.  Marland Kitchen labetalol (NORMODYNE) 200 MG tablet TAKE ONE TABLET TWICE DAILY  . metoCLOPramide (REGLAN) 10 MG tablet TAKE AT ONSET OF MIGRAINE -CAN TAKE EVERY 6 HOURS IF NEEDED  . nystatin (MYCOSTATIN/NYSTOP) powder APPLY DAILY AFTER A SHOWER AS NEEDED  . ondansetron (ZOFRAN) 4 MG tablet TAKE ONE TABLET DAILY AS NEEDED FOR NAUSEA OR VOMITING  . Prenatal MV-Min-Fe Fum-FA-DHA (PRENATAL 1 PO) Take by mouth.  Marland Kitchen PROCTOZONE-HC 2.5 % rectal cream APPLY RECTALLY TWICE DAILY AS NEEDED  AFTER BOWEL MOVEMENT  . ranitidine (ZANTAC) 300 MG tablet TAKE ONE TABLET AT BEDTIME  . rizatriptan (MAXALT) 10 MG tablet May take 1 PO by mouth at start of migraine, May repeat in 2 hours if needed  . sertraline (ZOLOFT) 50 MG tablet TAKE ONE TABLET DAILY  . SUMAtriptan (IMITREX) 6 MG/0.5ML SOLN injection 0.67mL SUBCUTANEOUSLY FOR HEADACHE MAY REPEAT IN 2 HOURS IF HEADACHE PERSISTS -NO MORE THEN 2 SHOTS IN 24 HOURS  . [DISCONTINUED]  Drospirenone-Ethinyl Estradiol-Levomefol (SAFYRAL) 3-0.03-0.451 MG tablet Take 1 tablet by mouth daily.   No current facility-administered medications on file prior to visit.     ROS: Review of Systems  Constitutional: Negative for chills, diaphoresis, fever and malaise/fatigue.  HENT: Positive for congestion and sore throat. Negative for ear discharge, ear pain, hearing loss, sinus pain and tinnitus.   Eyes: Negative for blurred vision, pain, discharge and redness.  Respiratory: Positive for cough and sputum production. Negative for hemoptysis, shortness of breath, wheezing and stridor.   Cardiovascular: Negative for chest pain, palpitations and orthopnea.  Gastrointestinal: Negative for abdominal pain, diarrhea, nausea and vomiting.  Genitourinary: Negative.   Musculoskeletal: Negative for joint pain and myalgias.  Skin: Negative for rash.  Neurological: Positive for headaches. Negative for dizziness, sensory change and weakness.  Endo/Heme/Allergies: Negative for environmental allergies.  Psychiatric/Behavioral: Negative.   All other systems reviewed and are negative.   Physical Exam:  BP 116/84   Pulse 97   Temp (!) 97.3 F (36.3 C)   Ht  (1.626 m)   Wt 217 lb (98.4 kg)   SpO2 98%   BMI 37.25 kg/m   General Appearance: Well nourished, in no apparent distress. Eyes: PERRLA, EOMs, conjunctiva no swelling or erythema Sinuses: No Frontal/maxillary tenderness ENT/Mouth: Ext aud canals clear, TMs without erythema, bulging. No erythema, swelling, or exudate on post pharynx.  Tonsils not swollen or erythematous. Hearing normal.  Neck: Supple, thyroid normal.  Respiratory: Respiratory effort normal, BS equal bilaterally without rales, rhonchi, wheezing or stridor.  Cardio: RRR with no MRGs. Brisk peripheral pulses without edema.  Abdomen: Soft, + BS.  Non tender, no guarding, rebound, hernias, masses. Lymphatics: Non tender without lymphadenopathy.  Musculoskeletal:  Symmetrical strength, normal gait.  Skin: Warm, dry without rashes, lesions, ecchymosis.  Neuro: Cranial nerves intact. Normal muscle tone, no cerebellar symptoms. Sensation intact.  Psych: Awake and oriented X 3, normal affect, Insight and Judgment appropriate.     Dan Maker, NP 3:28 PM [facility]

## 2018-03-08 DIAGNOSIS — E785 Hyperlipidemia, unspecified: Secondary | ICD-10-CM | POA: Insufficient documentation

## 2018-03-08 NOTE — Progress Notes (Signed)
FOLLOW UP  Assessment and Plan:   Hypertension Well controlled at this time Monitor blood pressure at home; patient to call if consistently greater than 130/80 Continue DASH diet.   Reminder to go to the ER if any CP, SOB, nausea, dizziness, severe HA, changes vision/speech, left arm numbness and tingling and jaw pain.  Cholesterol Currently borderline elevated; treated by lifestyle only at this time Continue low cholesterol diet and exercise.  Check lipid panel.   Obesity with co morbidities Long discussion about weight loss, diet, and exercise Recommended diet heavy in fruits and veggies and low in animal meats, cheeses, and dairy products, appropriate calorie intake Discussed ideal weight for height  Will follow up in 3 months  Anxiety Well managed by current regimen; continue medications Stress management techniques discussed, increase water, good sleep hygiene discussed, increase exercise, and increase veggies.   Right sided sciatica Not resolving with NSAIDs, steroids, though does improve with stretching and steroids Will refer to PT for back exercises and core strengthening Obtain imaging/ Refer to ortho if not resolving  Continue diet and meds as discussed. Further disposition pending results of labs. Discussed med's effects and SE's.   Over 30 minutes of exam, counseling, chart review, and critical decision making was performed.   Future Appointments  Date Time Provider Department Center  07/21/2018  9:30 AM Butch Penny, NP [facility] None  11/18/2018  3:00 PM Quentin Mulling, PA-C [facility] None    ----------------------------------------------------------------------------------------------------------------------  HPI 32 y.o. female  presents for 3 month follow up on hypertension, obesity, anxiety and insomnia. She has had a negative sleep study other than she was not hitting REM sleep, she was tried on nuvigil but could not tolerate it, and has been on  adderall that has helped. She is seeing a therapist every 2 weeks that she is helping. Older son Andres Ege is 4, she is actively trying to conceive, working with GYN.  She is on labetolol.   she has a diagnosis of anxiety and is currently on zoloft 50 mg daily, reports symptoms are well controlled on current regimen. She is also prescribed xanax, takes 0.25 mg in the evening and rarely during the day after stressful events (recently after funerals).   She reports typically intermittent rare sciatica of right side without notable back pain seems to be worse/more frequent in the past months. She reports pain resolves while taking prednisone, and improves with back exercises and stretches that have been provided but does not fully resolve as it used to.   BMI is Body mass index is 37.76 kg/m., she has been working on diet and exercise - [facility]. She reports she is struggling with diet - she reports she does well while cooking at home, but has been eating out more due to her schedule.  Wt Readings from Last 3 Encounters:  03/09/18 220 lb (99.8 kg)  02/16/18 217 lb (98.4 kg)  01/11/18 217 lb (98.4 kg)   Her blood pressure has been controlled at home, today their BP is BP: 110/78  She does workout. She denies chest pain, shortness of breath, dizziness.   She is not on cholesterol medication and denies myalgias. Her cholesterol is not at goal. The cholesterol last visit was:   Lab Results  Component Value Date   CHOL 191 11/09/2017   HDL 45 (L) 11/09/2017   LDLCALC 109 (H) 11/09/2017   TRIG 242 (H) 11/09/2017   CHOLHDL 4.2 11/09/2017  She has been working on diet and exercise for glucose management, and denies foot ulcerations, increased appetite, nausea, paresthesia of the feet, polydipsia, polyuria, visual disturbances, vomiting and weight loss. Last A1C in the office was:  Lab Results  Component Value Date   HGBA1C 5.2 11/09/2017      Current  Medications:  Current Outpatient Medications on File Prior to Visit  Medication Sig  . ALPRAZolam (XANAX) 0.5 MG tablet TAKE 1/2 TO 1 TABLET TWICE DAILY AS NEEDED FOR ANXIETY  . amphetamine-dextroamphetamine (ADDERALL) 10 MG tablet Take 1 tablet (10 mg total) by mouth 2 (two) times daily with a meal.  . B-D INS SYR ULTRAFINE .5CC/30G 30G X 1/2" 0.5 ML MISC AS DIRECTED  . cetirizine (ZYRTEC) 10 MG tablet Take 10 mg by mouth daily as needed for allergies.  . Chlorphen-PE-Acetaminophen 4-10-325 MG TABS Take 1 tablet by mouth 3 (three) times daily as needed.  . clindamycin (CLINDAGEL) 1 % gel Apply 1 application topically 2 (two) times daily.  Marland Kitchen. labetalol (NORMODYNE) 200 MG tablet TAKE ONE TABLET TWICE DAILY  . metoCLOPramide (REGLAN) 10 MG tablet TAKE AT ONSET OF MIGRAINE -CAN TAKE EVERY 6 HOURS IF NEEDED  . nystatin (MYCOSTATIN/NYSTOP) powder APPLY DAILY AFTER A SHOWER AS NEEDED  . ondansetron (ZOFRAN) 4 MG tablet TAKE ONE TABLET DAILY AS NEEDED FOR NAUSEA OR VOMITING  . Prenatal MV-Min-Fe Fum-FA-DHA (PRENATAL 1 PO) Take by mouth.  Marland Kitchen. PROCTOZONE-HC 2.5 % rectal cream APPLY RECTALLY TWICE DAILY AS NEEDED AFTER BOWEL MOVEMENT  . promethazine-dextromethorphan (PROMETHAZINE-DM) 6.25-15 MG/5ML syrup Take 5 mLs by mouth 4 (four) times daily as needed for cough.  . ranitidine (ZANTAC) 300 MG tablet TAKE ONE TABLET AT BEDTIME (Patient taking differently: TAKE TWICE TABLET AT BEDTIME)  . rizatriptan (MAXALT) 10 MG tablet May take 1 PO by mouth at start of migraine, May repeat in 2 hours if needed  . sertraline (ZOLOFT) 50 MG tablet TAKE ONE TABLET DAILY  . SUMAtriptan (IMITREX) 6 MG/0.5ML SOLN injection 0.365mL SUBCUTANEOUSLY FOR HEADACHE MAY REPEAT IN 2 HOURS IF HEADACHE PERSISTS -NO MORE THEN 2 SHOTS IN 24 HOURS  . predniSONE (DELTASONE) 20 MG tablet 2 tablets daily for 3 days, 1 tablet daily for 4 days. (Patient not taking: Reported on 03/09/2018)  . [DISCONTINUED] Drospirenone-Ethinyl Estradiol-Levomefol  (SAFYRAL) 3-0.03-0.451 MG tablet Take 1 tablet by mouth daily.   No current facility-administered medications on file prior to visit.      Allergies: No Known Allergies   Medical History:  Past Medical History:  Diagnosis Date  . Anxiety   . Depression   . FEMALE INFERTILITY 12/13/2009   Qualifier: Diagnosis of  By: Huntley DecWeaver PA-C, Scott    . Headache(784.0)    migraines  . Hypertension   . IBS (irritable bowel syndrome)   . Insulin resistance   . Seasonal allergies   . Urticaria    Family history- Reviewed and unchanged Social history- Reviewed and unchanged   Review of Systems:  Review of Systems  Constitutional: Negative for malaise/fatigue and weight loss.  HENT: Negative for hearing loss and tinnitus.   Eyes: Negative for blurred vision and double vision.  Respiratory: Negative for cough, shortness of breath and wheezing.   Cardiovascular: Negative for chest pain, palpitations, orthopnea, claudication and leg swelling.  Gastrointestinal: Negative for abdominal pain, blood in stool, constipation, diarrhea, heartburn, melena, nausea and vomiting.  Genitourinary: Negative.   Musculoskeletal: Positive for back pain (right sciatica). Negative for joint pain and myalgias.  Skin: Negative for rash.  Neurological: Negative for dizziness, tingling, sensory change, weakness and headaches.  Endo/Heme/Allergies: Negative for polydipsia.  Psychiatric/Behavioral: Negative.   All other systems reviewed and are negative.     Physical Exam: BP 110/78   Pulse 93   Temp (!) 97.3 F (36.3 C)   Ht 5\' 4"  (1.626 m)   Wt 220 lb (99.8 kg)   LMP 03/04/2018   SpO2 97%   BMI 37.76 kg/m  Wt Readings from Last 3 Encounters:  03/09/18 220 lb (99.8 kg)  02/16/18 217 lb (98.4 kg)  01/11/18 217 lb (98.4 kg)   General Appearance: Well nourished, in no apparent distress. Eyes: PERRLA, EOMs, conjunctiva no swelling or erythema Sinuses: No Frontal/maxillary tenderness ENT/Mouth: Ext aud  canals clear, TMs without erythema, bulging. No erythema, swelling, or exudate on post pharynx.  Tonsils not swollen or erythematous. Hearing normal.  Neck: Supple, thyroid normal.  Respiratory: Respiratory effort normal, BS equal bilaterally without rales, rhonchi, wheezing or stridor.  Cardio: RRR with no MRGs. Brisk peripheral pulses without edema.  Abdomen: Soft, + BS.  Non tender, no guarding, rebound, hernias, masses. Lymphatics: Non tender without lymphadenopathy.  Musculoskeletal: Full ROM, 5/5 strength, Normal gait Skin: Warm, dry without rashes, lesions, ecchymosis.  Neuro: Cranial nerves intact. No cerebellar symptoms.  Psych: Awake and oriented X 3, normal affect, Insight and Judgment appropriate.    Dan Maker, NP 9:41 AM [facility]

## 2018-03-09 ENCOUNTER — Encounter: Payer: Self-pay | Admitting: Adult Health

## 2018-03-09 ENCOUNTER — Other Ambulatory Visit: Payer: Self-pay | Admitting: Physician Assistant

## 2018-03-09 ENCOUNTER — Ambulatory Visit: Payer: BLUE CROSS/BLUE SHIELD | Admitting: Adult Health

## 2018-03-09 VITALS — BP 110/78 | HR 93 | Temp 97.3°F | Ht 64.0 in | Wt 220.0 lb

## 2018-03-09 DIAGNOSIS — O132 Gestational [pregnancy-induced] hypertension without significant proteinuria, second trimester: Secondary | ICD-10-CM

## 2018-03-09 DIAGNOSIS — M5431 Sciatica, right side: Secondary | ICD-10-CM | POA: Diagnosis not present

## 2018-03-09 DIAGNOSIS — G43809 Other migraine, not intractable, without status migrainosus: Secondary | ICD-10-CM

## 2018-03-09 DIAGNOSIS — Z79899 Other long term (current) drug therapy: Secondary | ICD-10-CM | POA: Diagnosis not present

## 2018-03-09 DIAGNOSIS — F411 Generalized anxiety disorder: Secondary | ICD-10-CM

## 2018-03-09 DIAGNOSIS — G4700 Insomnia, unspecified: Secondary | ICD-10-CM

## 2018-03-09 DIAGNOSIS — E669 Obesity, unspecified: Secondary | ICD-10-CM | POA: Diagnosis not present

## 2018-03-09 DIAGNOSIS — E782 Mixed hyperlipidemia: Secondary | ICD-10-CM | POA: Diagnosis not present

## 2018-03-09 MED ORDER — ALPRAZOLAM 0.5 MG PO TABS: ORAL_TABLET | ORAL | 0 refills | Status: DC

## 2018-03-09 MED ORDER — LABETALOL HCL 200 MG PO TABS: 200.0000 mg | ORAL_TABLET | Freq: Two times a day (BID) | ORAL | 2 refills | Status: DC

## 2018-03-09 MED ORDER — RANITIDINE HCL 300 MG PO TABS: ORAL_TABLET | ORAL | 6 refills | Status: DC

## 2018-03-09 NOTE — Patient Instructions (Signed)
Aim for 7+ servings of fruits and vegetables daily  80+ fluid ounces of water or unsweet tea for healthy kidneys  Limit alcohol intake  Limit animal fats in diet for cholesterol and heart health - choose grass fed whenever available  Aim for low stress - take time to unwind and care for your mental health  Aim for 150 min of moderate intensity exercise weekly for heart health, and weights twice weekly for bone health  Aim for 7-9 hours of sleep daily    Sciatica Sciatica is pain, numbness, weakness, or tingling along the path of the sciatic nerve. The sciatic nerve starts in the lower back and runs down the back of each leg. The nerve controls the muscles in the lower leg and in the back of the knee. It also provides feeling (sensation) to the back of the thigh, the lower leg, and the sole of the foot. Sciatica is a symptom of another medical condition that pinches or puts pressure on the sciatic nerve. Generally, sciatica only affects one side of the body. Sciatica usually goes away on its own or with treatment. In some cases, sciatica may keep coming back (recur). What are the causes? This condition is caused by pressure on the sciatic nerve, or pinching of the sciatic nerve. This may be the result of:  A disk in between the bones of the spine (vertebrae) bulging out too far (herniated disk).  Age-related changes in the spinal disks (degenerative disk disease).  A pain disorder that affects a muscle in the buttock (piriformis syndrome).  Extra bone growth (bone spur) near the sciatic nerve.  An injury or break (fracture) of the pelvis.  Pregnancy.  Tumor (rare).  What increases the risk? The following factors may make you more likely to develop this condition:  Playing sports that place pressure or stress on the spine, such as football or weight lifting.  Having poor strength and flexibility.  A history of back injury.  A history of back surgery.  Sitting for long  periods of time.  Doing activities that involve repetitive bending or lifting.  Obesity.  What are the signs or symptoms? Symptoms can vary from mild to very severe, and they may include:  Any of these problems in the lower back, leg, hip, or buttock: ? Mild tingling or dull aches. ? Burning sensations. ? Sharp pains.  Numbness in the back of the calf or the sole of the foot.  Leg weakness.  Severe back pain that makes movement difficult.  These symptoms may get worse when you cough, sneeze, or laugh, or when you sit or stand for long periods of time. Being overweight may also make symptoms worse. In some cases, symptoms may recur over time. How is this diagnosed? This condition may be diagnosed based on:  Your symptoms.  A physical exam. Your health care provider may ask you to do certain movements to check whether those movements trigger your symptoms.  You may have tests, including: ? Blood tests. ? X-rays. ? MRI. ? CT scan.  How is this treated? In many cases, this condition improves on its own, without any treatment. However, treatment may include:  Reducing or modifying physical activity during periods of pain.  Exercising and stretching to strengthen your abdomen and improve the flexibility of your spine.  Icing and applying heat to the affected area.  Medicines that help: ? To relieve pain and swelling. ? To relax your muscles.  Injections of medicines that help to relieve  pain, irritation, and inflammation around the sciatic nerve (steroids).  Surgery.  Follow these instructions at home: Medicines  Take over-the-counter and prescription medicines only as told by your health care provider.  Do not drive or operate heavy machinery while taking prescription pain medicine. Managing pain  If directed, apply ice to the affected area. ? Put ice in a plastic bag. ? Place a towel between your skin and the bag. ? Leave the ice on for 20 minutes, 2-3 times  a day.  After icing, apply heat to the affected area before you exercise or as often as told by your health care provider. Use the heat source that your health care provider recommends, such as a moist heat pack or a heating pad. ? Place a towel between your skin and the heat source. ? Leave the heat on for 20-30 minutes. ? Remove the heat if your skin turns bright red. This is especially important if you are unable to feel pain, heat, or cold. You may have a greater risk of getting burned. Activity  Return to your normal activities as told by your health care provider. Ask your health care provider what activities are safe for you. ? Avoid activities that make your symptoms worse.  Take brief periods of rest throughout the day. Resting in a lying or standing position is usually better than sitting to rest. ? When you rest for longer periods, mix in some mild activity or stretching between periods of rest. This will help to prevent stiffness and pain. ? Avoid sitting for long periods of time without moving. Get up and move around at least one time each hour.  Exercise and stretch regularly, as told by your health care provider.  Do not lift anything that is heavier than 10 lb (4.5 kg) while you have symptoms of sciatica. When you do not have symptoms, you should still avoid heavy lifting, especially repetitive heavy lifting.  When you lift objects, always use proper lifting technique, which includes: ? Bending your knees. ? Keeping the load close to your body. ? Avoiding twisting. General instructions  Use good posture. ? Avoid leaning forward while sitting. ? Avoid hunching over while standing.  Maintain a healthy weight. Excess weight puts extra stress on your back and makes it difficult to maintain good posture.  Wear supportive, comfortable shoes. Avoid wearing high heels.  Avoid sleeping on a mattress that is too soft or too hard. A mattress that is firm enough to support your  back when you sleep may help to reduce your pain.  Keep all follow-up visits as told by your health care provider. This is important. Contact a health care provider if:  You have pain that wakes you up when you are sleeping.  You have pain that gets worse when you lie down.  Your pain is worse than you have experienced in the past.  Your pain lasts longer than 4 weeks.  You experience unexplained weight loss. Get help right away if:  You lose control of your bowel or bladder (incontinence).  You have: ? Weakness in your lower back, pelvis, buttocks, or legs that gets worse. ? Redness or swelling of your back. ? A burning sensation when you urinate. This information is not intended to replace advice given to you by your health care provider. Make sure you discuss any questions you have with your health care provider. Document Released: 09/16/2001 Document Revised: 02/26/2016 Document Reviewed: 06/01/2015 Elsevier Interactive Patient Education  [employer].

## 2018-03-10 LAB — TSH: TSH: 1.38 mIU/L

## 2018-03-10 LAB — COMPLETE METABOLIC PANEL WITH GFR
AG RATIO: 1.5 (calc) (ref 1.0–2.5)
ALBUMIN MSPROF: 4.3 g/dL (ref 3.6–5.1)
ALT: 69 U/L — ABNORMAL HIGH (ref 6–29)
AST: 58 U/L — ABNORMAL HIGH (ref 10–30)
Alkaline phosphatase (APISO): 95 U/L (ref 33–115)
BUN: 14 mg/dL (ref 7–25)
CALCIUM: 9.7 mg/dL (ref 8.6–10.2)
CO2: 24 mmol/L (ref 20–32)
Chloride: 107 mmol/L (ref 98–110)
Creat: 0.96 mg/dL (ref 0.50–1.10)
GFR, EST AFRICAN AMERICAN: 91 mL/min/{1.73_m2} (ref >=60)
GFR, EST NON AFRICAN AMERICAN: 79 mL/min/{1.73_m2} (ref >=60)
GLOBULIN: 2.8 g/dL (ref 1.9–3.7)
Glucose, Bld: 78 mg/dL (ref 65–99)
POTASSIUM: 4.7 mmol/L (ref 3.5–5.3)
SODIUM: 138 mmol/L (ref 135–146)
TOTAL PROTEIN: 7.1 g/dL (ref 6.1–8.1)
Total Bilirubin: 0.6 mg/dL (ref 0.2–1.2)

## 2018-03-10 LAB — CBC WITH DIFFERENTIAL/PLATELET
BASOS PCT: 0.8 %
Basophils Absolute: 59 cells/uL (ref 0–200)
Eosinophils Absolute: 200 cells/uL (ref 15–500)
Eosinophils Relative: 2.7 %
HEMATOCRIT: 37.9 % (ref 35.0–45.0)
Hemoglobin: 12.9 g/dL (ref 11.7–15.5)
LYMPHS ABS: 2257 {cells}/uL (ref 850–3900)
MCH: 30.2 pg (ref 27.0–33.0)
MCHC: 34 g/dL (ref 32.0–36.0)
MCV: 88.8 fL (ref 80.0–100.0)
MPV: 9.8 fL (ref 7.5–12.5)
Monocytes Relative: 10.1 %
NEUTROS PCT: 55.9 %
Neutro Abs: 4137 cells/uL (ref 1500–7800)
Platelets: 340 10*3/uL (ref 140–400)
RBC: 4.27 10*6/uL (ref 3.80–5.10)
RDW: 13 % (ref 11.0–15.0)
Total Lymphocyte: 30.5 %
WBC mixed population: 747 cells/uL (ref 200–950)
WBC: 7.4 10*3/uL (ref 3.8–10.8)

## 2018-03-10 LAB — LIPID PANEL
Cholesterol: 216 mg/dL — ABNORMAL HIGH (ref <200)
HDL: 44 mg/dL — ABNORMAL LOW (ref >50)
LDL Cholesterol (Calc): 134 mg/dL (calc) — ABNORMAL HIGH
NON-HDL CHOLESTEROL (CALC): 172 mg/dL — AB (ref <130)
Total CHOL/HDL Ratio: 4.9 (calc) (ref <5.0)
Triglycerides: 238 mg/dL — ABNORMAL HIGH (ref <150)

## 2018-03-25 ENCOUNTER — Other Ambulatory Visit: Payer: Self-pay | Admitting: Adult Health

## 2018-03-25 ENCOUNTER — Encounter: Payer: Self-pay | Admitting: Adult Health

## 2018-03-25 MED ORDER — RANITIDINE HCL 300 MG PO TABS: 300.0000 mg | ORAL_TABLET | Freq: Two times a day (BID) | ORAL | 6 refills | Status: DC

## 2018-03-29 ENCOUNTER — Other Ambulatory Visit: Payer: Self-pay | Admitting: Adult Health

## 2018-03-29 ENCOUNTER — Encounter: Payer: Self-pay | Admitting: Adult Health

## 2018-03-29 DIAGNOSIS — R5383 Other fatigue: Secondary | ICD-10-CM

## 2018-03-29 MED ORDER — AMPHETAMINE-DEXTROAMPHETAMINE 10 MG PO TABS: 10.0000 mg | ORAL_TABLET | Freq: Two times a day (BID) | ORAL | 0 refills | Status: DC

## 2018-04-10 ENCOUNTER — Other Ambulatory Visit: Payer: Self-pay | Admitting: Physician Assistant

## 2018-04-20 ENCOUNTER — Encounter: Payer: Self-pay | Admitting: Adult Health

## 2018-04-20 ENCOUNTER — Other Ambulatory Visit: Payer: Self-pay | Admitting: Adult Health

## 2018-04-20 MED ORDER — PREDNISONE 20 MG PO TABS: ORAL_TABLET | ORAL | 0 refills | Status: DC

## 2018-05-17 ENCOUNTER — Other Ambulatory Visit: Payer: Self-pay

## 2018-05-17 DIAGNOSIS — R5383 Other fatigue: Secondary | ICD-10-CM

## 2018-05-17 MED ORDER — AMPHETAMINE-DEXTROAMPHETAMINE 10 MG PO TABS: 10.0000 mg | ORAL_TABLET | Freq: Two times a day (BID) | ORAL | 0 refills | Status: DC

## 2018-05-17 NOTE — Telephone Encounter (Signed)
Adderall refill request. PMP checked, last filled on 03/29/18. In que for your review.

## 2018-06-09 ENCOUNTER — Other Ambulatory Visit: Payer: Self-pay | Admitting: Internal Medicine

## 2018-06-09 ENCOUNTER — Other Ambulatory Visit: Payer: Self-pay | Admitting: Adult Health

## 2018-06-09 DIAGNOSIS — F411 Generalized anxiety disorder: Secondary | ICD-10-CM

## 2018-06-28 ENCOUNTER — Other Ambulatory Visit: Payer: Self-pay | Admitting: Physician Assistant

## 2018-06-28 DIAGNOSIS — R5383 Other fatigue: Secondary | ICD-10-CM

## 2018-06-28 MED ORDER — AMPHETAMINE-DEXTROAMPHETAMINE 10 MG PO TABS: 10.0000 mg | ORAL_TABLET | Freq: Two times a day (BID) | ORAL | 0 refills | Status: DC

## 2018-07-08 ENCOUNTER — Other Ambulatory Visit: Payer: Self-pay | Admitting: Internal Medicine

## 2018-07-08 DIAGNOSIS — E559 Vitamin D deficiency, unspecified: Secondary | ICD-10-CM | POA: Insufficient documentation

## 2018-07-08 DIAGNOSIS — K219 Gastro-esophageal reflux disease without esophagitis: Secondary | ICD-10-CM | POA: Insufficient documentation

## 2018-07-08 NOTE — Progress Notes (Signed)
FOLLOW UP  Assessment and Plan:   Hypertension Well controlled at this time Monitor blood pressure at home; patient to call if consistently greater than 130/80 Continue DASH diet.   Reminder to go to the ER if any CP, SOB, nausea, dizziness, severe HA, changes vision/speech, left arm numbness and tingling and jaw pain.  Cholesterol Currently borderline elevated; treated by lifestyle only at this time Continue low cholesterol diet and exercise.  Check lipid panel.   Obesity with co morbidities Long discussion about weight loss, diet, and exercise Recommended diet heavy in fruits and veggies and low in animal meats, cheeses, and dairy products, appropriate calorie intake Discussed ideal weight for height, first weight goal to get <200 lb On metformin by GYN for weight loss prior to conception Patient will work on increasing vegetable intake, increasing water, restart swimming Will follow up in 3 months  Anxiety Well managed by current regimen; continue medications Stress management techniques discussed, increase water, good sleep hygiene discussed, increase exercise, and increase veggies.    Continue diet and meds as discussed. Further disposition pending results of labs. Discussed med's effects and SE's.   Over 30 minutes of exam, counseling, chart review, and critical decision making was performed.   Future Appointments  Date Time Provider Department Center  07/21/2018  9:30 AM Butch Penny, NP [facility] None  11/18/2018  3:00 PM Quentin Mulling, PA-C [facility] None    ----------------------------------------------------------------------------------------------------------------------  HPI 32 y.o. female  presents for 3 month follow up on hypertension, obesity, anxiety and insomnia. She has had a negative sleep study other than she was not hitting REM sleep, she was tried on nuvigil but could not tolerate it, and has been on adderall that has helped.   Older son Andres Ege is  4, thinking about trying in the next few months and working with GYN.  She is on labetolol.   she has a diagnosis of anxiety and is currently on zoloft 50 mg daily, reports symptoms are well controlled on current regimen. She is also prescribed xanax, takes 0.25 mg in the evening and rarely during the day after stressful events (last week after a funeral).   BMI is Body mass index is 36.9 kg/m., she has been working on diet and exercise - [facility] but hasn't had a chance to with recent funeral.  Wt Readings from Last 3 Encounters:  07/12/18 215 lb (97.5 kg)  03/09/18 220 lb (99.8 kg)  02/16/18 217 lb (98.4 kg)   Her blood pressure has been controlled at home, today their BP is BP: 110/80  She does workout. She denies chest pain, shortness of breath, dizziness.   She is not on cholesterol medication and denies myalgias. Her cholesterol is not at goal. The cholesterol last visit was:   Lab Results  Component Value Date   CHOL 216 (H) 03/09/2018   HDL 44 (L) 03/09/2018   LDLCALC 134 (H) 03/09/2018   TRIG 238 (H) 03/09/2018   CHOLHDL 4.9 03/09/2018    She has been working on diet and exercise for glucose management, and denies foot ulcerations, increased appetite, nausea, paresthesia of the feet, polydipsia, polyuria, visual disturbances, vomiting and weight loss. Last A1C in the office was:  Lab Results  Component Value Date   HGBA1C 5.2 11/09/2017   Patient is on Vitamin D supplement, taking 5000 IU daily   Lab Results  Component Value Date   VD25OH 29 (L) 11/09/2017  Current Medications:  Current Outpatient Medications on File Prior to Visit  Medication Sig  . ALPRAZolam (XANAX) 0.5 MG tablet TAKE ONE-HALF TO ONE TABLET TWICE DAILY AS NEEDED FOR ANXIETY  . amphetamine-dextroamphetamine (ADDERALL) 10 MG tablet Take 1 tablet (10 mg total) by mouth 2 (two) times daily with a meal.  . B-D INS SYR ULTRAFINE .5CC/30G 30G X 1/2" 0.5 ML MISC  AS DIRECTED  . cetirizine (ZYRTEC) 10 MG tablet Take 10 mg by mouth daily as needed for allergies.  . Chlorphen-PE-Acetaminophen 4-10-325 MG TABS Take 1 tablet by mouth 3 (three) times daily as needed.  . clindamycin (CLINDAGEL) 1 % gel Apply 1 application topically 2 (two) times daily.  Marland Kitchen labetalol (NORMODYNE) 200 MG tablet TAKE ONE TABLET TWICE DAILY  . metFORMIN (GLUCOPHAGE) 500 MG tablet Take 500 mg by mouth every evening.  . nystatin (MYCOSTATIN/NYSTOP) powder APPLY DAILY AFTER A SHOWER AS NEEDED  . ondansetron (ZOFRAN) 4 MG tablet TAKE ONE TABLET DAILY AS NEEDED FOR NAUSEA OR VOMITING  . Prenatal MV-Min-Fe Fum-FA-DHA (PRENATAL 1 PO) Take by mouth.  Marland Kitchen PROCTOZONE-HC 2.5 % rectal cream APPLY RECTALLY TWICE DAILY AS NEEDED AFTER BOWEL MOVEMENT  . ranitidine (ZANTAC) 300 MG tablet Take 1 tablet (300 mg total) by mouth 2 (two) times daily.  . rizatriptan (MAXALT) 10 MG tablet TAKE ONE TABLET AT START OF MIGRAINE - MAY REPEAT ONCE IN 2 HOURS IF NEEDED  . sertraline (ZOLOFT) 50 MG tablet TAKE ONE TABLET EACH DAY  . SUMAtriptan (IMITREX) 6 MG/0.5ML SOLN injection 0.29mL SUBCUTANEOUSLY FOR HEADACHE MAY REPEAT IN 2 HOURS IF HEADACHE PERSISTS -NO MORE THEN 2 SHOTS IN 24 HOURS  . metoCLOPramide (REGLAN) 10 MG tablet TAKE AT ONSET OF MIGRAINE -CAN TAKE EVERY 6 HOURS IF NEEDED  . predniSONE (DELTASONE) 20 MG tablet 2 tablets daily for 3 days, 1 tablet daily for 4 days.  . promethazine-dextromethorphan (PROMETHAZINE-DM) 6.25-15 MG/5ML syrup Take 5 mLs by mouth 4 (four) times daily as needed for cough.  . [DISCONTINUED] Drospirenone-Ethinyl Estradiol-Levomefol (SAFYRAL) 3-0.03-0.451 MG tablet Take 1 tablet by mouth daily.   No current facility-administered medications on file prior to visit.      Allergies: No Known Allergies   Medical History:  Past Medical History:  Diagnosis Date  . Anxiety   . Depression   . FEMALE INFERTILITY 12/13/2009   Qualifier: Diagnosis of  By: Huntley Dec, Scott    .  Headache(784.0)    migraines  . Hypertension   . IBS (irritable bowel syndrome)   . Insulin resistance   . Seasonal allergies   . Urticaria    Family history- Reviewed and unchanged Social history- Reviewed and unchanged   Review of Systems:  Review of Systems  Constitutional: Negative for malaise/fatigue and weight loss.  HENT: Negative for hearing loss and tinnitus.   Eyes: Negative for blurred vision and double vision.  Respiratory: Negative for cough, shortness of breath and wheezing.   Cardiovascular: Negative for chest pain, palpitations, orthopnea, claudication and leg swelling.  Gastrointestinal: Negative for abdominal pain, blood in stool, constipation, diarrhea, heartburn, melena, nausea and vomiting.  Genitourinary: Negative.   Musculoskeletal: Negative for back pain, joint pain and myalgias.  Skin: Negative for rash.  Neurological: Positive for headaches (cycle associated migraines). Negative for dizziness, tingling, sensory change and weakness.  Endo/Heme/Allergies: Positive for environmental allergies. Negative for polydipsia.  Psychiatric/Behavioral: Negative.   All other systems reviewed and are negative.   Physical Exam: BP 110/80   Pulse 93   Temp Marland Kitchen)  97.3 F (36.3 C)   Ht 5\' 4"  (1.626 m)   Wt 215 lb (97.5 kg)   LMP 06/19/2018   SpO2 98%   BMI 36.90 kg/m  Wt Readings from Last 3 Encounters:  07/12/18 215 lb (97.5 kg)  03/09/18 220 lb (99.8 kg)  02/16/18 217 lb (98.4 kg)   General Appearance: Well nourished, in no apparent distress. Eyes: PERRLA, EOMs, conjunctiva no swelling or erythema Sinuses: No Frontal/maxillary tenderness ENT/Mouth: Ext aud canals clear, TMs without erythema, bulging. No erythema, swelling, or exudate on post pharynx.  Tonsils not swollen or erythematous. Hearing normal.  Neck: Supple, thyroid normal.  Respiratory: Respiratory effort normal, BS equal bilaterally without rales, rhonchi, wheezing or stridor.  Cardio: RRR with no  MRGs. Brisk peripheral pulses without edema.  Abdomen: Soft, + BS.  Non tender, no guarding, rebound, hernias, masses. Lymphatics: Non tender without lymphadenopathy.  Musculoskeletal: Full ROM, 5/5 strength, Normal gait Skin: Warm, dry without rashes, lesions, ecchymosis.  Neuro: Cranial nerves intact. No cerebellar symptoms.  Psych: Awake and oriented X 3, normal affect, Insight and Judgment appropriate.    Dan Maker, NP 9:42 AM [facility]

## 2018-07-12 ENCOUNTER — Ambulatory Visit: Payer: BLUE CROSS/BLUE SHIELD | Admitting: Adult Health

## 2018-07-12 ENCOUNTER — Encounter: Payer: Self-pay | Admitting: Adult Health

## 2018-07-12 VITALS — BP 110/80 | HR 93 | Temp 97.3°F | Ht 64.0 in | Wt 215.0 lb

## 2018-07-12 DIAGNOSIS — R945 Abnormal results of liver function studies: Secondary | ICD-10-CM

## 2018-07-12 DIAGNOSIS — Z79899 Other long term (current) drug therapy: Secondary | ICD-10-CM

## 2018-07-12 DIAGNOSIS — I10 Essential (primary) hypertension: Secondary | ICD-10-CM | POA: Diagnosis not present

## 2018-07-12 DIAGNOSIS — E559 Vitamin D deficiency, unspecified: Secondary | ICD-10-CM | POA: Diagnosis not present

## 2018-07-12 DIAGNOSIS — F411 Generalized anxiety disorder: Secondary | ICD-10-CM

## 2018-07-12 DIAGNOSIS — E669 Obesity, unspecified: Secondary | ICD-10-CM

## 2018-07-12 DIAGNOSIS — E782 Mixed hyperlipidemia: Secondary | ICD-10-CM | POA: Diagnosis not present

## 2018-07-12 NOTE — Patient Instructions (Signed)
Goals    . Weight (lb) < 200 lb (90.7 kg)       Know what a healthy weight is for you (roughly BMI <25) and aim to maintain this  Aim for 7+ servings of fruits and vegetables daily  65-80+ fluid ounces of water or unsweet tea for healthy kidneys  Limit to max 1 drink of alcohol per day; avoid smoking/tobacco  Limit animal fats in diet for cholesterol and heart health - choose grass fed whenever available  Avoid highly processed foods, and foods high in saturated/trans fats  Aim for low stress - take time to unwind and care for your mental health  Aim for 150 min of moderate intensity exercise weekly for heart health, and weights twice weekly for bone health  Aim for 7-9 hours of sleep daily       When it comes to diets, agreement about the perfect plan isn't easy to find, even among the experts. Experts at the [facility] of [employer] developed an idea known as the Healthy Eating Plate. Just imagine a plate divided into logical, healthy portions.  The emphasis is on diet quality:  Load up on vegetables and fruits - one-half of your plate: Aim for color and variety, and remember that potatoes don't count.  Go for whole grains - one-quarter of your plate: Whole wheat, barley, wheat berries, quinoa, oats, brown rice, and foods made with them. If you want pasta, go with whole wheat pasta.  Protein power - one-quarter of your plate: Fish, chicken, beans, and nuts are all healthy, versatile protein sources. Limit red meat.  The diet, however, does go beyond the plate, offering a few other suggestions.  Use healthy plant oils, such as olive, canola, soy, corn, sunflower and peanut. Check the labels, and avoid partially hydrogenated oil, which have unhealthy trans fats.  If you're thirsty, drink water. Coffee and tea are good in moderation, but skip sugary drinks and limit milk and dairy products to one or two daily servings.  The type of carbohydrate in the diet is more  important than the amount. Some sources of carbohydrates, such as vegetables, fruits, whole grains, and beans-are healthier than others.  Finally, stay active.

## 2018-07-13 ENCOUNTER — Encounter: Payer: Self-pay | Admitting: Adult Health

## 2018-07-13 ENCOUNTER — Other Ambulatory Visit: Payer: Self-pay | Admitting: Adult Health

## 2018-07-13 DIAGNOSIS — R7989 Other specified abnormal findings of blood chemistry: Secondary | ICD-10-CM

## 2018-07-13 DIAGNOSIS — R945 Abnormal results of liver function studies: Principal | ICD-10-CM

## 2018-07-13 LAB — MAGNESIUM: Magnesium: 1.9 mg/dL (ref 1.5–2.5)

## 2018-07-13 LAB — CBC WITH DIFFERENTIAL/PLATELET
BASOS PCT: 0.8 %
Basophils Absolute: 74 cells/uL (ref 0–200)
EOS ABS: 251 {cells}/uL (ref 15–500)
EOS PCT: 2.7 %
HEMATOCRIT: 39.3 % (ref 35.0–45.0)
HEMOGLOBIN: 13.2 g/dL (ref 11.7–15.5)
Lymphs Abs: 2437 cells/uL (ref 850–3900)
MCH: 29.5 pg (ref 27.0–33.0)
MCHC: 33.6 g/dL (ref 32.0–36.0)
MCV: 87.9 fL (ref 80.0–100.0)
MONOS PCT: 7.4 %
MPV: 10.1 fL (ref 7.5–12.5)
NEUTROS ABS: 5850 {cells}/uL (ref 1500–7800)
Neutrophils Relative %: 62.9 %
PLATELETS: 290 10*3/uL (ref 140–400)
RBC: 4.47 10*6/uL (ref 3.80–5.10)
RDW: 12.7 % (ref 11.0–15.0)
Total Lymphocyte: 26.2 %
WBC mixed population: 688 cells/uL (ref 200–950)
WBC: 9.3 10*3/uL (ref 3.8–10.8)

## 2018-07-13 LAB — COMPLETE METABOLIC PANEL WITH GFR
AG RATIO: 1.6 (calc) (ref 1.0–2.5)
ALKALINE PHOSPHATASE (APISO): 104 U/L (ref 33–115)
ALT: 68 U/L — ABNORMAL HIGH (ref 6–29)
AST: 51 U/L — ABNORMAL HIGH (ref 10–30)
Albumin: 4.6 g/dL (ref 3.6–5.1)
BILIRUBIN TOTAL: 0.7 mg/dL (ref 0.2–1.2)
BUN: 11 mg/dL (ref 7–25)
CHLORIDE: 105 mmol/L (ref 98–110)
CO2: 25 mmol/L (ref 20–32)
Calcium: 10.2 mg/dL (ref 8.6–10.2)
Creat: 0.84 mg/dL (ref 0.50–1.10)
GFR, Est African American: 107 mL/min/{1.73_m2} (ref >=60)
GFR, Est Non African American: 92 mL/min/{1.73_m2} (ref >=60)
GLOBULIN: 2.9 g/dL (ref 1.9–3.7)
Glucose, Bld: 86 mg/dL (ref 65–99)
POTASSIUM: 4.7 mmol/L (ref 3.5–5.3)
SODIUM: 138 mmol/L (ref 135–146)
Total Protein: 7.5 g/dL (ref 6.1–8.1)

## 2018-07-13 LAB — TEST AUTHORIZATION

## 2018-07-13 LAB — LIPID PANEL
CHOLESTEROL: 201 mg/dL — AB (ref <200)
HDL: 48 mg/dL — AB (ref >50)
LDL Cholesterol (Calc): 123 mg/dL (calc) — ABNORMAL HIGH
NON-HDL CHOLESTEROL (CALC): 153 mg/dL — AB (ref <130)
Total CHOL/HDL Ratio: 4.2 (calc) (ref <5.0)
Triglycerides: 179 mg/dL — ABNORMAL HIGH (ref <150)

## 2018-07-13 LAB — TSH: TSH: 1.93 m[IU]/L

## 2018-07-13 LAB — GAMMA GT: GGT: 83 U/L — ABNORMAL HIGH (ref 3–50)

## 2018-07-14 ENCOUNTER — Other Ambulatory Visit: Payer: BLUE CROSS/BLUE SHIELD

## 2018-07-14 ENCOUNTER — Other Ambulatory Visit: Payer: Self-pay | Admitting: Adult Health

## 2018-07-14 DIAGNOSIS — R7989 Other specified abnormal findings of blood chemistry: Secondary | ICD-10-CM

## 2018-07-14 DIAGNOSIS — R945 Abnormal results of liver function studies: Principal | ICD-10-CM

## 2018-07-14 DIAGNOSIS — R5383 Other fatigue: Secondary | ICD-10-CM

## 2018-07-14 DIAGNOSIS — E559 Vitamin D deficiency, unspecified: Secondary | ICD-10-CM

## 2018-07-15 LAB — VITAMIN D 25 HYDROXY (VIT D DEFICIENCY, FRACTURES): Vit D, 25-Hydroxy: 52 ng/mL (ref 30–100)

## 2018-07-15 LAB — FERRITIN: Ferritin: 104 ng/mL (ref 16–154)

## 2018-07-18 LAB — MITOCHONDRIAL ANTIBODIES: Mitochondrial M2 Ab, IgG: <=20 U

## 2018-07-18 LAB — HEPATITIS PANEL, ACUTE
HEP A IGM: NONREACTIVE
Hep B C IgM: NONREACTIVE
Hepatitis B Surface Ag: NONREACTIVE
Hepatitis C Ab: NONREACTIVE
SIGNAL TO CUT-OFF: 0.07 (ref <1.00)

## 2018-07-18 LAB — ANTI-SMOOTH MUSCLE ANTIBODY, IGG: Actin (Smooth Muscle) Antibody (IGG): <20 U (ref <20)

## 2018-07-21 ENCOUNTER — Ambulatory Visit: Payer: BLUE CROSS/BLUE SHIELD | Admitting: Adult Health

## 2018-07-27 ENCOUNTER — Encounter: Payer: Self-pay | Admitting: Adult Health

## 2018-07-27 ENCOUNTER — Ambulatory Visit
Admission: RE | Admit: 2018-07-27 | Discharge: 2018-07-27 | Disposition: A | Discharge status: Home / Self Care | Payer: BLUE CROSS/BLUE SHIELD | Source: Ambulatory Visit | Attending: Adult Health | Admitting: Adult Health

## 2018-07-27 DIAGNOSIS — K760 Fatty (change of) liver, not elsewhere classified: Secondary | ICD-10-CM | POA: Insufficient documentation

## 2018-07-27 DIAGNOSIS — R7989 Other specified abnormal findings of blood chemistry: Secondary | ICD-10-CM

## 2018-07-27 DIAGNOSIS — R945 Abnormal results of liver function studies: Principal | ICD-10-CM

## 2018-07-28 ENCOUNTER — Other Ambulatory Visit: Payer: Self-pay | Admitting: Adult Health

## 2018-07-28 DIAGNOSIS — E669 Obesity, unspecified: Secondary | ICD-10-CM

## 2018-07-28 DIAGNOSIS — K760 Fatty (change of) liver, not elsewhere classified: Secondary | ICD-10-CM

## 2018-07-30 ENCOUNTER — Encounter: Payer: Self-pay | Admitting: Adult Health

## 2018-07-30 ENCOUNTER — Ambulatory Visit: Payer: BLUE CROSS/BLUE SHIELD | Admitting: Adult Health

## 2018-07-30 VITALS — BP 104/70 | HR 86 | Temp 97.9°F | Ht 64.0 in | Wt 214.0 lb

## 2018-07-30 DIAGNOSIS — M545 Low back pain, unspecified: Secondary | ICD-10-CM

## 2018-07-30 MED ORDER — PREDNISONE 20 MG PO TABS: ORAL_TABLET | ORAL | 0 refills | Status: AC

## 2018-07-30 MED ORDER — CYCLOBENZAPRINE HCL 5 MG PO TABS: 5.0000 mg | ORAL_TABLET | Freq: Three times a day (TID) | ORAL | 0 refills | Status: DC | PRN

## 2018-07-30 NOTE — Patient Instructions (Signed)
Back Pain, Adult Many adults have back pain from time to time. Common causes of back pain include:  A strained muscle or ligament.  Wear and tear (degeneration) of the spinal disks.  Arthritis.  A hit to the back.  Back pain can be short-lived (acute) or last a long time (chronic). A physical exam, lab tests, and imaging studies may be done to find the cause of your pain. Follow these instructions at home: Managing pain and stiffness  Take over-the-counter and prescription medicines only as told by your health care provider.  If directed, apply heat to the affected area as often as told by your health care provider. Use the heat source that your health care provider recommends, such as a moist heat pack or a heating pad. ? Place a towel between your skin and the heat source. ? Leave the heat on for 20-30 minutes. ? Remove the heat if your skin turns bright red. This is especially important if you are unable to feel pain, heat, or cold. You have a greater risk of getting burned.  If directed, apply ice to the injured area: ? Put ice in a plastic bag. ? Place a towel between your skin and the bag. ? Leave the ice on for 20 minutes, 2-3 times a day for the first 2-3 days. Activity  Do not stay in bed. Resting more than 1-2 days can delay your recovery.  Take short walks on even surfaces as soon as you are able. Try to increase the length of time you walk each day.  Do not sit, drive, or stand in one place for more than 30 minutes at a time. Sitting or standing for long periods of time can put stress on your back.  Use proper lifting techniques. When you bend and lift, use positions that put less stress on your back: ? Bend your knees. ? Keep the load close to your body. ? Avoid twisting.  Exercise regularly as told by your health care provider. Exercising will help your back heal faster. This also helps prevent back injuries by keeping muscles strong and flexible.  Your health  care provider may recommend that you see a physical therapist. This person can help you come up with a safe exercise program. Do any exercises as told by your physical therapist. Lifestyle  Maintain a healthy weight. Extra weight puts stress on your back and makes it difficult to have good posture.  Avoid activities or situations that make you feel anxious or stressed. Learn ways to manage anxiety and stress. One way to manage stress is through exercise. Stress and anxiety increase muscle tension and can make back pain worse. General instructions  Sleep on a firm mattress in a comfortable position. Try lying on your side with your knees slightly bent. If you lie on your back, put a pillow under your knees.  Follow your treatment plan as told by your health care provider. This may include: ? Cognitive or behavioral therapy. ? Acupuncture or massage therapy. ? Meditation or yoga. Contact a health care provider if:  You have pain that is not relieved with rest or medicine.  You have increasing pain going down into your legs or buttocks.  Your pain does not improve in 2 weeks.  You have pain at night.  You lose weight.  You have a fever or chills. Get help right away if:  You develop new bowel or bladder control problems.  You have unusual weakness or numbness in your arms   or legs.  You develop nausea or vomiting.  You develop abdominal pain.  You feel faint. Summary  Many adults have back pain from time to time. A physical exam, lab tests, and imaging studies may be done to find the cause of your pain.  Use proper lifting techniques. When you bend and lift, use positions that put less stress on your back.  Take over-the-counter and prescription medicines and apply heat or ice as directed by your health care provider. This information is not intended to replace advice given to you by your health care provider. Make sure you discuss any questions you have with your health care  provider. Document Released: 09/22/2005 Document Revised: 10/27/2016 Document Reviewed: 10/27/2016 Elsevier Interactive Patient Education  2018 [employer]  Back Exercises The following exercises strengthen the muscles that help to support the back. They also help to keep the lower back flexible. Doing these exercises can help to prevent back pain or lessen existing pain. If you have back pain or discomfort, try doing these exercises 2-3 times each day or as told by your health care provider. When the pain goes away, do them once each day, but increase the number of times that you repeat the steps for each exercise (do more repetitions). If you do not have back pain or discomfort, do these exercises once each day or as told by your health care provider. Exercises Single Knee to Chest  Repeat these steps 3-5 times for each leg: 1. Lie on your back on a firm bed or the floor with your legs extended. 2. Bring one knee to your chest. Your other leg should stay extended and in contact with the floor. 3. Hold your knee in place by grabbing your knee or thigh. 4. Pull on your knee until you feel a gentle stretch in your lower back. 5. Hold the stretch for 10-30 seconds. 6. Slowly release and straighten your leg.  Pelvic Tilt  Repeat these steps 5-10 times: 1. Lie on your back on a firm bed or the floor with your legs extended. 2. Bend your knees so they are pointing toward the ceiling and your feet are flat on the floor. 3. Tighten your lower abdominal muscles to press your lower back against the floor. This motion will tilt your pelvis so your tailbone points up toward the ceiling instead of pointing to your feet or the floor. 4. With gentle tension and even breathing, hold this position for 5-10 seconds.  Cat-Cow  Repeat these steps until your lower back becomes more flexible: 1. Get into a hands-and-knees position on a firm surface. Keep your hands under your shoulders, and keep your  knees under your hips. You may place padding under your knees for comfort. 2. Let your head hang down, and point your tailbone toward the floor so your lower back becomes rounded like the back of a cat. 3. Hold this position for 5 seconds. 4. Slowly lift your head and point your tailbone up toward the ceiling so your back forms a sagging arch like the back of a cow. 5. Hold this position for 5 seconds.  Press-Ups  Repeat these steps 5-10 times: 1. Lie on your abdomen (face-down) on the floor. 2. Place your palms near your head, about shoulder-width apart. 3. While you keep your back as relaxed as possible and keep your hips on the floor, slowly straighten your arms to raise the top half of your body and lift your shoulders. Do not use your back   muscles to raise your upper torso. You may adjust the placement of your hands to make yourself more comfortable. 4. Hold this position for 5 seconds while you keep your back relaxed. 5. Slowly return to lying flat on the floor.  Bridges  Repeat these steps 10 times: 1. Lie on your back on a firm surface. 2. Bend your knees so they are pointing toward the ceiling and your feet are flat on the floor. 3. Tighten your buttocks muscles and lift your buttocks off of the floor until your waist is at almost the same height as your knees. You should feel the muscles working in your buttocks and the back of your thighs. If you do not feel these muscles, slide your feet 1-2 inches farther away from your buttocks. 4. Hold this position for 3-5 seconds. 5. Slowly lower your hips to the starting position, and allow your buttocks muscles to relax completely.  If this exercise is too easy, try doing it with your arms crossed over your chest. Abdominal Crunches  Repeat these steps 5-10 times: 1. Lie on your back on a firm bed or the floor with your legs extended. 2. Bend your knees so they are pointing toward the ceiling and your feet are flat on the  floor. 3. Cross your arms over your chest. 4. Tip your chin slightly toward your chest without bending your neck. 5. Tighten your abdominal muscles and slowly raise your trunk (torso) high enough to lift your shoulder blades a tiny bit off of the floor. Avoid raising your torso higher than that, because it can put too much stress on your low back and it does not help to strengthen your abdominal muscles. 6. Slowly return to your starting position.  Back Lifts Repeat these steps 5-10 times: 1. Lie on your abdomen (face-down) with your arms at your sides, and rest your forehead on the floor. 2. Tighten the muscles in your legs and your buttocks. 3. Slowly lift your chest off of the floor while you keep your hips pressed to the floor. Keep the back of your head in line with the curve in your back. Your eyes should be looking at the floor. 4. Hold this position for 3-5 seconds. 5. Slowly return to your starting position.  Contact a health care provider if:  Your back pain or discomfort gets much worse when you do an exercise.  Your back pain or discomfort does not lessen within 2 hours after you exercise. If you have any of these problems, stop doing these exercises right away. Do not do them again unless your health care provider says that you can. Get help right away if:  You develop sudden, severe back pain. If this happens, stop doing the exercises right away. Do not do them again unless your health care provider says that you can. This information is not intended to replace advice given to you by your health care provider. Make sure you discuss any questions you have with your health care provider. Document Released: 10/30/2004 Document Revised: 01/30/2016 Document Reviewed: 11/16/2014 Elsevier Interactive Patient Education  2017 Elsevier Inc.  

## 2018-07-30 NOTE — Progress Notes (Signed)
Assessment and Plan:  [name] was seen today for back pain.  Diagnoses and all orders for this visit:  Acute midline low back pain without sciatica neg straight leg raises or red flags For acute pain, rest, intermittent application of cold packs (later, may switch to heat, but do not sleep on heating pad), analgesics and muscle relaxants are recommended. Discussed longer term treatment plan of prn NSAID's and discussed a home back care exercise program with flexion exercise routine. Proper lifting with avoidance of heavy lifting discussed. Consider Physical Therapy and XRay studies if not improving. Call or return to clinic prn if these symptoms worsen or fail to improve as anticipated. -     predniSONE (DELTASONE) 20 MG tablet; 3 tablets daily with food for 3 days, 2 tabs daily for 3 days, 1 tab a day for 5 days. -     cyclobenzaprine (FLEXERIL) 5 MG tablet; Take 1 tablet (5 mg total) by mouth 3 (three) times daily as needed for muscle spasms. Discussed to avoid when driving or with other sedating medications.   Further disposition pending results of labs. Discussed med's effects and SE's.   Over 15 minutes of exam, counseling, chart review, and critical decision making was performed.   Future Appointments  Date Time Provider Department Center  11/22/2018  9:00 AM Quentin Mulling, PA-C [facility] None    ------------------------------------------------------------------------------------------------------------------   HPI BP 104/70   Pulse 86   Temp 97.9 F (36.6 C)   Ht 5\' 4"  (1.626 m)   Wt 214 lb (97.1 kg)   LMP 07/18/2018 (Exact Date)   SpO2 98%   BMI 36.73 kg/m   Eileen Rios is a 32 y.o. female who complains of an injury causing midline low back pain 2 day(s) ago. The pain is positional with bending or lifting, without radiation down the legs. Mechanism of injury: unsure. Symptoms have been constant since that time. Prior history of back problems: recurrent self limited  episodes of low back pain in the past. There is no numbness in the legs. She denies loss of bowel or bladder, denies numbness, tingling, weakness. She reports pain is achy with intermittent sharp, 8/10 at its worst, improved in the AM then progressively gets worse. She has tried aleve and leftover hydrocodone from her arm surgery, reports aleve really hasn't helped much, hydrocodone helped for about 1 hour.     Past Medical History:  Diagnosis Date  . Anxiety   . Depression   . FEMALE INFERTILITY 12/13/2009   Qualifier: Diagnosis of  By: Huntley Dec, Scott    . Headache(784.0)    migraines  . Hypertension   . IBS (irritable bowel syndrome)   . Insulin resistance   . Seasonal allergies   . Urticaria      No Known Allergies  Current Outpatient Medications on File Prior to Visit  Medication Sig  . ALPRAZolam (XANAX) 0.5 MG tablet TAKE ONE-HALF TO ONE TABLET TWICE DAILY AS NEEDED FOR ANXIETY  . amphetamine-dextroamphetamine (ADDERALL) 10 MG tablet Take 1 tablet (10 mg total) by mouth 2 (two) times daily with a meal.  . B-D INS SYR ULTRAFINE .5CC/30G 30G X 1/2" 0.5 ML MISC AS DIRECTED  . cetirizine (ZYRTEC) 10 MG tablet Take 10 mg by mouth daily as needed for allergies.  . Chlorphen-PE-Acetaminophen 4-10-325 MG TABS Take 1 tablet by mouth 3 (three) times daily as needed.  . Cholecalciferol 5000 units TABS Take 1 tablet by mouth daily.  . clindamycin (CLINDAGEL) 1 % gel Apply 1  application topically 2 (two) times daily.  Marland Kitchen labetalol (NORMODYNE) 200 MG tablet TAKE ONE TABLET TWICE DAILY  . metFORMIN (GLUCOPHAGE) 500 MG tablet Take 500 mg by mouth every evening.  . nystatin (MYCOSTATIN/NYSTOP) powder APPLY DAILY AFTER A SHOWER AS NEEDED  . ondansetron (ZOFRAN) 4 MG tablet TAKE ONE TABLET DAILY AS NEEDED FOR NAUSEA OR VOMITING  . Prenatal MV-Min-Fe Fum-FA-DHA (PRENATAL 1 PO) Take by mouth.  Marland Kitchen PROCTOZONE-HC 2.5 % rectal cream APPLY RECTALLY TWICE DAILY AS NEEDED AFTER BOWEL MOVEMENT  .  ranitidine (ZANTAC) 300 MG tablet Take 1 tablet (300 mg total) by mouth 2 (two) times daily.  . rizatriptan (MAXALT) 10 MG tablet TAKE ONE TABLET AT START OF MIGRAINE - MAY REPEAT ONCE IN 2 HOURS IF NEEDED  . sertraline (ZOLOFT) 50 MG tablet TAKE ONE TABLET EACH DAY  . SUMAtriptan (IMITREX) 6 MG/0.5ML SOLN injection 0.58mL SUBCUTANEOUSLY FOR HEADACHE MAY REPEAT IN 2 HOURS IF HEADACHE PERSISTS -NO MORE THEN 2 SHOTS IN 24 HOURS  . [DISCONTINUED] Drospirenone-Ethinyl Estradiol-Levomefol (SAFYRAL) 3-0.03-0.451 MG tablet Take 1 tablet by mouth daily.   No current facility-administered medications on file prior to visit.     ROS: all negative except above.   Physical Exam:  BP 104/70   Pulse 86   Temp 97.9 F (36.6 C)   Ht 5\' 4"  (1.626 m)   Wt 214 lb (97.1 kg)   LMP 07/18/2018 (Exact Date)   SpO2 98%   BMI 36.73 kg/m   General Appearance: Well nourished, in no apparent distress. Eyes: conjunctiva no swelling or erythema ENT/Mouth:  Hearing normal.  Neck: Supple  Respiratory: Respiratory effort normal  Cardio: RRR with no MRGs. Brisk peripheral pulses without edema.  Abdomen: Soft, + BS.  Non tender, no guarding. Lymphatics: Non tender without lymphadenopathy.  Musculoskeletal: Patient is able to ambulate well. Gait is  Antalgic. Straight leg raising with dorsiflexion negative bilaterally for radicular symptoms. Sensory exam in the legs are normal. Knee reflexes are normal Ankle reflexes are normal Strength is normal and symmetric in arms and legs. There is not SI tenderness to palpation.  There is paraspinal muscle spasm.  There is midline tenderness.  ROM of spine with  limited in extension due to pain.  Skin: Warm, dry without rashes, lesions, ecchymosis.  Neuro: Normal muscle tone, no cerebellar symptoms. Sensation intact.  Psych: Awake and oriented X 3, normal affect, Insight and Judgment appropriate.     Dan Maker, NP 9:45 AM [facility]

## 2018-08-16 ENCOUNTER — Other Ambulatory Visit: Payer: Self-pay | Admitting: Adult Health

## 2018-08-16 DIAGNOSIS — R5383 Other fatigue: Secondary | ICD-10-CM

## 2018-08-16 MED ORDER — AMPHETAMINE-DEXTROAMPHETAMINE 10 MG PO TABS: 10.0000 mg | ORAL_TABLET | Freq: Two times a day (BID) | ORAL | 0 refills | Status: DC

## 2018-08-30 ENCOUNTER — Other Ambulatory Visit: Payer: Self-pay | Admitting: Adult Health

## 2018-08-30 DIAGNOSIS — F411 Generalized anxiety disorder: Secondary | ICD-10-CM

## 2018-09-07 ENCOUNTER — Other Ambulatory Visit: Payer: Self-pay | Admitting: Internal Medicine

## 2018-09-07 ENCOUNTER — Other Ambulatory Visit: Payer: Self-pay | Admitting: Adult Health

## 2018-09-20 ENCOUNTER — Other Ambulatory Visit: Payer: Self-pay | Admitting: Adult Health

## 2018-09-20 DIAGNOSIS — R5383 Other fatigue: Secondary | ICD-10-CM

## 2018-09-20 MED ORDER — AMPHETAMINE-DEXTROAMPHETAMINE 10 MG PO TABS: 10.0000 mg | ORAL_TABLET | Freq: Two times a day (BID) | ORAL | 0 refills | Status: DC

## 2018-09-21 ENCOUNTER — Ambulatory Visit: Payer: BLUE CROSS/BLUE SHIELD | Admitting: Adult Health Nurse Practitioner

## 2018-09-21 ENCOUNTER — Encounter: Payer: Self-pay | Admitting: Adult Health Nurse Practitioner

## 2018-09-21 VITALS — BP 110/76 | HR 96 | Temp 97.5°F | Ht 64.0 in | Wt 210.0 lb

## 2018-09-21 DIAGNOSIS — B372 Candidiasis of skin and nail: Secondary | ICD-10-CM

## 2018-09-21 DIAGNOSIS — E669 Obesity, unspecified: Secondary | ICD-10-CM

## 2018-09-21 MED ORDER — NYSTATIN 100000 UNIT/GM EX POWD: Freq: Three times a day (TID) | CUTANEOUS | 1 refills | Status: DC | PRN

## 2018-09-21 NOTE — Patient Instructions (Signed)
Keep area clean and dry. Apply nystatin powder You may put a piece of non-adherent pad between the skin if irritation continues.  Change daily or if soiled.    Skin Yeast Infection Skin yeast infection is a condition in which there is an overgrowth of yeast (candida) that normally lives on the skin. This condition usually occurs in areas of the skin that are constantly warm and moist, such as the armpits or the groin. What are the causes? This condition is caused by a change in the normal balance of the yeast and bacteria that live on the skin. What increases the risk? This condition is more likely to develop in:  People who are obese.  Pregnant women.  Women who take birth control pills.  People who have diabetes.  People who take antibiotic medicines.  People who take steroid medicines.  People who are malnourished.  People who have a weak defense (immune) system.  People who are 32 years of age or older.  What are the signs or symptoms? Symptoms of this condition include:  A red, swollen area of the skin.  Bumps on the skin.  Itchiness.  How is this diagnosed? This condition is diagnosed with a medical history and physical exam. Your health care provider may check for yeast by taking light scrapings of the skin to be viewed under a microscope. How is this treated? This condition is treated with medicine. Medicines may be prescribed or be available over-the-counter. The medicines may be:  Taken by mouth (orally).  Applied as a cream.  Follow these instructions at home:  Take or apply over-the-counter and prescription medicines only as told by your health care provider.  Eat more yogurt. This may help to keep your yeast infection from returning.  Maintain a healthy weight. If you need help losing weight, talk with your health care provider.  Keep your skin clean and dry.  If you have diabetes, keep your blood sugar under control. Contact a health care  provider if:  Your symptoms go away and then return.  Your symptoms do not get better with treatment.  Your symptoms get worse.  Your rash spreads.  You have a fever or chills.  You have new symptoms.  You have new warmth or redness of your skin. This information is not intended to replace advice given to you by your health care provider. Make sure you discuss any questions you have with your health care provider. Document Released: 06/10/2011 Document Revised: 05/18/2016 Document Reviewed: 03/26/2015 Elsevier Interactive Patient Education  [employer]2018 [employer]

## 2018-09-21 NOTE — Progress Notes (Signed)
Assessment and Plan:    Candidal intertrigo, - nystatin (NYSTATIN) powder; Apply topically 3 (three) times daily as needed.  Dispense: 60 g; Refill: 1 Keep area clean and dry  May use non adherent pad between skin of bellybutton to prevent friction.  Change BID or as needed if soiled.  Obesity (BMI 30-39.9) -Discussed exercise and dietary modifications  Need for Influenza vaccination: Declined today Discussed benefits with patient Aware this is available to her  Further disposition pending results of labs. Discussed med's effects and SE's.   Over 30 minutes of exam, counseling, chart review, and critical decision making was performed.   Future Appointments  Date Time Provider Department Center  11/22/2018  9:00 AM Quentin Mulling, PA-C [id number] None    ------------------------------------------------------------------------------------------------------------------   HPI 32 y.o.female presents for pain in belly button.  Reports that it started about 5 days ago.  She reports she think she scratched the area.  Reports there was a scant amount of blood when she cleaned it yesterday.  She has been using alcohol to cleanse the area.  She denies any fevers, abdominal pain, nausea or vomiting.  Past Medical History:  Diagnosis Date  . Anxiety   . Depression   . FEMALE INFERTILITY 12/13/2009   Qualifier: Diagnosis of  By: Huntley Dec, Scott    . Headache(784.0)    migraines  . Hypertension   . IBS (irritable bowel syndrome)   . Insulin resistance   . Seasonal allergies   . Urticaria      No Known Allergies  Current Outpatient Medications on File Prior to Visit  Medication Sig  . ALPRAZolam (XANAX) 0.5 MG tablet TAKE ONE-HALF TO ONE TABLET TWICE DAILY AS NEEDED FOR ANXIETY  . amphetamine-dextroamphetamine (ADDERALL) 10 MG tablet Take 1 tablet (10 mg total) by mouth 2 (two) times daily with a meal.  . B-D INS SYR ULTRAFINE .5CC/30G 30G X 1/2" 0.5 ML MISC AS DIRECTED  .  cetirizine (ZYRTEC) 10 MG tablet Take 10 mg by mouth daily as needed for allergies.  . Chlorphen-PE-Acetaminophen 4-10-325 MG TABS Take 1 tablet by mouth 3 (three) times daily as needed.  . Cholecalciferol 5000 units TABS Take 1 tablet by mouth as needed.   . clindamycin (CLINDAGEL) 1 % gel Apply 1 application topically 2 (two) times daily.  . cyclobenzaprine (FLEXERIL) 5 MG tablet Take 1 tablet (5 mg total) by mouth 3 (three) times daily as needed for muscle spasms.  Marland Kitchen labetalol (NORMODYNE) 200 MG tablet TAKE ONE TABLET TWICE DAILY  . metFORMIN (GLUCOPHAGE) 500 MG tablet Take 500 mg by mouth every evening.  . ondansetron (ZOFRAN) 4 MG tablet TAKE ONE TABLET DAILY AS NEEDED FOR NAUSEA OR VOMITING  . Prenatal MV-Min-Fe Fum-FA-DHA (PRENATAL 1 PO) Take by mouth.  Marland Kitchen PROCTOZONE-HC 2.5 % rectal cream APPLY RECTALLY TWICE DAILY AS NEEDED AFTER BOWEL MOVEMENT  . ranitidine (ZANTAC) 300 MG tablet Take 1 tablet (300 mg total) by mouth 2 (two) times daily.  . rizatriptan (MAXALT) 10 MG tablet TAKE ONE TABLET AT START OF MIGRAINE - MAY REPEAT ONCE IN 2 HOURS IF NEEDED  . sertraline (ZOLOFT) 50 MG tablet TAKE ONE TABLET EACH DAY  . SUMAtriptan (IMITREX) 6 MG/0.5ML SOLN injection 0.50mL SUBCUTANEOUSLY FOR HEADACHE MAY REPEAT IN 2 HOURS IF HEADACHE PERSISTS -NO MORE THEN 2 SHOTS IN 24 HOURS  . [DISCONTINUED] Drospirenone-Ethinyl Estradiol-Levomefol (SAFYRAL) 3-0.03-0.451 MG tablet Take 1 tablet by mouth daily.   No current facility-administered medications on file prior to visit.  ROS: Review of Systems  Constitutional: Negative for chills, diaphoresis, fever, malaise/fatigue and weight loss.  Cardiovascular: Negative for chest pain, palpitations, orthopnea, claudication, leg swelling and PND.  Gastrointestinal: Negative for abdominal pain, blood in stool, constipation, diarrhea, heartburn, melena, nausea and vomiting.  Genitourinary: Negative for dysuria, flank pain, frequency, hematuria and urgency.   Musculoskeletal: Negative for back pain, falls, joint pain, myalgias and neck pain.  Skin: Positive for itching and rash.       Between belly button.  Endo/Heme/Allergies: Negative for environmental allergies and polydipsia. Does not bruise/bleed easily.     Physical Exam:  BP 110/76   Pulse 96   Temp (!) 97.5 F (36.4 C)   Ht 5\' 4"  (1.626 m)   Wt 210 lb (95.3 kg)   SpO2 97%   BMI 36.05 kg/m   General Appearance: Well nourished, in no apparent distress. Eyes: PERRLA, EOMs, conjunctiva no swelling or erythema Sinuses: No Frontal/maxillary tenderness ENT/Mouth: Ext aud canals clear, TMs without erythema, bulging. No erythema, swelling, or exudate on post pharynx.  Tonsils not swollen or erythematous. Hearing normal.  Neck: Supple, thyroid normal.  Respiratory: Respiratory effort normal, BS equal bilaterally without rales, rhonchi, wheezing or stridor.  Cardio: RRR with no MRGs. Brisk peripheral pulses without edema.  Abdomen: Soft, + BS.  Non tender, no guarding, rebound, hernias, masses. Lymphatics: Non tender without lymphadenopathy.  Musculoskeletal: Full ROM, 5/5 strength, normal gait.  Skin: Warm, lesions, ecchymosis.  Redness noted with defined boarders and white exudate.  Neuro: Cranial nerves intact. Normal muscle tone, no cerebellar symptoms. Sensation intact.  Psych: Awake and oriented X 3, normal affect, Insight and Judgment appropriate.     Elder NegusKyra Kaziyah Parkison, NP 12:03 PM [facility]

## 2018-09-24 ENCOUNTER — Other Ambulatory Visit: Payer: Self-pay | Admitting: Adult Health Nurse Practitioner

## 2018-09-24 DIAGNOSIS — B372 Candidiasis of skin and nail: Secondary | ICD-10-CM

## 2018-09-24 MED ORDER — NYSTATIN-TRIAMCINOLONE 100000-0.1 UNIT/GM-% EX OINT: 1.0000 "application " | TOPICAL_OINTMENT | Freq: Two times a day (BID) | CUTANEOUS | 1 refills | Status: DC

## 2018-10-04 ENCOUNTER — Other Ambulatory Visit: Payer: Self-pay | Admitting: Physician Assistant

## 2018-10-04 ENCOUNTER — Other Ambulatory Visit: Payer: Self-pay | Admitting: Internal Medicine

## 2018-10-04 DIAGNOSIS — F411 Generalized anxiety disorder: Secondary | ICD-10-CM

## 2018-10-14 MED ORDER — PREDNISONE 20 MG PO TABS: ORAL_TABLET | ORAL | 0 refills | Status: DC

## 2018-10-28 MED ORDER — FAMOTIDINE 40 MG PO TABS: 40.0000 mg | ORAL_TABLET | Freq: Every evening | ORAL | 1 refills | Status: DC

## 2018-11-05 ENCOUNTER — Other Ambulatory Visit: Payer: Self-pay | Admitting: Adult Health

## 2018-11-05 DIAGNOSIS — R5383 Other fatigue: Secondary | ICD-10-CM

## 2018-11-18 ENCOUNTER — Encounter: Payer: Self-pay | Admitting: Physician Assistant

## 2018-11-22 ENCOUNTER — Encounter: Payer: Self-pay | Admitting: Physician Assistant

## 2018-12-06 ENCOUNTER — Other Ambulatory Visit: Payer: Self-pay | Admitting: Adult Health

## 2018-12-10 ENCOUNTER — Other Ambulatory Visit: Payer: Self-pay | Admitting: Physician Assistant

## 2018-12-10 ENCOUNTER — Other Ambulatory Visit: Payer: Self-pay | Admitting: Adult Health

## 2018-12-10 DIAGNOSIS — R5383 Other fatigue: Secondary | ICD-10-CM

## 2019-02-23 ENCOUNTER — Other Ambulatory Visit: Payer: Self-pay | Admitting: Internal Medicine

## 2019-11-23 ENCOUNTER — Encounter: Payer: Self-pay | Admitting: Physician Assistant

## 2019-12-04 IMAGING — US US ABDOMEN LIMITED
1 series · 14 of 25 positions shown · non-contrast
Comparison: None.

CLINICAL DATA: Elevated liver function tests

EXAM:
ULTRASOUND ABDOMEN LIMITED RIGHT UPPER QUADRANT

[Series 1: us abdomen limited · 0.22mm/px · 14 of 42 slices shown]
[im 1/42]
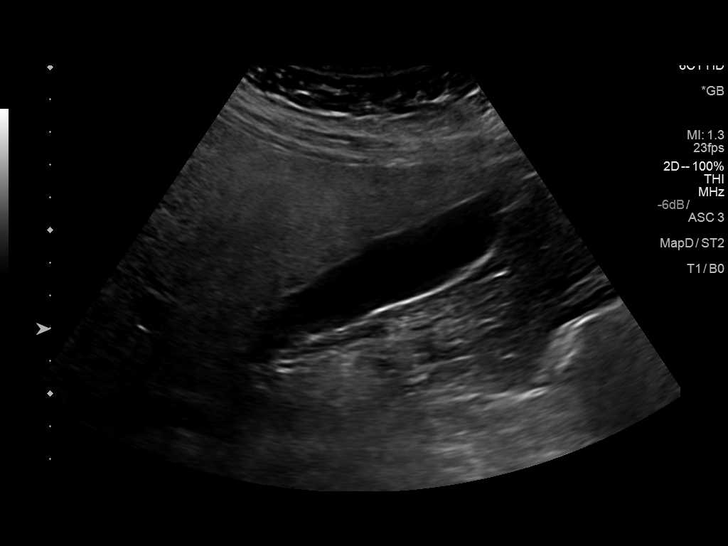
[im 4/42]
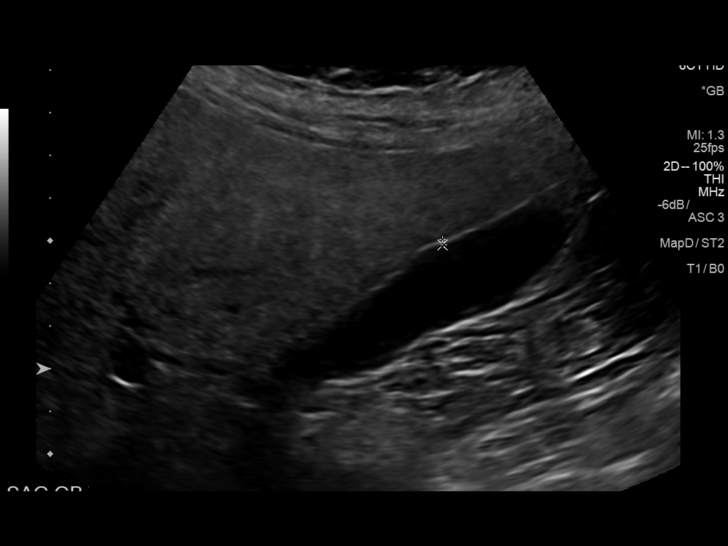
[im 7/42]
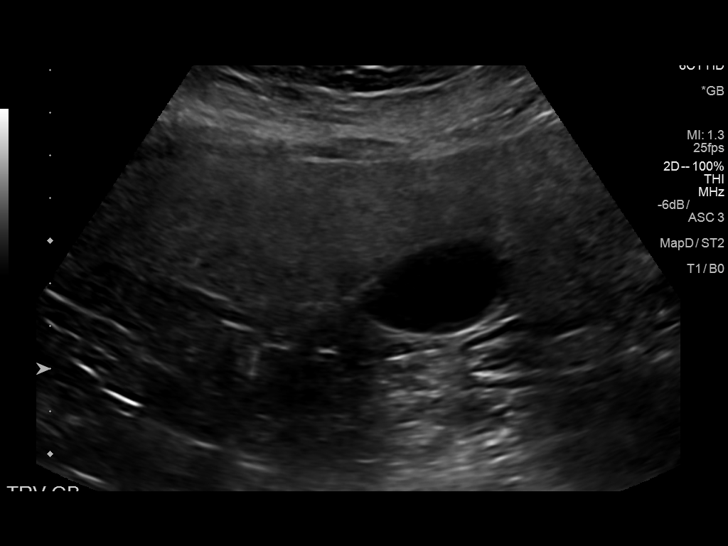
[im 11/42]
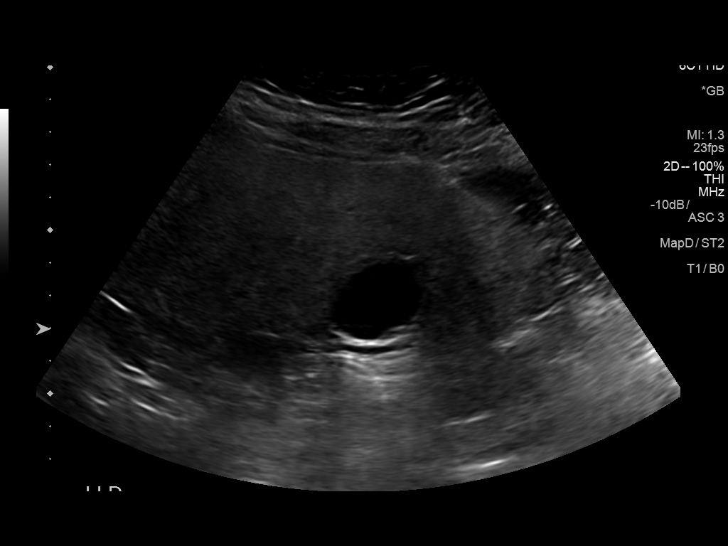
[im 14/42]
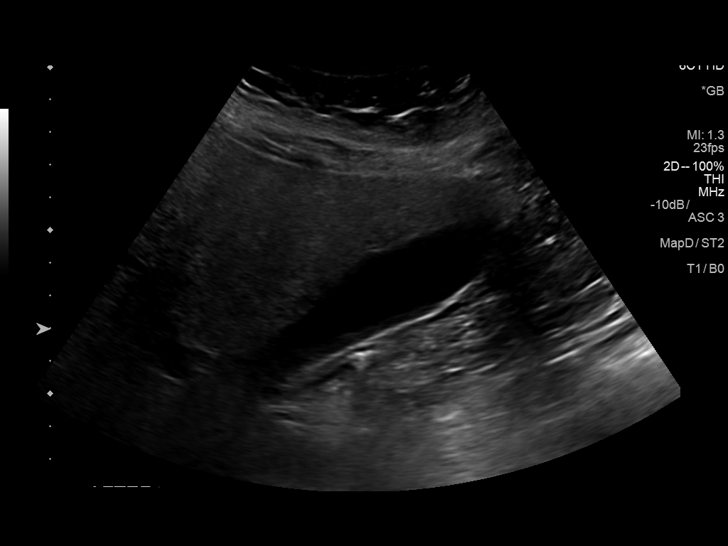
[im 16/42]
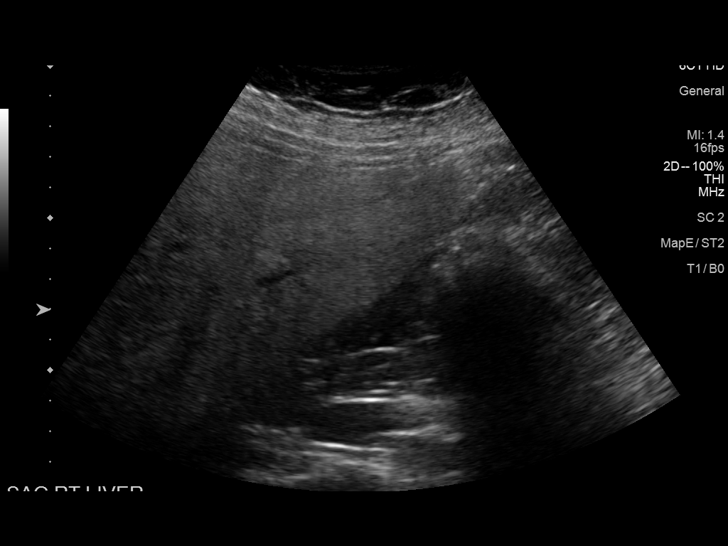
[im 19/42]
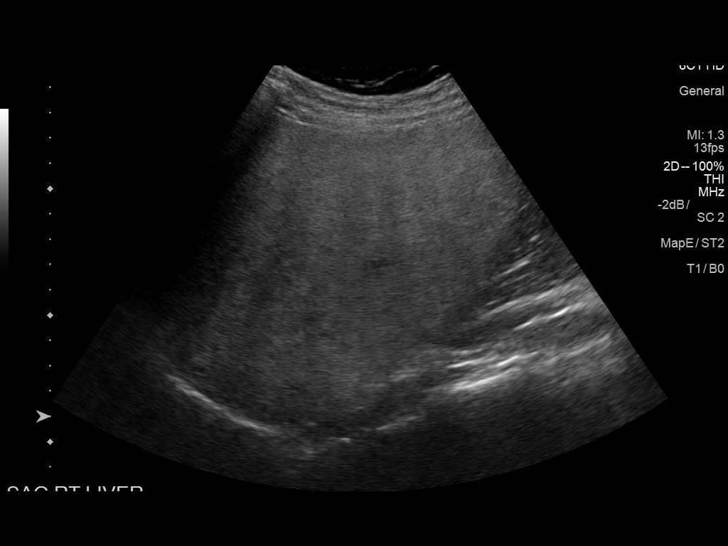
[im 23/42]
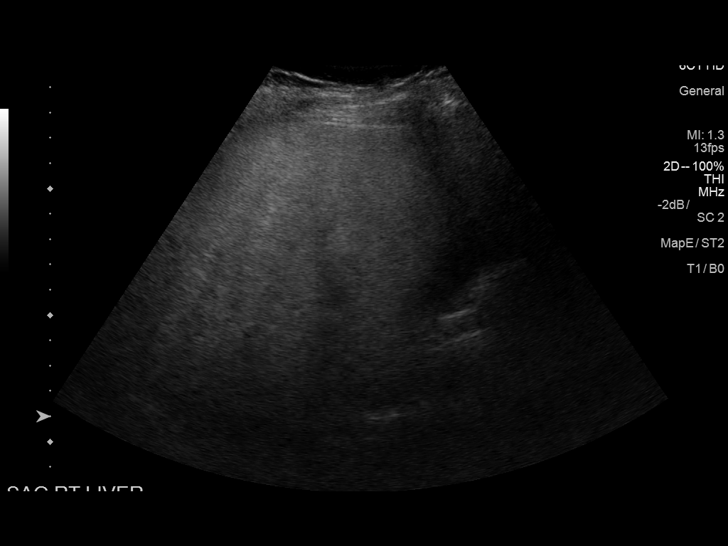
[im 26/42]
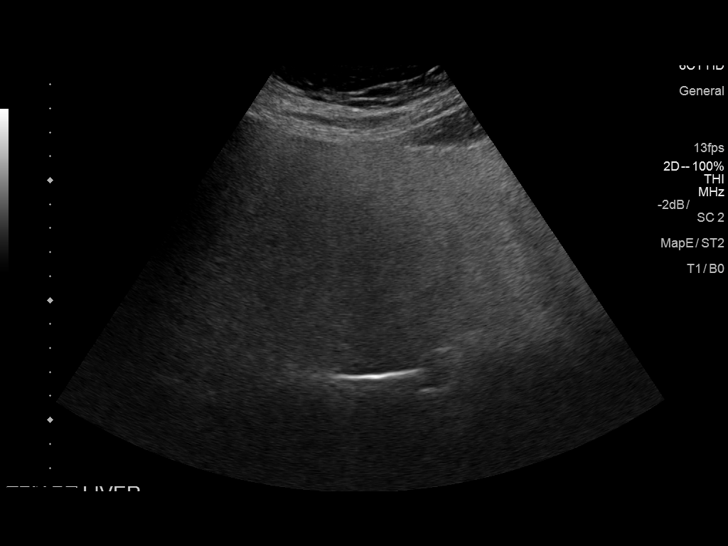
[im 28/42]
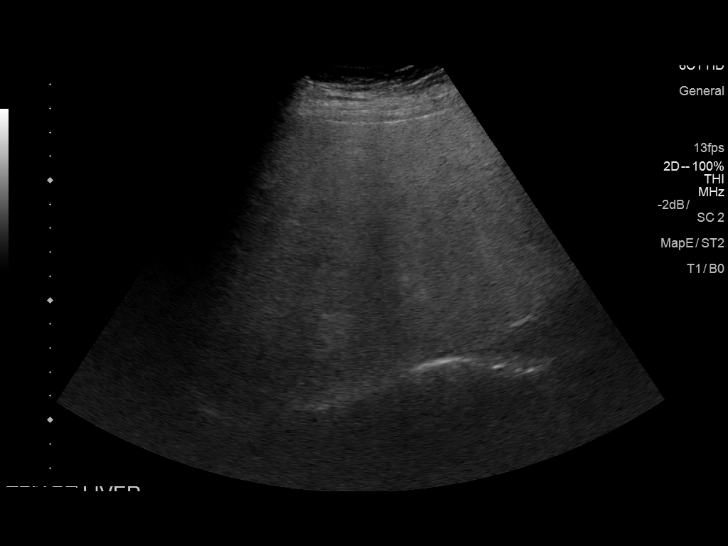
[im 31/42]
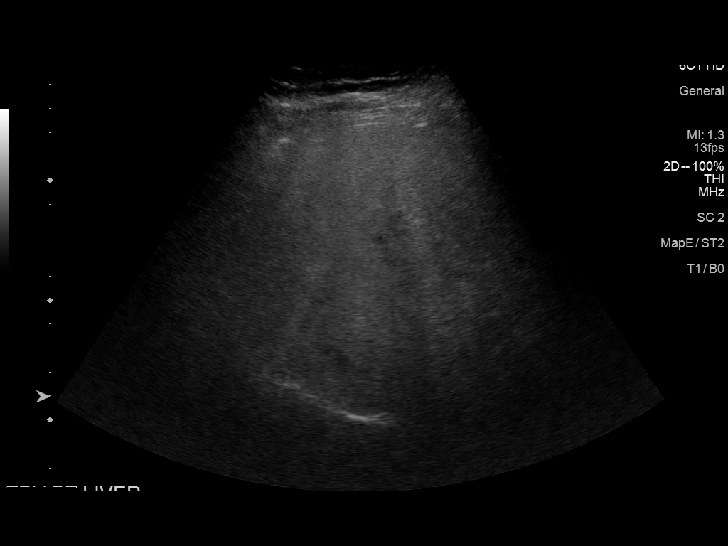
[im 35/42]
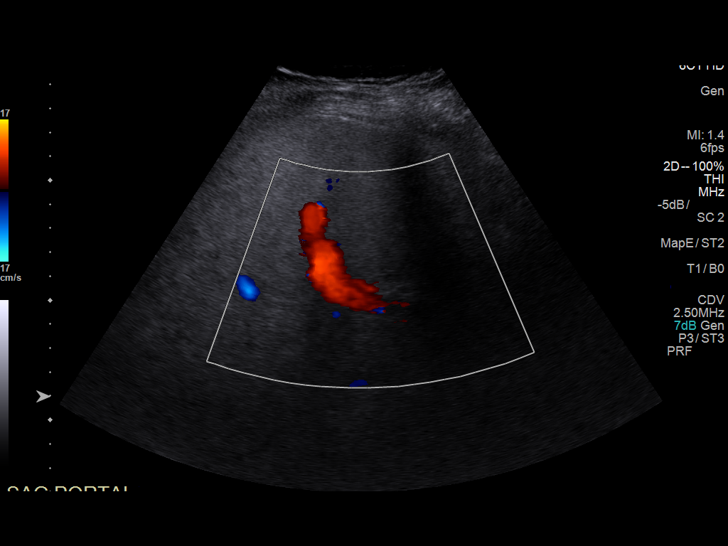
[im 38/42]
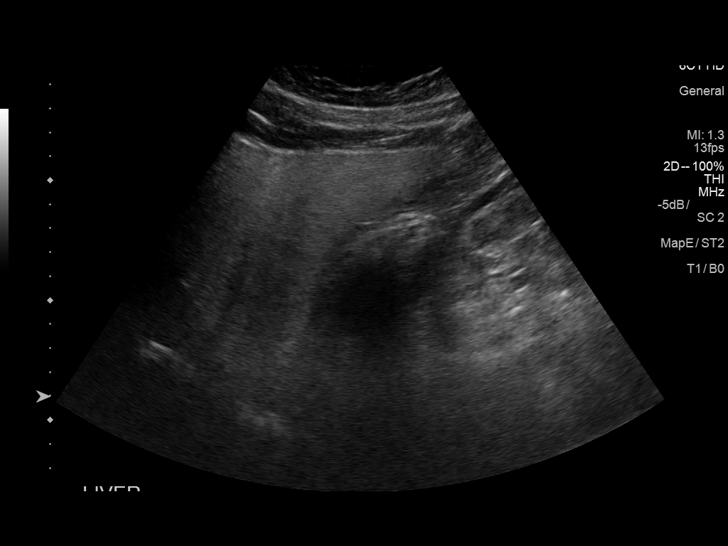
[im 42/42]
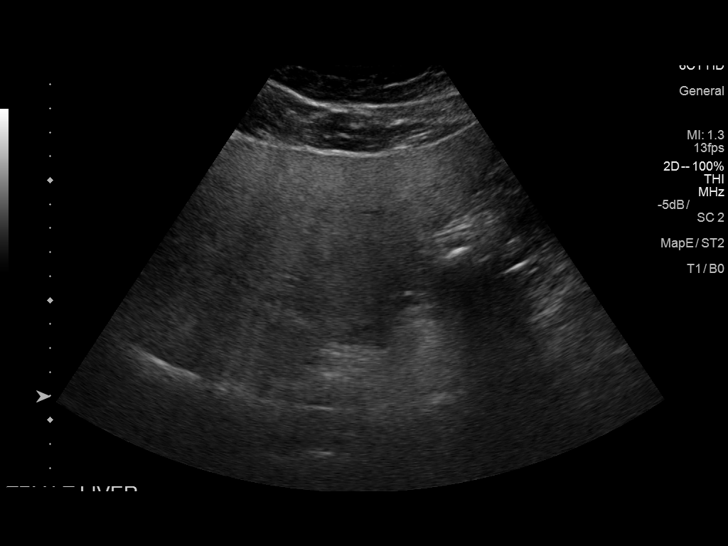

[14 of 25 positions shown; findings below may reference images not displayed]

FINDINGS: Gallbladder:

The gallbladder is visualized and no gallstones are noted. There is
no pain over the gallbladder with compression.

Common bile duct:

Diameter: The common bile duct is normal measuring 3.0 mm in
diameter.

Liver:

The parenchyma of the liver is inhomogeneous and echogenic
consistent with hepatic steatosis. No focal hepatic abnormality is
noted. Portal vein is patent on color Doppler imaging with normal
direction of blood flow towards the liver.
IMPRESSION: 1. Echogenic inhomogeneous hepatic parenchyma consistent with
hepatic steatosis. No focal liver lesion.
2. No gallstones.

## 2020-08-28 ENCOUNTER — Ambulatory Visit
Admission: EM | Admit: 2020-08-28 | Discharge: 2020-08-28 | Disposition: A | Discharge status: Home / Self Care | Payer: BLUE CROSS/BLUE SHIELD | Attending: Emergency Medicine | Admitting: Emergency Medicine

## 2020-08-28 ENCOUNTER — Other Ambulatory Visit: Payer: Self-pay

## 2020-08-28 DIAGNOSIS — J0100 Acute maxillary sinusitis, unspecified: Secondary | ICD-10-CM | POA: Diagnosis not present

## 2020-08-28 MED ORDER — AMOXICILLIN-POT CLAVULANATE 875-125 MG PO TABS: 1.0000 | ORAL_TABLET | Freq: Two times a day (BID) | ORAL | 0 refills | Status: AC

## 2020-08-28 NOTE — ED Triage Notes (Signed)
Pt states that she has had a cough and congestion since Thursday and has not improved. Pt is aox4 and ambulatory.

## 2020-08-28 NOTE — ED Provider Notes (Addendum)
____________________________________________  Time seen: Approximately 2:50 PM  I have reviewed the triage vital signs and the nursing notes.   HISTORY  Chief Complaint Cough (since thursday) and Nasal Congestion (since thursday)   Historian Patient   HPI Eileen Rios is a 34 y.o. female presents  with sinus pressure and pain for the past 5 days.  She is also had sporadic cough and mild pharyngitis.  She states that she feels like cinderblock is sitting on her face.  She has prone to sinus infections and she states that her current symptoms feel similar.  She has had some purulent rhinorrhea.  No chest pain, chest tightness or abdominal pain.   Past Medical History:  Diagnosis Date  . Anxiety   . Depression   . FEMALE INFERTILITY 12/13/2009   Qualifier: Diagnosis of  By: Huntley Dec, Scott    . Headache(784.0)    migraines  . Hypertension   . IBS (irritable bowel syndrome)   . Insulin resistance   . Seasonal allergies   . Urticaria      Immunizations up to date:  Yes.     Past Medical History:  Diagnosis Date  . Anxiety   . Depression   . FEMALE INFERTILITY 12/13/2009   Qualifier: Diagnosis of  By: Huntley Dec, Scott    . Headache(784.0)    migraines  . Hypertension   . IBS (irritable bowel syndrome)   . Insulin resistance   . Seasonal allergies   . Urticaria     Patient Active Problem List   Diagnosis Date Noted  . Hepatic steatosis 07/27/2018  . Elevated LFTs 07/13/2018  . Vitamin D deficiency 07/08/2018  . GERD (gastroesophageal reflux disease) 07/08/2018  . Hyperlipidemia 03/08/2018  . Insomnia 03/07/2015  . Generalized anxiety disorder 11/07/2014  . Obesity (BMI 30-39.9) 12/07/2013  . Hypertension 05/29/2013  . Allergic rhinitis 08/20/2010  . Fatigue 07/03/2009  . Sinusitis, chronic 10/24/2008  . Migraines 10/24/2008  . IBS 06/06/2008  . ACNE VULGARIS 06/06/2008    Past Surgical History:  Procedure Laterality Date  . CESAREAN  SECTION N/A 06/05/2013   Procedure: Primary cesarean section with delivery of baby boy at 1033.  Apgars 1/8.;  Surgeon: Genia Del, MD;  Location: [facility];  Service: Obstetrics;  Laterality: N/A;  . WISDOM TOOTH EXTRACTION      Prior to Admission medications   Medication Sig Start Date End Date Taking? Authorizing Provider  ALPRAZolam (XANAX) 0.5 MG tablet TAKE ONE-HALF TO ONE TABLET TWICE DAILY AS NEEDED FOR ANXIETY. AVOID TAKING DAILY, SCRIPT SHOULD LAST MUCH LONGER THAN A MONTH. 10/05/18   Judd Gaudier, NP  amoxicillin-clavulanate (AUGMENTIN) 875-125 MG tablet Take 1 tablet by mouth 2 (two) times daily for 10 days. 08/28/20 09/07/20  Orvil Feil, PA-C  amphetamine-dextroamphetamine (ADDERALL) 10 MG tablet Take 1 tab twice daily as needed for focus. Avoid taking daily. Need future refills through new primary care provider. 12/10/18   Judd Gaudier, NP  B-D INS SYR ULTRAFINE .5CC/30G 30G X 1/2" 0.5 ML MISC AS DIRECTED 07/04/17   Doree Albee, PA-C  cetirizine (ZYRTEC) 10 MG tablet Take 10 mg by mouth daily as needed for allergies.    [provider]  Chlorphen-PE-Acetaminophen 4-10-325 MG TABS Take 1 tablet by mouth 3 (three) times daily as needed. 11/09/17   Doree Albee, PA-C  Cholecalciferol 5000 units TABS Take 1 tablet by mouth as needed.     [provider]  clindamycin (CLINDAGEL) 1 % gel Apply  1 application topically 2 (two) times daily. 02/11/17   Doree Albee, PA-C  cyclobenzaprine (FLEXERIL) 5 MG tablet Take 1 tablet (5 mg total) by mouth 3 (three) times daily as needed for muscle spasms. 07/30/18   Judd Gaudier, NP  famotidine (PEPCID) 40 MG tablet Take 1 tablet (40 mg total) by mouth every evening. 10/28/18 10/28/19  Doree Albee, PA-C  labetalol (NORMODYNE) 200 MG tablet TAKE ONE TABLET TWICE DAILY 12/06/18   Judd Gaudier, NP  metFORMIN (GLUCOPHAGE) 500 MG tablet Take 500 mg by mouth every evening.    [provider]  nystatin  (NYSTATIN) powder Apply topically 3 (three) times daily as needed. 09/21/18   Elder Negus, NP  nystatin-triamcinolone ointment (MYCOLOG) Apply 1 application topically 2 (two) times daily. 09/24/18   Elder Negus, NP  ondansetron (ZOFRAN) 4 MG tablet TAKE ONE TABLET DAILY AS NEEDED FOR NAUSEA OR VOMITING 12/09/17   Doree Albee, PA-C  predniSONE (DELTASONE) 20 MG tablet 2 tablets daily for 3 days, 1 tablet daily for 4 days. 10/14/18   Doree Albee, PA-C  Prenatal MV-Min-Fe Fum-FA-DHA (PRENATAL 1 PO) Take by mouth.    [provider]  PROCTOZONE-HC 2.5 % rectal cream APPLY RECTALLY TWICE DAILY AS NEEDED AFTER BOWEL MOVEMENT 02/26/17   Quentin Mulling R, PA-C  ranitidine (ZANTAC) 300 MG tablet TAKE ONE TABLET TWICE DAILY 12/10/18   Judd Gaudier, NP  rizatriptan (MAXALT) 10 MG tablet TAKE ONE TABLET AT START OF MIGRAINE- MAY REPEAT ONCE IN 2 HOURS IF NEEDED 02/24/19   Judd Gaudier, NP  sertraline (ZOLOFT) 50 MG tablet TAKE ONE TABLET EACH DAY 10/05/18   McClanahan, Bella Kennedy, NP  SUMAtriptan (IMITREX) 6 MG/0.5ML SOLN injection 0.18mL SUBCUTANEOUSLY FOR HEADACHE MAY REPEAT IN 2 HOURS IF HEADACHE PERSISTS -NO MORE THEN 2 SHOTS IN 24 HOURS 07/04/17   Doree Albee, PA-C  Drospirenone-Ethinyl Estradiol-Levomefol (SAFYRAL) 3-0.03-0.451 MG tablet Take 1 tablet by mouth daily. 03/21/14 03/21/14  Doree Albee, PA-C    Allergies Patient has no known allergies.  Family History  Problem Relation Age of Onset  . Diabetes Mother   . Hypertension Father   . Migraines Neg Hx     Social History Social History   Tobacco Use  . Smoking status: Never Smoker  . Smokeless tobacco: Never Used  Vaping Use  . Vaping Use: Never used  Substance Use Topics  . Alcohol use: No  . Drug use: No     Review of Systems  Constitutional: No fever/chills Eyes:  No discharge ENT: Patient has facial pain.  Respiratory: no cough. No SOB/ use of accessory muscles to breath Gastrointestinal:    No nausea, no vomiting.  No diarrhea.  No constipation. Musculoskeletal: Negative for musculoskeletal pain. Skin: Negative for rash, abrasions, lacerations, ecchymosis.    ____________________________________________   PHYSICAL EXAM:  VITAL SIGNS: ED Triage Vitals [08/28/20 1431]  Enc Vitals Group     BP (!) 143/88     Pulse Rate (!) 102     Resp 16     Temp 98.4 F (36.9 C)     Temp Source Oral     SpO2 97 %     Weight      Height      Head Circumference      Peak Flow      Pain Score      Pain Loc      Pain Edu?      Excl. in GC?  Constitutional: Alert and oriented. Well appearing and in no acute distress. Eyes: Conjunctivae are normal. PERRL. EOMI. Head: Atraumatic.  Patient has maxillary and frontal sinus tenderness to palpation. ENT:      Ears: TMs are effused bilaterally.       Nose: No congestion/rhinnorhea.      Mouth/Throat: Mucous membranes are moist.  Neck: No stridor.  No cervical spine tenderness to palpation. Cardiovascular: Normal rate, regular rhythm. Normal S1 and S2.  Good peripheral circulation. Respiratory: Normal respiratory effort without tachypnea or retractions. Lungs CTAB. Good air entry to the bases with no decreased or absent breath sounds  Skin:  Skin is warm, dry and intact. No rash noted. Psychiatric: Mood and affect are normal for age. Speech and behavior are normal.   ____________________________________________   LABS (all labs ordered are listed, but only abnormal results are displayed)  Labs Reviewed - No data to display ____________________________________________  EKG   ____________________________________________  RADIOLOGY   No results found.  ____________________________________________    PROCEDURES  Procedure(s) performed:     Procedures     Medications - No data to display   ____________________________________________   INITIAL IMPRESSION / ASSESSMENT AND PLAN / ED COURSE  Pertinent  labs & imaging results that were available during my care of the patient were reviewed by me and considered in my medical decision making (see chart for details).      Assessment and plan Sinusitis 34 year old female presents to the urgent care with facial pain pressure for the past 5 to 6 days as well as purulent rhinorrhea.  Patient had maxillary and frontal sinus tenderness on exam.  Patient endorses a history of sinusitis in the fall and winter in the past.  Patient was discharged with Augmentin to be taken twice daily for the next 10 days.  Also recommended taking Zyrtec daily for the next 7 to 10 days.  Return precautions were given to return with new or worsening symptoms.  All patient questions were answered.    ____________________________________________  FINAL CLINICAL IMPRESSION(S) / ED DIAGNOSES  Final diagnoses:  Acute maxillary sinusitis, recurrence not specified      NEW MEDICATIONS STARTED DURING THIS VISIT:  ED Discharge Orders         Ordered    amoxicillin-clavulanate (AUGMENTIN) 875-125 MG tablet  2 times daily        08/28/20 1443              This chart was dictated using voice recognition software/Dragon. Despite best efforts to proofread, errors can occur which can change the meaning. Any change was purely unintentional.     Orvil Feil, PA-C 08/28/20 1530    Pia [place], [place] 08/28/20 1531

## 2020-08-28 NOTE — Discharge Instructions (Addendum)
Take Augmentin twice daily for the next ten days.  

## 2020-10-31 ENCOUNTER — Other Ambulatory Visit: Payer: Self-pay

## 2020-10-31 ENCOUNTER — Ambulatory Visit (INDEPENDENT_AMBULATORY_CARE_PROVIDER_SITE_OTHER): Payer: BLUE CROSS/BLUE SHIELD

## 2020-10-31 ENCOUNTER — Ambulatory Visit
Admission: EM | Admit: 2020-10-31 | Discharge: 2020-10-31 | Disposition: A | Discharge status: Home / Self Care | Payer: BLUE CROSS/BLUE SHIELD | Attending: Urgent Care | Admitting: Urgent Care

## 2020-10-31 ENCOUNTER — Ambulatory Visit: Payer: BLUE CROSS/BLUE SHIELD

## 2020-10-31 ENCOUNTER — Encounter: Payer: Self-pay | Admitting: Emergency Medicine

## 2020-10-31 DIAGNOSIS — R0789 Other chest pain: Secondary | ICD-10-CM

## 2020-10-31 DIAGNOSIS — R0602 Shortness of breath: Secondary | ICD-10-CM

## 2020-10-31 DIAGNOSIS — U071 COVID-19: Secondary | ICD-10-CM | POA: Diagnosis not present

## 2020-10-31 NOTE — ED Triage Notes (Signed)
Pt here post covid test positive on 1/17; pt sts some SOB and tightness in chest at times x 2 days

## 2020-10-31 NOTE — Discharge Instructions (Addendum)
°  We will manage this as COVID symptoms with supportive care. For sore throat or cough try using a honey-based tea. Use 3 teaspoons of honey with juice squeezed from half lemon. Place shaved pieces of ginger into 1/2-1 cup of water and warm over stove top. Then mix the ingredients and repeat every 4 hours as needed. Please take Tylenol 500mg -650mg  every 6 hours for aches and pains, fevers. Hydrate very well with at least 2 liters of water. Eat light meals such as soups to replenish electrolytes and soft fruits, veggies. Start an antihistamine like Zyrtec, Allegra or Claritin for postnasal drainage, sinus congestion.  You can take this together with pseudoephedrine (Sudafed) at a dose of 60 mg 2-3 times a day as needed for the same kind of congestion.

## 2020-10-31 NOTE — ED Provider Notes (Signed)
[facility]   MRN: [id number] DOB: [birth year]  Subjective:   Eileen Rios is a 35 y.o. female presenting for 2 day history of acute onset chest tightness, shob. Had a fever initially, tested positive for COVID 19 10/22/2020. Also had fatigue but is resolved now.  Patient wants to make sure that she does not have Covid pneumonia.  One of her family members had this in struggle through it.  Denies history of lung disorder, asthma.  Patient does have chronic sinusitis but is not affecting her now.  Denies smoking cigarettes.  No current facility-administered medications for this encounter.  Current Outpatient Medications:  .  ALPRAZolam (XANAX) 0.5 MG tablet, TAKE ONE-HALF TO ONE TABLET TWICE DAILY AS NEEDED FOR ANXIETY. AVOID TAKING DAILY, SCRIPT SHOULD LAST MUCH LONGER THAN A MONTH., Disp: 60 tablet, Rfl: 0 .  amphetamine-dextroamphetamine (ADDERALL) 10 MG tablet, Take 1 tab twice daily as needed for focus. Avoid taking daily. Need future refills through new primary care provider., Disp: 60 tablet, Rfl: 0 .  B-D INS SYR ULTRAFINE .5CC/30G 30G X 1/2" 0.5 ML MISC, AS DIRECTED, Disp: 90 each, Rfl: 1 .  cetirizine (ZYRTEC) 10 MG tablet, Take 10 mg by mouth daily as needed for allergies., Disp: , Rfl:  .  Chlorphen-PE-Acetaminophen 4-10-325 MG TABS, Take 1 tablet by mouth 3 (three) times daily as needed., Disp: 30 tablet, Rfl: 6 .  Cholecalciferol 5000 units TABS, Take 1 tablet by mouth as needed. , Disp: , Rfl:  .  clindamycin (CLINDAGEL) 1 % gel, Apply 1 application topically 2 (two) times daily., Disp: 30 g, Rfl: 2 .  cyclobenzaprine (FLEXERIL) 5 MG tablet, Take 1 tablet (5 mg total) by mouth 3 (three) times daily as needed for muscle spasms., Disp: 60 tablet, Rfl: 0 .  famotidine (PEPCID) 40 MG tablet, Take 1 tablet (40 mg total) by mouth every evening., Disp: 30 tablet, Rfl: 1 .  labetalol (NORMODYNE) 200 MG tablet, TAKE ONE TABLET TWICE DAILY, Disp: 60 tablet, Rfl: 0 .   metFORMIN (GLUCOPHAGE) 500 MG tablet, Take 500 mg by mouth every evening., Disp: , Rfl:  .  nystatin (NYSTATIN) powder, Apply topically 3 (three) times daily as needed., Disp: 60 g, Rfl: 1 .  nystatin-triamcinolone ointment (MYCOLOG), Apply 1 application topically 2 (two) times daily., Disp: 30 g, Rfl: 1 .  ondansetron (ZOFRAN) 4 MG tablet, TAKE ONE TABLET DAILY AS NEEDED FOR NAUSEA OR VOMITING, Disp: 30 tablet, Rfl: 1 .  predniSONE (DELTASONE) 20 MG tablet, 2 tablets daily for 3 days, 1 tablet daily for 4 days. (Patient not taking: Reported on 10/31/2020), Disp: 10 tablet, Rfl: 0 .  Prenatal MV-Min-Fe Fum-FA-DHA (PRENATAL 1 PO), Take by mouth., Disp: , Rfl:  .  PROCTOZONE-HC 2.5 % rectal cream, APPLY RECTALLY TWICE DAILY AS NEEDED AFTER BOWEL MOVEMENT, Disp: 30 g, Rfl: 0 .  ranitidine (ZANTAC) 300 MG tablet, TAKE ONE TABLET TWICE DAILY, Disp: 60 tablet, Rfl: 1 .  rizatriptan (MAXALT) 10 MG tablet, TAKE ONE TABLET AT START OF MIGRAINE- MAY REPEAT ONCE IN 2 HOURS IF NEEDED, Disp: 10 tablet, Rfl: 0 .  sertraline (ZOLOFT) 50 MG tablet, TAKE ONE TABLET EACH DAY, Disp: 90 tablet, Rfl: 1 .  SUMAtriptan (IMITREX) 6 MG/0.5ML SOLN injection, 0.54mL SUBCUTANEOUSLY FOR HEADACHE MAY REPEAT IN 2 HOURS IF HEADACHE PERSISTS -NO MORE THEN 2 SHOTS IN 24 HOURS, Disp: 8 mL, Rfl: 1   No Known Allergies  Past Medical History:  Diagnosis Date  . Anxiety   . Depression   .  FEMALE INFERTILITY 12/13/2009   Qualifier: Diagnosis of  By: Huntley Dec, Scott    . Headache(784.0)    migraines  . Hypertension   . IBS (irritable bowel syndrome)   . Insulin resistance   . Seasonal allergies   . Urticaria      Past Surgical History:  Procedure Laterality Date  . CESAREAN SECTION N/A 06/05/2013   Procedure: Primary cesarean section with delivery of baby boy at 1033.  Apgars 1/8.;  Surgeon: Genia Del, MD;  Location: [facility];  Service: Obstetrics;  Laterality: N/A;  . WISDOM TOOTH EXTRACTION      Family History   Problem Relation Age of Onset  . Diabetes Mother   . Hypertension Father   . Migraines Neg Hx     Social History   Tobacco Use  . Smoking status: Never Smoker  . Smokeless tobacco: Never Used  Vaping Use  . Vaping Use: Never used  Substance Use Topics  . Alcohol use: No  . Drug use: No    ROS   Objective:   Vitals: BP (!) 143/103 (BP Location: Left Arm)   Pulse 98   Temp 98.6 F (37 C) (Oral)   Resp 18   SpO2 98%   Physical Exam Constitutional:      General: She is not in acute distress.    Appearance: Normal appearance. She is well-developed. She is not ill-appearing, toxic-appearing or diaphoretic.  HENT:     Head: Normocephalic and atraumatic.     Nose: Nose normal.     Mouth/Throat:     Mouth: Mucous membranes are moist.  Eyes:     Extraocular Movements: Extraocular movements intact.     Pupils: Pupils are equal, round, and reactive to light.  Cardiovascular:     Rate and Rhythm: Normal rate and regular rhythm.     Pulses: Normal pulses.     Heart sounds: Normal heart sounds. No murmur heard. No friction rub. No gallop.   Pulmonary:     Effort: Pulmonary effort is normal. No respiratory distress.     Breath sounds: Normal breath sounds. No stridor. No wheezing, rhonchi or rales.  Skin:    General: Skin is warm and dry.     Findings: No rash.  Neurological:     Mental Status: She is alert and oriented to person, place, and time.  Psychiatric:        Mood and Affect: Mood normal.        Behavior: Behavior normal.        Thought Content: Thought content normal.     DG Chest 2 View  Result Date: 10/31/2020 CLINICAL DATA:  Chest pressor, shortness of breath EXAM: CHEST - 2 VIEW COMPARISON:  10/11/2014 FINDINGS: The heart size and mediastinal contours are within normal limits. No focal airspace consolidation, pleural effusion, or pneumothorax. The visualized skeletal structures are unremarkable. IMPRESSION: No active cardiopulmonary disease.  Electronically Signed   By: Duanne Guess D.O.   On: 10/31/2020 13:16    Assessment and Plan :   PDMP not reviewed this encounter.  1. COVID-19   2. Chest tightness   3. Shortness of breath     Recommend supportive care for lingering COVID-19 symptoms.  Chest x-ray is negative, physical exam findings reassuring, vital signs stable for outpatient management. Counseled patient on potential for adverse effects with medications prescribed/recommended today, ER and return-to-clinic precautions discussed, patient verbalized understanding.    Wallis Bamberg, PA-C 10/31/20 1343

## 2021-12-01 ENCOUNTER — Ambulatory Visit
Admission: EM | Admit: 2021-12-01 | Discharge: 2021-12-01 | Disposition: A | Discharge status: Home / Self Care | Payer: 59

## 2021-12-01 ENCOUNTER — Other Ambulatory Visit: Payer: Self-pay

## 2021-12-01 DIAGNOSIS — L239 Allergic contact dermatitis, unspecified cause: Secondary | ICD-10-CM | POA: Diagnosis not present

## 2021-12-01 MED ORDER — PREDNISONE 10 MG (21) PO TBPK: ORAL_TABLET | Freq: Every day | ORAL | 0 refills | Status: DC

## 2021-12-01 NOTE — Discharge Instructions (Signed)
You have been prescribed prednisone steroid for rash.  Please follow-up if symptoms persist or worsen.

## 2021-12-01 NOTE — ED Triage Notes (Signed)
Pt c/o "tight" chest along with rash to face, arms, back onset ~ thurs after working in the yard. States it's itchy despite benadryl.

## 2021-12-01 NOTE — ED Provider Notes (Signed)
[facility]    CSN: [id number] Arrival date & time: 12/01/21  1338      History   Chief Complaint Chief Complaint  Patient presents with   Rash    HPI Eileen Rios is a 36 y.o. female.   Patient presents with itchy rash that is present to back, chest, neck, face, bilateral arms that started yesterday after she was working in the yard.  Patient thinks that she was exposed to poison ivy or poison oak.  Denies any fevers.  Denies any changes to the environment including lotions, soaps, detergents, foods, etc.  Patient has taken Benadryl with minimal improvement.  She denies shortness of breath but reports that it feels like her "chest is tight at times".  Denies any blurry vision.   Rash  Past Medical History:  Diagnosis Date   Anxiety    Depression    FEMALE INFERTILITY 12/13/2009   Qualifier: Diagnosis of  By: Jorene Minors, Scott     Headache(784.0)    migraines   Hypertension    IBS (irritable bowel syndrome)    Insulin resistance    Seasonal allergies    Urticaria     Patient Active Problem List   Diagnosis Date Noted   Hepatic steatosis 07/27/2018   Elevated LFTs 07/13/2018   Vitamin D deficiency 07/08/2018   GERD (gastroesophageal reflux disease) 07/08/2018   Hyperlipidemia 03/08/2018   Insomnia 03/07/2015   Generalized anxiety disorder 11/07/2014   Obesity (BMI 30-39.9) 12/07/2013   Hypertension 05/29/2013   Allergic rhinitis 08/20/2010   Fatigue 07/03/2009   Sinusitis, chronic 10/24/2008   Migraines 10/24/2008   IBS 06/06/2008   ACNE VULGARIS 06/06/2008    Past Surgical History:  Procedure Laterality Date   CESAREAN SECTION N/A 06/05/2013   Procedure: Primary cesarean section with delivery of baby boy at 1033.  Apgars 1/8.;  Surgeon: Princess Bruins, MD;  Location: [facility];  Service: Obstetrics;  Laterality: N/A;   WISDOM TOOTH EXTRACTION      OB History     Gravida  2   Para  1   Term      Preterm  1   AB  1   Living  1       SAB  1   IAB      Ectopic      Multiple      Live Births  1            Home Medications    Prior to Admission medications   Medication Sig Start Date End Date Taking? Authorizing Provider  esomeprazole (NEXIUM) 20 MG capsule Take by mouth. 11/27/20  Yes [provider]  NIFEdipine (PROCARDIA-XL/NIFEDICAL-XL) 30 MG 24 hr tablet Take 1 tablet by mouth daily. 06/11/21  Yes [provider]  Norethindrone Acetate-Ethinyl Estrad-FE (LOESTRIN 24 FE) 1-20 MG-MCG(24) tablet 1 tablet po daily continuously 06/21/21  Yes [provider]  predniSONE (STERAPRED UNI-PAK 21 TAB) 10 MG (21) TBPK tablet Take by mouth daily. Take 6 tabs by mouth daily  for 2 days, then 5 tabs for 2 days, then 4 tabs for 2 days, then 3 tabs for 2 days, 2 tabs for 2 days, then 1 tab by mouth daily for 2 days 12/01/21  Yes Julane Crock, [place] E, FNP  ALPRAZolam (XANAX) 0.5 MG tablet TAKE ONE-HALF TO ONE TABLET TWICE DAILY AS NEEDED FOR ANXIETY. AVOID TAKING DAILY, SCRIPT SHOULD LAST MUCH LONGER THAN A MONTH. 10/05/18   Liane Comber, NP  amphetamine-dextroamphetamine (ADDERALL) 10  MG tablet Take 1 tab twice daily as needed for focus. Avoid taking daily. Need future refills through new primary care provider. 12/10/18   Judd Gaudier, NP  B-D INS SYR ULTRAFINE .5CC/30G 30G X 1/2" 0.5 ML MISC AS DIRECTED 07/04/17   Doree Albee, PA-C  cetirizine (ZYRTEC) 10 MG tablet Take 10 mg by mouth daily as needed for allergies.    [provider]  Chlorphen-PE-Acetaminophen 4-10-325 MG TABS Take 1 tablet by mouth 3 (three) times daily as needed. 11/09/17   Doree Albee, PA-C  Cholecalciferol 5000 units TABS Take 1 tablet by mouth as needed.     [provider]  clindamycin (CLINDAGEL) 1 % gel Apply 1 application topically 2 (two) times daily. 02/11/17   Doree Albee, PA-C  cyclobenzaprine (FLEXERIL) 5 MG tablet Take 1 tablet (5 mg total) by mouth 3 (three) times daily as needed for  muscle spasms. 07/30/18   Judd Gaudier, NP  famotidine (PEPCID) 40 MG tablet Take 1 tablet (40 mg total) by mouth every evening. 10/28/18 10/28/19  Doree Albee, PA-C  hydrOXYzine (ATARAX) 25 MG tablet Take 25 mg by mouth at bedtime. 11/21/21   [provider]  labetalol (NORMODYNE) 200 MG tablet TAKE ONE TABLET TWICE DAILY 12/06/18   Judd Gaudier, NP  metFORMIN (GLUCOPHAGE) 500 MG tablet Take 500 mg by mouth every evening.    [provider]  montelukast (SINGULAIR) 10 MG tablet Take 10 mg by mouth daily. 11/21/21   [provider]  nystatin (NYSTATIN) powder Apply topically 3 (three) times daily as needed. 09/21/18   Elder Negus, NP  nystatin-triamcinolone ointment (MYCOLOG) Apply 1 application topically 2 (two) times daily. 09/24/18   Elder Negus, NP  ondansetron (ZOFRAN) 4 MG tablet TAKE ONE TABLET DAILY AS NEEDED FOR NAUSEA OR VOMITING 12/09/17   Doree Albee, PA-C  Prenatal MV-Min-Fe Fum-FA-DHA (PRENATAL 1 PO) Take by mouth.    [provider]  PROCTOZONE-HC 2.5 % rectal cream APPLY RECTALLY TWICE DAILY AS NEEDED AFTER BOWEL MOVEMENT 02/26/17   Quentin Mulling R, PA-C  ranitidine (ZANTAC) 300 MG tablet TAKE ONE TABLET TWICE DAILY 12/10/18   Judd Gaudier, NP  rizatriptan (MAXALT) 10 MG tablet TAKE ONE TABLET AT START OF MIGRAINE- MAY REPEAT ONCE IN 2 HOURS IF NEEDED 02/24/19   Judd Gaudier, NP  sertraline (ZOLOFT) 50 MG tablet TAKE ONE TABLET EACH DAY 10/05/18   McClanahan, Bella Kennedy, NP  SUMAtriptan (IMITREX) 6 MG/0.5ML SOLN injection 0.34mL SUBCUTANEOUSLY FOR HEADACHE MAY REPEAT IN 2 HOURS IF HEADACHE PERSISTS -NO MORE THEN 2 SHOTS IN 24 HOURS 07/04/17   Doree Albee, PA-C  Drospirenone-Ethinyl Estradiol-Levomefol (SAFYRAL) 3-0.03-0.451 MG tablet Take 1 tablet by mouth daily. 03/21/14 03/21/14  Doree Albee, PA-C    Family History Family History  Problem Relation Age of Onset   Diabetes Mother    Hypertension Father    Migraines  Neg Hx     Social History Social History   Tobacco Use   Smoking status: Never   Smokeless tobacco: Never  Vaping Use   Vaping Use: Never used  Substance Use Topics   Alcohol use: No   Drug use: No     Allergies   Patient has no known allergies.   Review of Systems Review of Systems Per HPI  Physical Exam Triage Vital Signs ED Triage Vitals [12/01/21 1430]  Enc Vitals Group     BP (!) 155/101     Pulse Rate (!) 109  Resp 16     Temp 98.8 F (37.1 C)     Temp Source Oral     SpO2 95 %     Weight      Height      Head Circumference      Peak Flow      Pain Score 0     Pain Loc      Pain Edu?      Excl. in [place]?    No data found.  Updated Vital Signs BP (!) 155/101 (BP Location: Left Arm)    Pulse (!) 109    Temp 98.8 F (37.1 C) (Oral)    Resp 16    SpO2 95%   Visual Acuity Right Eye Distance:   Left Eye Distance:   Bilateral Distance:    Right Eye Near:   Left Eye Near:    Bilateral Near:     Physical Exam Constitutional:      General: She is not in acute distress.    Appearance: Normal appearance. She is not toxic-appearing or diaphoretic.  HENT:     Head: Normocephalic and atraumatic.  Eyes:     General: Lids are everted, no foreign bodies appreciated. Vision grossly intact. Gaze aligned appropriately.     Extraocular Movements: Extraocular movements intact.     Conjunctiva/sclera: Conjunctivae normal.     Pupils: Pupils are equal, round, and reactive to light.  Cardiovascular:     Rate and Rhythm: Normal rate and regular rhythm.     Pulses: Normal pulses.     Heart sounds: Normal heart sounds.  Pulmonary:     Effort: Pulmonary effort is normal. No respiratory distress.     Breath sounds: Normal breath sounds.  Skin:    Comments: Patient has maculopapular rash present to left lower back, bilateral upper extremities, chest, neck, bilateral cheeks of face, forehead, left eyelid.  Neurological:     General: No focal deficit present.      Mental Status: She is alert and oriented to person, place, and time. Mental status is at baseline.  Psychiatric:        Mood and Affect: Mood normal.        Behavior: Behavior normal.        Thought Content: Thought content normal.        Judgment: Judgment normal.     UC Treatments / Results  Labs (all labs ordered are listed, but only abnormal results are displayed) Labs Reviewed - No data to display  EKG   Radiology No results found.  Procedures Procedures (including critical care time)  Medications Ordered in UC Medications - No data to display  Initial Impression / Assessment and Plan / UC Course  I have reviewed the triage vital signs and the nursing notes.  Pertinent labs & imaging results that were available during my care of the patient were reviewed by me and considered in my medical decision making (see chart for details).     Physical exam is consistent with allergic contact dermatitis most likely due to plants.  Patient is not in respiratory distress, speaking in complete sentences, and there are no adventitious lung sounds do not think that patient is in need of EpiPen or any emergent medical attention at hospital.  Will treat with prednisone steroid taper.  Discussed strict return and ER precautions.  Patient has appointment with an eye doctor tomorrow which is beneficial as rash is overlying left eyelid but there are no obvious abnormalities to the  eye at this time.  Encouraged to keep this appointment with eye doctor tomorrow. Visual acuity appears normal.  Patient verbalized understanding and was agreeable with plan. Final Clinical Impressions(s) / UC Diagnoses   Final diagnoses:  Allergic contact dermatitis, unspecified trigger     Discharge Instructions      You have been prescribed prednisone steroid for rash.  Please follow-up if symptoms persist or worsen.    ED Prescriptions     Medication Sig Dispense Auth. Provider   predniSONE (STERAPRED  UNI-PAK 21 TAB) 10 MG (21) TBPK tablet Take by mouth daily. Take 6 tabs by mouth daily  for 2 days, then 5 tabs for 2 days, then 4 tabs for 2 days, then 3 tabs for 2 days, 2 tabs for 2 days, then 1 tab by mouth daily for 2 days 42 tablet [place], Michele Rockers, [place]      PDMP not reviewed this encounter.   Teodora Medici, [place] 12/01/21 1452

## 2021-12-27 ENCOUNTER — Other Ambulatory Visit: Payer: Self-pay

## 2021-12-27 ENCOUNTER — Ambulatory Visit
Admission: EM | Admit: 2021-12-27 | Discharge: 2021-12-27 | Disposition: A | Discharge status: Home / Self Care | Payer: 59 | Attending: Physician Assistant | Admitting: Physician Assistant

## 2021-12-27 DIAGNOSIS — H65193 Other acute nonsuppurative otitis media, bilateral: Secondary | ICD-10-CM | POA: Diagnosis not present

## 2021-12-27 DIAGNOSIS — J329 Chronic sinusitis, unspecified: Secondary | ICD-10-CM

## 2021-12-27 DIAGNOSIS — H9202 Otalgia, left ear: Secondary | ICD-10-CM

## 2021-12-27 MED ORDER — PREDNISONE 20 MG PO TABS: 40.0000 mg | ORAL_TABLET | Freq: Every day | ORAL | 0 refills | Status: AC

## 2021-12-27 MED ORDER — AMOXICILLIN-POT CLAVULANATE 875-125 MG PO TABS: 1.0000 | ORAL_TABLET | Freq: Two times a day (BID) | ORAL | 0 refills | Status: DC

## 2021-12-27 NOTE — ED Provider Notes (Signed)
?[facility] ? ? ? ?CSN: [id number] ?Arrival date & time: 12/27/21  1243 ? ? ?  ? ?History   ?Chief Complaint ?Chief Complaint  ?Patient presents with  ? left ear swelling  ? ? ?HPI ?Eileen Cales is a 36 y.o. female.  ? ?Patient has a history of chronic sinusitis and reports she has had significant worsening of symptoms over the past several days.  She reports nasal congestion and cough which is at baseline but has developed left otalgia with associated swelling and pain surrounding her left ear.  She denies any otorrhea.  She does not use earbuds or earplugs on a regular basis.  She does not use Q-tips regularly.  She does take allergy medication on a regular basis but this is not improved symptoms.  She denies any recent antibiotic use.  She denies any recent swimming or airplane travel.  She is having difficulty with daily activities as result of symptoms.  She is confident that she is not pregnant. ? ? ?Past Medical History:  ?Diagnosis Date  ? Anxiety   ? Depression   ? FEMALE INFERTILITY 12/13/2009  ? Qualifier: Diagnosis of  By: Huntley Dec, Scott    ? Headache(784.0)   ? migraines  ? Hypertension   ? IBS (irritable bowel syndrome)   ? Insulin resistance   ? Seasonal allergies   ? Urticaria   ? ? ?Patient Active Problem List  ? Diagnosis Date Noted  ? Hepatic steatosis 07/27/2018  ? Elevated LFTs 07/13/2018  ? Vitamin D deficiency 07/08/2018  ? GERD (gastroesophageal reflux disease) 07/08/2018  ? Hyperlipidemia 03/08/2018  ? Insomnia 03/07/2015  ? Generalized anxiety disorder 11/07/2014  ? Obesity (BMI 30-39.9) 12/07/2013  ? Hypertension 05/29/2013  ? Allergic rhinitis 08/20/2010  ? Fatigue 07/03/2009  ? Sinusitis, chronic 10/24/2008  ? Migraines 10/24/2008  ? IBS 06/06/2008  ? ACNE VULGARIS 06/06/2008  ? ? ?Past Surgical History:  ?Procedure Laterality Date  ? CESAREAN SECTION N/A 06/05/2013  ? Procedure: Primary cesarean section with delivery of baby boy at 75.  Apgars 1/8.;  Surgeon:  Genia Del, MD;  Location: [facility];  Service: Obstetrics;  Laterality: N/A;  ? WISDOM TOOTH EXTRACTION    ? ? ?OB History   ? ? Gravida  ?2  ? Para  ?1  ? Term  ?   ? Preterm  ?1  ? AB  ?1  ? Living  ?1  ?  ? ? SAB  ?1  ? IAB  ?   ? Ectopic  ?   ? Multiple  ?   ? Live Births  ?1  ?   ?  ?  ? ? ? ?Home Medications   ? ?Prior to Admission medications   ?Medication Sig Start Date End Date Taking? Authorizing Provider  ?amoxicillin-clavulanate (AUGMENTIN) 875-125 MG tablet Take 1 tablet by mouth every 12 (twelve) hours. 12/27/21  Yes Jkai Arwood K, PA-C  ?predniSONE (DELTASONE) 20 MG tablet Take 2 tablets (40 mg total) by mouth daily for 4 days. 12/27/21 12/31/21 Yes Reveca Desmarais, Noberto Retort, PA-C  ?ALPRAZolam (XANAX) 0.5 MG tablet TAKE ONE-HALF TO ONE TABLET TWICE DAILY AS NEEDED FOR ANXIETY. AVOID TAKING DAILY, SCRIPT SHOULD LAST MUCH LONGER THAN A MONTH. 10/05/18   Judd Gaudier, NP  ?amphetamine-dextroamphetamine (ADDERALL) 10 MG tablet Take 1 tab twice daily as needed for focus. Avoid taking daily. Need future refills through new primary care provider. 12/10/18   Judd Gaudier, NP  ?cetirizine (ZYRTEC) 10 MG tablet Take 10  mg by mouth daily as needed for allergies.    [provider]  ?clindamycin (CLINDAGEL) 1 % gel Apply 1 application topically 2 (two) times daily. 02/11/17   Doree Albeeollier, Amanda R, PA-C  ?esomeprazole (NEXIUM) 20 MG capsule Take by mouth. 11/27/20   [provider]  ?hydrOXYzine (ATARAX) 25 MG tablet Take 25 mg by mouth at bedtime. 11/21/21   [provider]  ?metFORMIN (GLUCOPHAGE) 500 MG tablet Take 500 mg by mouth every evening.    [provider]  ?montelukast (SINGULAIR) 10 MG tablet Take 10 mg by mouth daily. 11/21/21   [provider]  ?NIFEdipine (PROCARDIA-XL/NIFEDICAL-XL) 30 MG 24 hr tablet Take 1 tablet by mouth daily. 06/11/21   [provider]  ?Norethindrone Acetate-Ethinyl Estrad-FE (LOESTRIN 24 FE) 1-20 MG-MCG(24) tablet 1 tablet po daily  continuously 06/21/21   [provider]  ?rizatriptan (MAXALT) 10 MG tablet TAKE ONE TABLET AT START OF MIGRAINE- MAY REPEAT ONCE IN 2 HOURS IF NEEDED 02/24/19   Judd Gaudierorbett, Ashley, NP  ?Drospirenone-Ethinyl Estradiol-Levomefol (SAFYRAL) 3-0.03-0.451 MG tablet Take 1 tablet by mouth daily. 03/21/14 03/21/14  Doree Albeeollier, Amanda R, PA-C  ? ? ?Family History ?Family History  ?Problem Relation Age of Onset  ? Diabetes Mother   ? Hypertension Father   ? Migraines Neg Hx   ? ? ?Social History ?Social History  ? ?Tobacco Use  ? Smoking status: Never  ? Smokeless tobacco: Never  ?Vaping Use  ? Vaping Use: Never used  ?Substance Use Topics  ? Alcohol use: No  ? Drug use: No  ? ? ? ?Allergies   ?Patient has no known allergies. ? ? ?Review of Systems ?Review of Systems  ?Constitutional:  Positive for activity change. Negative for appetite change, fatigue and fever.  ?HENT:  Positive for congestion, ear pain, facial swelling and sinus pressure. Negative for ear discharge, sneezing and sore throat.   ?Respiratory:  Positive for cough. Negative for shortness of breath.   ?Cardiovascular:  Negative for chest pain.  ?Gastrointestinal:  Negative for abdominal pain, diarrhea, nausea and vomiting.  ?Neurological:  Positive for headaches. Negative for dizziness and light-headedness.  ? ? ?Physical Exam ?Triage Vital Signs ?ED Triage Vitals  ?Enc Vitals Group  ?   BP 12/27/21 1353 (!) 141/105  ?   Pulse Rate 12/27/21 1353 89  ?   Resp 12/27/21 1353 20  ?   Temp 12/27/21 1353 98.3 ?F (36.8 ?C)  ?   Temp Source 12/27/21 1353 Oral  ?   SpO2 12/27/21 1353 95 %  ?   Weight --   ?   Height --   ?   Head Circumference --   ?   Peak Flow --   ?   Pain Score 12/27/21 1357 0  ?   Pain Loc --   ?   Pain Edu? --   ?   Excl. in [place]? --   ? ?No data found. ? ?Updated Vital Signs ?BP (!) 141/105 (BP Location: Left Arm)   Pulse 89   Temp 98.3 ?F (36.8 ?C) (Oral)   Resp 20   SpO2 95%  ? ?Visual Acuity ?Right Eye Distance:   ?Left Eye Distance:    ?Bilateral Distance:   ? ?Right Eye Near:   ?Left Eye Near:    ?Bilateral Near:    ? ?Physical Exam ?Vitals reviewed.  ?Constitutional:   ?   General: She is awake. She is not in acute distress. ?   Appearance: Normal appearance. She  is well-developed. She is not ill-appearing.  ?   Comments: Very pleasant female appears stated age in no acute distress sitting comfortably in exam room  ?HENT:  ?   Head: Normocephalic and atraumatic.  ?   Right Ear: Ear canal and external ear normal. A middle ear effusion is present. Tympanic membrane is not erythematous or bulging.  ?   Left Ear: Ear canal and external ear normal. A middle ear effusion is present. Tympanic membrane is not erythematous or bulging.  ?   Nose:  ?   Right Sinus: Maxillary sinus tenderness present. No frontal sinus tenderness.  ?   Left Sinus: Maxillary sinus tenderness present. No frontal sinus tenderness.  ?   Mouth/Throat:  ?   Pharynx: Uvula midline. Posterior oropharyngeal erythema present. No oropharyngeal exudate.  ?Cardiovascular:  ?   Rate and Rhythm: Normal rate and regular rhythm.  ?   Heart sounds: Normal heart sounds, S1 normal and S2 normal. No murmur heard. ?Pulmonary:  ?   Effort: Pulmonary effort is normal.  ?   Breath sounds: Normal breath sounds. No wheezing, rhonchi or rales.  ?   Comments: Clear to auscultation bilaterally ?Lymphadenopathy:  ?   Head:  ?   Right side of head: No submental, submandibular, tonsillar, preauricular, posterior auricular or occipital adenopathy.  ?   Left side of head: Posterior auricular adenopathy present. No submental, submandibular, tonsillar, preauricular or occipital adenopathy.  ?   Cervical: No cervical adenopathy.  ?Psychiatric:     ?   Behavior: Behavior is cooperative.  ? ? ? ?UC Treatments / Results  ?Labs ?(all labs ordered are listed, but only abnormal results are displayed) ?Labs Reviewed - No data to display ? ?EKG ? ? ?Radiology ?No results found. ? ?Procedures ?Procedures (including  critical care time) ? ?Medications Ordered in UC ?Medications - No data to display ? ?Initial Impression / Assessment and Plan / UC Course  ?I have reviewed the triage vital signs and the nursing notes. ? ?Pertinent labs & ima

## 2021-12-27 NOTE — Discharge Instructions (Signed)
Start Augmentin twice daily to cover for infection.  Start prednisone 40 mg for 4 days.  Do not take NSAIDs including aspirin, ibuprofen/Advil, naproxen/Aleve with this medication as it can cause stomach bleeding.  You can use Tylenol for breakthrough pain.  I also recommend allergy medication including antihistamine and Flonase for symptom relief.  Make sure you rest and drink plenty of fluid.  If your symptoms are not improving quickly please follow-up with ENT; call to schedule an appointment.  If you have any severe symptoms please return for reevaluation. ?

## 2021-12-27 NOTE — ED Triage Notes (Signed)
Pt c/o swelling around the left ear moving into face first noticed wed evening. States it is painful and getting worse. Denies drainage from ear.  ?

## 2022-10-04 ENCOUNTER — Other Ambulatory Visit: Payer: Self-pay

## 2022-10-04 ENCOUNTER — Ambulatory Visit
Admission: EM | Admit: 2022-10-04 | Discharge: 2022-10-04 | Disposition: A | Discharge status: Home / Self Care | Payer: BLUE CROSS/BLUE SHIELD | Attending: Internal Medicine | Admitting: Internal Medicine

## 2022-10-04 ENCOUNTER — Encounter: Payer: Self-pay | Admitting: Emergency Medicine

## 2022-10-04 DIAGNOSIS — J328 Other chronic sinusitis: Secondary | ICD-10-CM

## 2022-10-04 MED ORDER — AMOXICILLIN-POT CLAVULANATE 875-125 MG PO TABS: 1.0000 | ORAL_TABLET | Freq: Two times a day (BID) | ORAL | 0 refills | Status: AC

## 2022-10-04 NOTE — ED Provider Notes (Signed)
[facility]    CSN: [id number] Arrival date & time: 10/04/22  0910      History   Chief Complaint Chief Complaint  Patient presents with   Nasal Congestion    HPI Eileen Rios is a 36 y.o. female.   Patient presents with nasal congestion, sinus pressure, lymph node swelling, nonproductive cough, chills that started about 4 days ago.  Patient reports that she has taken several over-the-counter cold and flu medications with minimal improvement in symptoms.  She reports history of chronic sinusitis and is concerned about sinus infection.  Last sinus infection was a few months prior and was treated with amoxicillin antibiotic.  She denies any documented fevers or known sick contacts at home.  Denies chest pain, shortness of breath, nausea, vomiting, diarrhea, abdominal pain.  Reports lymph node swelling has been in the occipital portion of the neck as well as in the right lateral portion of the neck.  Denies history of asthma or COPD and patient does not smoke cigarettes.  Reports that sinus pressure is mainly present on the right side of the face and developed yesterday.     Past Medical History:  Diagnosis Date   Anxiety    Depression    FEMALE INFERTILITY 12/13/2009   Qualifier: Diagnosis of  By: Huntley Dec, Scott     Headache(784.0)    migraines   Hypertension    IBS (irritable bowel syndrome)    Insulin resistance    Seasonal allergies    Urticaria     Patient Active Problem List   Diagnosis Date Noted   Hepatic steatosis 07/27/2018   Elevated LFTs 07/13/2018   Vitamin D deficiency 07/08/2018   GERD (gastroesophageal reflux disease) 07/08/2018   Hyperlipidemia 03/08/2018   Insomnia 03/07/2015   Generalized anxiety disorder 11/07/2014   Obesity (BMI 30-39.9) 12/07/2013   Hypertension 05/29/2013   Allergic rhinitis 08/20/2010   Fatigue 07/03/2009   Sinusitis, chronic 10/24/2008   Migraines 10/24/2008   IBS 06/06/2008   ACNE VULGARIS 06/06/2008     Past Surgical History:  Procedure Laterality Date   CESAREAN SECTION N/A 06/05/2013   Procedure: Primary cesarean section with delivery of baby boy at 1033.  Apgars 1/8.;  Surgeon: Genia Del, MD;  Location: [facility];  Service: Obstetrics;  Laterality: N/A;   WISDOM TOOTH EXTRACTION      OB History     Gravida  2   Para  1   Term      Preterm  1   AB  1   Living  1      SAB  1   IAB      Ectopic      Multiple      Live Births  1            Home Medications    Prior to Admission medications   Medication Sig Start Date End Date Taking? Authorizing Provider  amoxicillin-clavulanate (AUGMENTIN) 875-125 MG tablet Take 1 tablet by mouth every 12 (twelve) hours. 10/04/22  Yes Niquita Digioia, Acie Fredrickson, FNP  ALPRAZolam (XANAX) 0.5 MG tablet TAKE ONE-HALF TO ONE TABLET TWICE DAILY AS NEEDED FOR ANXIETY. AVOID TAKING DAILY, SCRIPT SHOULD LAST MUCH LONGER THAN A MONTH. 10/05/18   Judd Gaudier, NP  amphetamine-dextroamphetamine (ADDERALL) 10 MG tablet Take 1 tab twice daily as needed for focus. Avoid taking daily. Need future refills through new primary care provider. 12/10/18   Judd Gaudier, NP  cetirizine (ZYRTEC) 10 MG tablet Take 10 mg by  mouth daily as needed for allergies.    [provider]  clindamycin (CLINDAGEL) 1 % gel Apply 1 application topically 2 (two) times daily. 02/11/17   Doree Albee, PA-C  esomeprazole (NEXIUM) 20 MG capsule Take by mouth. 11/27/20   [provider]  hydrOXYzine (ATARAX) 25 MG tablet Take 25 mg by mouth at bedtime. 11/21/21   [provider]  metFORMIN (GLUCOPHAGE) 500 MG tablet Take 500 mg by mouth every evening.    [provider]  montelukast (SINGULAIR) 10 MG tablet Take 10 mg by mouth daily. 11/21/21   [provider]  NIFEdipine (PROCARDIA-XL/NIFEDICAL-XL) 30 MG 24 hr tablet Take 1 tablet by mouth daily. 06/11/21   [provider]  Norethindrone Acetate-Ethinyl Estrad-FE  (LOESTRIN 24 FE) 1-20 MG-MCG(24) tablet 1 tablet po daily continuously 06/21/21   [provider]  rizatriptan (MAXALT) 10 MG tablet TAKE ONE TABLET AT START OF MIGRAINE- MAY REPEAT ONCE IN 2 HOURS IF NEEDED 02/24/19   Judd Gaudier, NP  Drospirenone-Ethinyl Estradiol-Levomefol (SAFYRAL) 3-0.03-0.451 MG tablet Take 1 tablet by mouth daily. 03/21/14 03/21/14  Doree Albee, PA-C    Family History Family History  Problem Relation Age of Onset   Diabetes Mother    Hypertension Father    Migraines Neg Hx     Social History Social History   Tobacco Use   Smoking status: Never   Smokeless tobacco: Never  Vaping Use   Vaping Use: Never used  Substance Use Topics   Alcohol use: No   Drug use: No     Allergies   Patient has no known allergies.   Review of Systems Review of Systems Per HPI  Physical Exam Triage Vital Signs ED Triage Vitals  Enc Vitals Group     BP 10/04/22 1240 (!) 146/97     Pulse Rate 10/04/22 1240 94     Resp 10/04/22 1240 18     Temp 10/04/22 1240 98.4 F (36.9 C)     Temp Source 10/04/22 1240 Oral     SpO2 10/04/22 1240 97 %     Weight --      Height --      Head Circumference --      Peak Flow --      Pain Score 10/04/22 1241 2     Pain Loc --      Pain Edu? --      Excl. in GC? --    No data found.  Updated Vital Signs BP (!) 146/97 (BP Location: Left Arm)   Pulse 94   Temp 98.4 F (36.9 C) (Oral)   Resp 18   SpO2 97%   Visual Acuity Right Eye Distance:   Left Eye Distance:   Bilateral Distance:    Right Eye Near:   Left Eye Near:    Bilateral Near:     Physical Exam Constitutional:      General: She is not in acute distress.    Appearance: Normal appearance. She is not toxic-appearing or diaphoretic.  HENT:     Head: Normocephalic and atraumatic.     Right Ear: Tympanic membrane and ear canal normal.     Left Ear: Tympanic membrane and ear canal normal.     Nose: Congestion present.     Right Sinus: Maxillary  sinus tenderness and frontal sinus tenderness present.     Left Sinus: Maxillary sinus tenderness and frontal sinus tenderness present.     Mouth/Throat:     Mouth: Mucous  membranes are moist.     Pharynx: No posterior oropharyngeal erythema.  Eyes:     Extraocular Movements: Extraocular movements intact.     Conjunctiva/sclera: Conjunctivae normal.     Pupils: Pupils are equal, round, and reactive to light.  Cardiovascular:     Rate and Rhythm: Normal rate and regular rhythm.     Pulses: Normal pulses.     Heart sounds: Normal heart sounds.  Pulmonary:     Effort: Pulmonary effort is normal. No respiratory distress.     Breath sounds: Normal breath sounds. No stridor. No wheezing, rhonchi or rales.  Abdominal:     General: Abdomen is flat. Bowel sounds are normal.     Palpations: Abdomen is soft.  Musculoskeletal:        General: Normal range of motion.     Cervical back: Normal range of motion.  Lymphadenopathy:     Head:     Right side of head: No occipital adenopathy.     Left side of head: No occipital adenopathy.     Cervical: Cervical adenopathy present.     Right cervical: Superficial cervical adenopathy present.  Skin:    General: Skin is warm and dry.  Neurological:     General: No focal deficit present.     Mental Status: She is alert and oriented to person, place, and time. Mental status is at baseline.  Psychiatric:        Mood and Affect: Mood normal.        Behavior: Behavior normal.      UC Treatments / Results  Labs (all labs ordered are listed, but only abnormal results are displayed) Labs Reviewed - No data to display  EKG   Radiology No results found.  Procedures Procedures (including critical care time)  Medications Ordered in UC Medications - No data to display  Initial Impression / Assessment and Plan / UC Course  I have reviewed the triage vital signs and the nursing notes.  Pertinent labs & imaging results that were available during  my care of the patient were reviewed by me and considered in my medical decision making (see chart for details).     Differential diagnoses include viral upper respiratory infection versus chronic sinusitis.  I am concerned for development of secondary bacterial infection/sinus infection given patient's history of chronic and persistent sinus infections and associated sinus pressure currently.  Did discuss with patient the possibility of viral illness as well as supportive care associated with this.  Will opt to treat with Augmentin antibiotic given concern for sinus infection though.  Patient declined COVID testing.  Discussed return precautions.  Patient verbalized understanding and was agreeable with plan. Final Clinical Impressions(s) / UC Diagnoses   Final diagnoses:  Other chronic sinusitis     Discharge Instructions      I have prescribed an antibiotic for upper respiratory infection.  Please follow-up if symptoms persist or worsen.    ED Prescriptions     Medication Sig Dispense Auth. Provider   amoxicillin-clavulanate (AUGMENTIN) 875-125 MG tablet Take 1 tablet by mouth every 12 (twelve) hours. 14 tablet Winfred, Acie Fredrickson, [place]      PDMP not reviewed this encounter.   Gustavus Bryant, [place] 10/04/22 1339

## 2022-10-04 NOTE — Discharge Instructions (Signed)
I have prescribed an antibiotic for upper respiratory infection.  Please follow-up if symptoms persist or worsen.

## 2022-10-04 NOTE — ED Triage Notes (Signed)
Pt sts nasal congestion and sinus pressure x 4 days with some chills

## 2023-01-26 ENCOUNTER — Emergency Department (HOSPITAL_BASED_OUTPATIENT_CLINIC_OR_DEPARTMENT_OTHER): Payer: BLUE CROSS/BLUE SHIELD

## 2023-01-26 ENCOUNTER — Encounter (HOSPITAL_BASED_OUTPATIENT_CLINIC_OR_DEPARTMENT_OTHER): Payer: Self-pay | Admitting: Urology

## 2023-01-26 ENCOUNTER — Emergency Department (HOSPITAL_BASED_OUTPATIENT_CLINIC_OR_DEPARTMENT_OTHER)
Admission: EM | Admit: 2023-01-26 | Discharge: 2023-01-26 | Disposition: A | Discharge status: Home / Self Care | Payer: BLUE CROSS/BLUE SHIELD | Attending: Emergency Medicine | Admitting: Emergency Medicine

## 2023-01-26 ENCOUNTER — Other Ambulatory Visit: Payer: Self-pay

## 2023-01-26 DIAGNOSIS — Z79899 Other long term (current) drug therapy: Secondary | ICD-10-CM | POA: Insufficient documentation

## 2023-01-26 DIAGNOSIS — I159 Secondary hypertension, unspecified: Secondary | ICD-10-CM

## 2023-01-26 DIAGNOSIS — R519 Headache, unspecified: Secondary | ICD-10-CM

## 2023-01-26 DIAGNOSIS — I10 Essential (primary) hypertension: Secondary | ICD-10-CM | POA: Insufficient documentation

## 2023-01-26 DIAGNOSIS — R030 Elevated blood-pressure reading, without diagnosis of hypertension: Secondary | ICD-10-CM

## 2023-01-26 LAB — BASIC METABOLIC PANEL
Anion gap: 10 (ref 5–15)
BUN: 10 mg/dL (ref 6–20)
CO2: 25 mmol/L (ref 22–32)
Calcium: 9 mg/dL (ref 8.9–10.3)
Chloride: 99 mmol/L (ref 98–111)
Creatinine, Ser: 0.78 mg/dL (ref 0.44–1.00)
GFR, Estimated: >60 mL/min (ref >60)
Glucose, Bld: 107 mg/dL — ABNORMAL HIGH (ref 70–99)
Potassium: 3.5 mmol/L (ref 3.5–5.1)
Sodium: 134 mmol/L — ABNORMAL LOW (ref 135–145)

## 2023-01-26 LAB — CBC
HCT: 41 % (ref 36.0–46.0)
Hemoglobin: 13.8 g/dL (ref 12.0–15.0)
MCH: 26.8 pg (ref 26.0–34.0)
MCHC: 33.7 g/dL (ref 30.0–36.0)
MCV: 79.6 fL — ABNORMAL LOW (ref 80.0–100.0)
Platelets: 365 10*3/uL (ref 150–400)
RBC: 5.15 MIL/uL — ABNORMAL HIGH (ref 3.87–5.11)
RDW: 13.8 % (ref 11.5–15.5)
WBC: 8.6 10*3/uL (ref 4.0–10.5)
nRBC: 0 % (ref 0.0–0.2)

## 2023-01-26 LAB — PREGNANCY, URINE: Preg Test, Ur: NEGATIVE

## 2023-01-26 LAB — TROPONIN I (HIGH SENSITIVITY)
Troponin I (High Sensitivity): 4 ng/L (ref <18)
Troponin I (High Sensitivity): 6 ng/L (ref <18)

## 2023-01-26 LAB — RAPID URINE DRUG SCREEN, HOSP PERFORMED
Amphetamines: NOT DETECTED
Barbiturates: NOT DETECTED
Benzodiazepines: POSITIVE — AB
Cocaine: NOT DETECTED
Opiates: NOT DETECTED
Tetrahydrocannabinol: NOT DETECTED

## 2023-01-26 MED ORDER — KETOROLAC TROMETHAMINE 30 MG/ML IJ SOLN
15.0000 mg | Freq: Once | INTRAMUSCULAR | Status: AC
  Administered 2023-01-26: 15 mg via INTRAVENOUS
  Filled 2023-01-26: qty 1

## 2023-01-26 MED ORDER — METOCLOPRAMIDE HCL 5 MG/ML IJ SOLN
5.0000 mg | Freq: Once | INTRAMUSCULAR | Status: AC
  Administered 2023-01-26: 5 mg via INTRAVENOUS
  Filled 2023-01-26: qty 2

## 2023-01-26 MED ORDER — SODIUM CHLORIDE 0.9 % IV BOLUS
500.0000 mL | Freq: Once | INTRAVENOUS | Status: AC
Start: 2023-01-26 — End: 2023-01-26
  Administered 2023-01-26: 500 mL via INTRAVENOUS

## 2023-01-26 MED ORDER — DIPHENHYDRAMINE HCL 50 MG/ML IJ SOLN
12.5000 mg | Freq: Once | INTRAMUSCULAR | Status: AC
  Administered 2023-01-26: 12.5 mg via INTRAVENOUS
  Filled 2023-01-26: qty 1

## 2023-01-26 NOTE — ED Provider Notes (Signed)
[facility] Provider Note   CSN: [id number] Arrival date & time: 01/26/23  1320     History  Chief Complaint  Patient presents with   Hypertension    Eileen Rios is a 37 y.o. female who presents urgency department for complaints of racing heart, hypertension and headache.  She has a past medical history of hypertension, anxiety, and migraine headaches.  She takes nifedipine which she has been on for years.  She also takes Maxalt and Adderall for narcolepsy.  Patient reports that she has had intermittent episodes of palpitations in the past which she symptoms expects to have given taking her Adderall however she has had increased rate and links of having her palpitations over the past several days.  She denies any chest pain or shortness of breath but states that her blood pressure has also been elevated.  She called her PCP who told her to come to the ER today she has no changes in her medication after stopping her Adderall for the past 2 days because she thought it might be contributing to her symptoms.  While waiting here in the emergency department patient also developed migraine headache.  She has a throbbing global headache which is similar to symptoms she normally has.  She has a little bit of blurry vision.  She denies fever, neck stiffness, rash,   Hypertension       Home Medications Prior to Admission medications   Medication Sig Start Date End Date Taking? Authorizing Provider  ALPRAZolam (XANAX) 0.5 MG tablet TAKE ONE-HALF TO ONE TABLET TWICE DAILY AS NEEDED FOR ANXIETY. AVOID TAKING DAILY, SCRIPT SHOULD LAST MUCH LONGER THAN A MONTH. 10/05/18   Judd Gaudier, NP  amoxicillin-clavulanate (AUGMENTIN) 875-125 MG tablet Take 1 tablet by mouth every 12 (twelve) hours. 10/04/22   Gustavus Bryant, FNP  amphetamine-dextroamphetamine (ADDERALL) 10 MG tablet Take 1 tab twice daily as needed for focus. Avoid taking daily. Need future  refills through new primary care provider. 12/10/18   Judd Gaudier, NP  cetirizine (ZYRTEC) 10 MG tablet Take 10 mg by mouth daily as needed for allergies.    [provider]  clindamycin (CLINDAGEL) 1 % gel Apply 1 application topically 2 (two) times daily. 02/11/17   Doree Albee, PA-C  esomeprazole (NEXIUM) 20 MG capsule Take by mouth. 11/27/20   [provider]  hydrOXYzine (ATARAX) 25 MG tablet Take 25 mg by mouth at bedtime. 11/21/21   [provider]  metFORMIN (GLUCOPHAGE) 500 MG tablet Take 500 mg by mouth every evening.    [provider]  montelukast (SINGULAIR) 10 MG tablet Take 10 mg by mouth daily. 11/21/21   [provider]  NIFEdipine (PROCARDIA-XL/NIFEDICAL-XL) 30 MG 24 hr tablet Take 1 tablet by mouth daily. 06/11/21   [provider]  Norethindrone Acetate-Ethinyl Estrad-FE (LOESTRIN 24 FE) 1-20 MG-MCG(24) tablet 1 tablet po daily continuously 06/21/21   [provider]  rizatriptan (MAXALT) 10 MG tablet TAKE ONE TABLET AT START OF MIGRAINE- MAY REPEAT ONCE IN 2 HOURS IF NEEDED 02/24/19   Judd Gaudier, NP  Drospirenone-Ethinyl Estradiol-Levomefol (SAFYRAL) 3-0.03-0.451 MG tablet Take 1 tablet by mouth daily. 03/21/14 03/21/14  Doree Albee, PA-C      Allergies    Patient has no known allergies.    Review of Systems   Review of Systems  Physical Exam Updated Vital Signs BP (!) 132/101 (BP Location: Right Arm)   Pulse (!) 102   Temp 98.3  F (36.8 C) (Oral)   Resp 18   Ht  (1.626 m)   Wt 95.3 kg   SpO2 99%   BMI 36.06 kg/m  Physical Exam Physical Exam  Constitutional: Pt is oriented to person, place, and time. Pt appears well-developed and well-nourished. No distress.  HENT:  Head: Normocephalic and atraumatic.  Mouth/Throat: Oropharynx is clear and moist.  Eyes: Conjunctivae and EOM are normal. Pupils are equal, round, and reactive to light. No scleral icterus.  No horizontal, vertical or  rotational nystagmus  Neck: Normal range of motion. Neck supple.  Full active and passive ROM without pain No midline or paraspinal tenderness No nuchal rigidity or meningeal signs  Cardiovascular: Normal rate, regular rhythm and intact distal pulses.   Pulmonary/Chest: Effort normal and breath sounds normal. No respiratory distress. Pt has no wheezes. No rales.  Abdominal: Soft. Bowel sounds are normal. There is no tenderness. There is no rebound and no guarding.  Musculoskeletal: Normal range of motion.  Lymphadenopathy:    No cervical adenopathy.  Neurological: Pt. is alert and oriented to person, place, and time. He has normal reflexes. No cranial nerve deficit.  Exhibits normal muscle tone. Coordination normal.  Mental Status:  Alert, oriented, thought content appropriate. Speech fluent without evidence of aphasia. Able to follow 2 step commands without difficulty.  Cranial Nerves:  II:  Peripheral visual fields grossly normal, pupils equal, round, reactive to light III,IV, VI: ptosis not present, extra-ocular motions intact bilaterally  V,VII: smile symmetric, facial light touch sensation equal VIII: hearing grossly normal bilaterally  IX,X: midline uvula rise  XI: bilateral shoulder shrug equal and strong XII: midline tongue extension  Motor:  5/5 in upper and lower extremities bilaterally including strong and equal grip strength and dorsiflexion/plantar flexion Sensory: Pinprick and light touch normal in all extremities.  Deep Tendon Reflexes: 2+ and symmetric  Cerebellar: normal finger-to-nose with bilateral upper extremities Gait: normal gait and balance CV: distal pulses palpable throughout   Skin: Skin is warm and dry. No rash noted. Pt is not diaphoretic.  Psychiatric: Pt has a normal mood and affect. Behavior is normal. Judgment and thought content normal.  Nursing note and vitals reviewed.  ED Results / Procedures / Treatments   Labs (all labs ordered are listed, but  only abnormal results are displayed) Labs Reviewed  BASIC METABOLIC PANEL - Abnormal; Notable for the following components:      Result Value   Sodium 134 (*)    Glucose, Bld 107 (*)    All other components within normal limits  CBC - Abnormal; Notable for the following components:   RBC 5.15 (*)    MCV 79.6 (*)    All other components within normal limits  PREGNANCY, URINE  TSH  T3, FREE  T4, FREE  RAPID URINE DRUG SCREEN, HOSP PERFORMED  TROPONIN I (HIGH SENSITIVITY)  TROPONIN I (HIGH SENSITIVITY)    EKG EKG Interpretation  Date/Time:  Monday January 26 2023 13:58:10 EDT Ventricular Rate:  96 PR Interval:  158 QRS Duration: 83 QT Interval:  343 QTC Calculation: 434 R Axis:   22 Text Interpretation: Sinus rhythm Low voltage, precordial leads Consider anterior infarct no prior ECG for comparison. no STEMI Confirmed by Theda Belfast ([id number]) on 01/26/2023 3:11:11 PM  Radiology DG Chest 2 View  Result Date: 01/26/2023 CLINICAL DATA:  Chest pain EXAM: CHEST - 2 VIEW COMPARISON:  10/31/2020 FINDINGS: Cardiac and mediastinal contours are within normal limits. No focal pulmonary opacity. No pleural effusion  or pneumothorax. No acute osseous abnormality. IMPRESSION: No acute cardiopulmonary process. Electronically Signed   By: Wiliam Ke M.D.   On: 01/26/2023 14:08    Procedures Procedures    Medications Ordered in ED Medications - No data to display  ED Course/ Medical Decision Making/ A&P                             Medical Decision Making Here with hyper tension and palpitations. The differential diagnosis for palpitations includes cardiac arrhythmias, PVC/PAC, ACS, Cardiomyopathy, CHF, MVP, pericarditis, valvular disease, Panic/Anxiety, Somatic disorder, ETOH, Caffeine,  Stimulant use, medication side effect, Anemia, Hyperthyroidism, pulmonary embolism.  I reviewed labs there are no acute findings.  Troponin within normal limits.  I ordered and reviewed two-view  chest x-ray which showed no acute findings.   EKG Interpretation  Date/Time:  Monday January 26 2023 13:58:10 EDT Ventricular Rate:  96 PR Interval:  158 QRS Duration: 83 QT Interval:  343 QTC Calculation: 434 R Axis:   22 Text Interpretation: Sinus rhythm Low voltage, precordial leads    Patient is PERC negative   Headache and blood pressure are completely resolved after fluids, Reglan, Benadryl and Toradol.  She has thyroid panel pending and may follow-up with her primary care doctor for this.  She has a follow-up appointment on Thursday.       Amount and/or Complexity of Data Reviewed Labs: ordered. Radiology: ordered.  Risk Prescription drug management.           Final Clinical Impression(s) / ED Diagnoses Final diagnoses:  None    Rx / DC Orders ED Discharge Orders     None         Arthor Captain, PA-C 01/26/23 2121    Tegeler, Canary Brim, MD 01/26/23 2233

## 2023-01-26 NOTE — ED Triage Notes (Signed)
Pt states noticed Friday heart felt like it was skipping and BP was going up, states slight chest tightness and headache as well  BP 170/110 at home  Has appointment on Thursday with pcp  Took BP meds today

## 2023-01-26 NOTE — Discharge Instructions (Signed)
Ask your primary care doctor you might take a beta-blocker instead of nifedipine as this can be used for prevention of migraines, treatment of palpitations and high blood pressure.  Get help right away if you: Develop a severe headache or confusion. Have unusual weakness or numbness. Feel faint. Have severe pain in your chest or abdomen. Vomit repeatedly. Have trouble breathing. These symptoms may be an emergency. Get help right away. Call 911. Do not wait to see if the symptoms will go away. Do not drive yourself to the hospital.

## 2023-01-27 LAB — T4, FREE: Free T4: 0.86 ng/dL (ref 0.61–1.12)

## 2023-01-27 LAB — TSH: TSH: 1.369 u[IU]/mL (ref 0.350–4.500)

## 2023-01-28 LAB — T3, FREE: T3, Free: 3.5 pg/mL (ref 2.0–4.4)
